# Patient Record
Sex: Male | Born: 1960 | Race: Asian | Hispanic: No | Marital: Married | Smoking: Never smoker
Health system: Western US, Academic
[De-identification: ages and names within clinical notes are randomized; demographics above are authoritative.]

## PROBLEM LIST (undated history)

## (undated) DIAGNOSIS — I209 Angina pectoris, unspecified: Secondary | ICD-10-CM

## (undated) DIAGNOSIS — H547 Unspecified visual loss: Secondary | ICD-10-CM

## (undated) DIAGNOSIS — R519 Headache, unspecified: Secondary | ICD-10-CM

## (undated) DIAGNOSIS — G4730 Sleep apnea, unspecified: Secondary | ICD-10-CM

## (undated) DIAGNOSIS — I639 Cerebral infarction, unspecified: Secondary | ICD-10-CM

## (undated) DIAGNOSIS — E785 Hyperlipidemia, unspecified: Secondary | ICD-10-CM

## (undated) DIAGNOSIS — R42 Dizziness and giddiness: Secondary | ICD-10-CM

## (undated) DIAGNOSIS — Z8673 Personal history of transient ischemic attack (TIA), and cerebral infarction without residual deficits: Secondary | ICD-10-CM

## (undated) DIAGNOSIS — E119 Type 2 diabetes mellitus without complications: Secondary | ICD-10-CM

## (undated) DIAGNOSIS — I10 Essential (primary) hypertension: Secondary | ICD-10-CM

## (undated) HISTORY — DX: Essential (primary) hypertension: I10

## (undated) HISTORY — DX: Sleep apnea, unspecified: G47.30

## (undated) HISTORY — PX: OTHER PROCEDURE: U1053

## (undated) HISTORY — DX: Dizziness and giddiness: R42

## (undated) HISTORY — DX: Personal history of transient ischemic attack (TIA), and cerebral infarction without residual deficits: Z86.73

## (undated) HISTORY — DX: Angina pectoris, unspecified (CMS-HCC): I20.9

## (undated) HISTORY — DX: Hyperlipidemia, unspecified: E78.5

## (undated) HISTORY — DX: Type 2 diabetes mellitus without complications (CMS-HCC): E11.9

## (undated) HISTORY — DX: Cerebral infarction, unspecified (CMS-HCC): I63.9

## (undated) HISTORY — DX: Unspecified visual loss: H54.7

## (undated) HISTORY — DX: Headache, unspecified: R51.9

## (undated) MED ORDER — PERFLUTREN LIPID MICROSPHERE 1.1 MG/ML IV SUSP
0.40 mL | Freq: Once | INTRAVENOUS | Status: AC
Start: 2021-03-13 — End: 2021-03-13

## (undated) MED ORDER — DEXTROSE-NACL 5-0.9 % IV SOLN (CUSTOM)
INTRAVENOUS | Status: AC
Start: 2021-03-27 — End: ?

## (undated) MED ORDER — HYDRALAZINE HCL 100 MG OR TABS
100.0000 mg | ORAL_TABLET | Freq: Three times a day (TID) | ORAL | 3 refills | Status: AC
Start: 2021-06-19 — End: 2022-06-14

## (undated) MED ORDER — METOPROLOL SUCCINATE 50 MG OR TB24
50.0000 mg | ORAL_TABLET | Freq: Every day | ORAL | 3 refills | Status: AC
Start: 2021-04-17 — End: ?

## (undated) MED ORDER — SODIUM CHLORIDE FLUSH 0.9 % IV SOLN
10.00 mL | Freq: Once | INTRAVENOUS | Status: AC
Start: 2021-03-13 — End: 2021-03-13

## (undated) MED ORDER — CLONIDINE HCL 0.1 MG OR TABS
0.1000 mg | ORAL_TABLET | Freq: Every day | ORAL | 3 refills | Status: AC
Start: 2021-06-19 — End: 2022-06-14

## (undated) MED ORDER — DEXTROSE-NACL 5-0.45 % IV SOLN (CUSTOM)
INTRAVENOUS | Status: AC
Start: 2021-03-27 — End: ?

---

## 2020-11-06 ENCOUNTER — Inpatient Hospital Stay: Admit: 2020-11-06 | Discharge: 2020-11-06 | Disposition: A | Discharge status: Home Health | Payer: Self-pay

## 2020-12-19 NOTE — Progress Notes (Signed)
Endocrine Surgery    New Patient Consultation      Patient Name: Keith Cross   MRN: [id number]  Date of Evaluation: 12/21/2020   Referring Provider: Dr. Glenford Cross (PCP)    [name] translation provided by Keith Cross, Keith Cross    Subjective    Reason For Visit: Left adrenal mass, poorly controlled hypertension    Chief Complaint: Left adrenal mass    History of Present Illness:  Keith Cross is a 60 year old male with PMH significant for hypertension, hyperlipidemia, Type 2 DM, hx of CVA in 2015.    Presents for evaluation of a left adrenal mass. Noted by his PCP to have very poorly controlled HTN. CTAP demonstrated a 0.9 cm left adrenal mass that appears c/w benign adenoma. Biochemical workup concerning for possible pheochromocytoma, with plasma normetanephrines 252 (normal < 148) and metanephrines 63 (< 57). Aldosterone level 8.     Current BP regimen per PCP office note includes:  Clonidine 0.1 mg TID  Coreg 25 mg BID  Hydralazine 50 mg TID  Isosorbide mononitrate 30 mg daily  Lasix 40 mg daily  Losartan 100 mg daily  Metoprolol ER 50 mg daily  Norvasc 5 mg daily    Patient appears to be a very poor historian and is unaccompanied for the visit. He states he is only on 5 medications total, despite a much more extensive medication list per his PCP note. He reports a history of intermittent chest pain, shortness of breath, palpitations, headaches, sweating. Denies abdominal pain.    He also reports a history of a thyroid nodule that was diagnosed when he lived in [place] He states he was given medication but since moving to the [place], his thyroid has not been checked and he is not on any medication.      Review of Systems:  A complete 14 point review of systems is negative other than as stated in the HPI    Past Medical History:   Past Medical History:   Diagnosis Date   . History of CVA (cerebrovascular accident)    . Hyperlipidemia    . Hypertension    . Type 2 diabetes mellitus (CMS-HCC)        Past Surgical  History: *  Past Surgical History:   Procedure Laterality Date   . Left knee surgery         Adverse Drugs Reactions: No Known Allergies    Home Medications:  Current Outpatient Medications   Medication Sig   . dexAMETHasone (DECADRON) 1 MG tablet Take 1 tablet (1 mg) by mouth once for 1 dose. Please take 1 tablet (1 mg) by mouth at 11 pm on the evening before checking cortisol level   . losartan (COZAAR) 25 MG tablet Take 25 mg by mouth daily.   . metoprolol succinate (TOPROL XL) 50 MG XL tablet Take 50 mg by mouth daily.     No current facility-administered medications for this visit.       Social History:  - ETOH: denies  - Tobacco use: denies  - Recreational drug use: denies     Family History:  - Non contributory      Objective   VITAL SIGNS:  BP (!) 181/122 (BP Location: Right arm, BP Patient Position: Sitting, BP cuff size: Regular)   Pulse 79   Temp 97.1 F (36.2 C) (Tympanic)   Resp 18   Ht 5' 6.93" (1.7 m)   Wt 81.2 kg (179 lb 0.2 oz)  BMI 28.10 kg/m   Body mass index is 28.1 kg/m.    Physical Exam   GEN: no apparent distress, responds appropriately  HEENT: mucous membranes moist, no scleral icterus  NECK: no JVD, trachea midline  RESPIRATORY: No increased work of breathing, regular rate  CV: Normal rate, regular rhythm  ABDOMEN: Soft, non-distended, nontender to palpation without rebound tenderness or guarding.  MSK: Warm, no lower extremity edema. UE/LE distal pulses intact bilaterally   SKIN: No rashes, ecchymosis, or skin lesions  NEURO: Alert and oriented to person, place, time, and events.   PSYCH: Normal affect and mood      RELEVANT DIAGNOSTICS  LAB RESULTS    Labs from 10/15/20 (results scanned in Media)    Plasma metanephrines 63 (< 57)  Plasma normetanephrines 252 (< 140)    Aldosterone 8  Potassium 4.1      IMAGING     CTAP Adrenal Protocol 11/06/20 at [facility] MRI (report scanned in Media)    Within the left adrenal gland at the junction of the medial and lateral limbs there is 1 cm oval  nodule with Hounsfield units of 28 on the precontrast sequence image 62/85. ON the portal venous phase this nodule has Hounsfield  Units of 66 on image 58/210. This has Hounsfield units of 38 on the 15 minute delayed sequence image 148/210. The absolute washout percentage within this nodule is 74% and the relative contrast washout percentage is 42%. These washout measurements are consistent with a benign left adrenal adenoma.    The remainder of the left adrenal gland is normal.    The right adrenal gland is normal.    There is mild diffuse fatty liver. There is no suspicious contrast enhancing liver lesion.    There is a 0.4 cm nonobstructing right upper pole renal calculus.     There are no enlarged abdominal or retroperitoneal lymph nodes.    Impression:  1. There is a 0.9 cm benign left adrenal adenoma  2. Fatty liver  3. There is a 0.4 cm non-obstructing right renal calculus      PATHOLOGY     N/A    Other labs and imaging reviewed. Pertinents mentioned in assessment/plan  Assessment/Plan:   In summary, Keith Cross is a 60 year old male with PMH significant for hypertension, hyperlipidemia, Type 2 DM, hx of CVA in 2015.    Presents for evaluation of a left adrenal mass concerning for pheochromocytoma in the context of poorly controlled hypertension. Unclear at this point which antihypertensives the patient is taking.      PLAN  - Order repeat plasma metanephrines/normetanephrines and catecholamines, along with 24 hour urine metanephrines and catecholamines  - Complete adrenal biochemical workup with plasma renin, aldosterone and 1 mg dexamethasone suppression test  - Will order thyroid US and thyroid function tests to assess for thyroid nodule per his reported history  - Check calcium/PTH  - Referral to endocrinology to assist with workup  - May require referral to cardiology due to poorly controlled hypertension  - Asked patient to please follow up with PCP to verify which blood pressure medications he should  be taking  - Follow up with me in 1 month after all tests have been completed    ____________________  Keith Fermo, MD, FACS  Assistant Clinical Professor of Surgery  [facility] [employer] [facility]

## 2020-12-21 ENCOUNTER — Ambulatory Visit: Payer: BC Managed Care – PPO | Attending: Surgery | Admitting: Surgery

## 2020-12-21 ENCOUNTER — Other Ambulatory Visit: Payer: Self-pay | Admitting: Surgery

## 2020-12-21 ENCOUNTER — Encounter: Payer: Self-pay | Admitting: Surgery

## 2020-12-21 VITALS — BP 181/122 | HR 79 | Temp 97.1°F | Resp 18 | Ht 66.93 in | Wt 179.0 lb

## 2020-12-21 DIAGNOSIS — Z6828 Body mass index (BMI) 28.0-28.9, adult: Secondary | ICD-10-CM

## 2020-12-21 DIAGNOSIS — E278 Other specified disorders of adrenal gland: Secondary | ICD-10-CM | POA: Insufficient documentation

## 2020-12-21 DIAGNOSIS — E041 Nontoxic single thyroid nodule: Secondary | ICD-10-CM | POA: Insufficient documentation

## 2020-12-21 MED ORDER — METOPROLOL SUCCINATE 50 MG OR TB24
50.00 mg | ORAL_TABLET | Freq: Every day | ORAL | Status: DC
Start: 2020-11-20 — End: 2021-03-13

## 2020-12-21 MED ORDER — DEXAMETHASONE 1 MG OR TABS
1.0000 mg | ORAL_TABLET | Freq: Once | ORAL | 0 refills | Status: AC
Start: 2020-12-21 — End: 2020-12-21

## 2020-12-21 MED ORDER — LOSARTAN POTASSIUM 25 MG OR TABS
25.00 mg | ORAL_TABLET | Freq: Every day | ORAL | Status: DC
Start: 2020-11-29 — End: 2021-01-23

## 2020-12-24 ENCOUNTER — Other Ambulatory Visit
Admission: RE | Admit: 2020-12-24 | Discharge: 2020-12-24 | Disposition: A | Discharge status: Home / Self Care | Payer: BC Managed Care – PPO | Attending: Surgery | Admitting: Surgery

## 2020-12-24 DIAGNOSIS — E278 Other specified disorders of adrenal gland: Secondary | ICD-10-CM | POA: Insufficient documentation

## 2020-12-24 LAB — METANEPHRINES FRACTIONATED, TIMED URINE
Metanephrine Urine Hours Collected (Hr): 24
Metanephrine Urine Total Volume: 1970

## 2020-12-24 LAB — CATECHOLAMINES PANEL, TIMED URINE FREE

## 2020-12-25 ENCOUNTER — Other Ambulatory Visit
Admission: RE | Admit: 2020-12-25 | Discharge: 2020-12-25 | Disposition: A | Discharge status: Home / Self Care | Payer: BC Managed Care – PPO | Attending: Surgery | Admitting: Surgery

## 2020-12-25 DIAGNOSIS — E278 Other specified disorders of adrenal gland: Secondary | ICD-10-CM | POA: Insufficient documentation

## 2020-12-25 DIAGNOSIS — E041 Nontoxic single thyroid nodule: Secondary | ICD-10-CM | POA: Insufficient documentation

## 2020-12-25 LAB — CBC WITH DIFF, BLOOD
ANC automated: 6.6 10*3/uL (ref 2.0–8.1)
Basophils %: 0.5 %
Basophils Absolute: 0 10*3/uL (ref 0.0–0.2)
Eosinophils %: 0.2 %
Eosinophils Absolute: 0 10*3/uL (ref 0.0–0.5)
Hematocrit: 48.4 % (ref 39.5–50.0)
Hgb: 16.2 G/DL (ref 13.5–16.9)
Lymphocytes %: 12.9 %
Lymphocytes Absolute: 1 10*3/uL (ref 0.9–3.3)
MCH: 28.8 PG (ref 27.0–33.5)
MCHC: 33.4 G/DL (ref 32.0–35.5)
MCV: 86 FL (ref 81.5–97.0)
MPV: 7.8 FL (ref 7.2–11.7)
Monocytes %: 2.4 %
Monocytes Absolute: 0.2 10*3/uL (ref 0.0–0.8)
Neutrophils % (A): 84 %
PLT Count: 247 10*3/uL (ref 150–400)
RBC: 5.63 10*6/uL — ABNORMAL HIGH (ref 4.38–5.62)
RDW-CV: 14.3 % (ref 11.6–14.4)
White Bld Cell Count: 7.8 10*3/uL (ref 4.0–10.5)

## 2020-12-25 LAB — BASIC METABOLIC PANEL, BLOOD
BUN: 18 mg/dL (ref 7–25)
CO2: 23 mmol/L (ref 21–31)
Calcium: 8.9 mg/dL (ref 8.6–10.3)
Chloride: 104 mmol/L (ref 98–107)
Creat: 1.1 mg/dL (ref 0.7–1.3)
Electrolyte Balance: 10 mmol/L (ref 2–12)
Glucose: 146 mg/dL — ABNORMAL HIGH (ref 85–125)
Potassium: 4.1 mmol/L (ref 3.5–5.1)
Sodium: 137 mmol/L (ref 136–145)
eGFR - high estimate: >60 (ref >59)
eGFR - low estimate: >60 (ref >59)

## 2020-12-25 LAB — PTH INTACT, BLOOD: PTH, Intact: 67 pg/mL (ref 10–68)

## 2020-12-25 LAB — CORTISOL, BLOOD: Cortisol: 1 ug/dL

## 2020-12-25 LAB — THYROID FUNCTION PANEL (ULTRASENSITIVE TSH + FREE T4)
Free T4: 0.95 ng/dL (ref 0.60–1.12)
TSH, Ultrasensitive: 0.493 u[IU]/mL (ref 0.450–4.120)

## 2020-12-26 LAB — CATECHOLAMINES PANEL, TIMED URINE FREE
Catechol Fract UR Creat mg/Day: 1911 mg/d (ref 800–2100)
Catechol Fract UR Dopamine Vol ug/L: 168 ug/L
Catechol Fract UR Dopamine ug/Day: 331 ug/d (ref 71–485)
Catechol Fract UR Dopamine ug/g Creat: 173 ug/g CRT (ref 0–250)
Catechol Fract UR Epineph Vol ug/L: 3 ug/L
Catechol Fract UR Epineph ug/Day: 6 ug/d (ref 1–14)
Catechol Fract UR Epineph ug/g Creat: 3 ug/g CRT (ref 0–20)
Catechol Fract UR Time, Hr: 24 hr
Catechol Fract Urine Total Volume, ml: 1970 mL
Norepinephrine Urine Volume (ug/L): 46 ug/L
Norepinephrine Urine ug/Day: 91 ug/d (ref 14–120)

## 2020-12-27 ENCOUNTER — Telehealth: Payer: Self-pay | Admitting: Surgery

## 2020-12-27 LAB — METANEPHRINES FRACTIONATED, TIMED URINE
Metanephrine Urine per 24 hr: 173 ug/d (ref 55–320)
Metanephrine Urine per Volume: 88 ug/L
Metanephrine/Creatinine Ratio: 91 ug/g CRT (ref 0–300)
Normetanephrine Urine per Vol: 294 ug/L
Normetanephrine/Creatinine Ratio: 303 ug/g CRT (ref 0–400)

## 2020-12-27 NOTE — Telephone Encounter (Signed)
Pt's daughter states she would like a call regarding pt's consultation, states pt didn't completely understand everything so she would like to speak with nurse, please call to further assist, thank you.

## 2020-12-28 ENCOUNTER — Telehealth: Payer: Self-pay | Admitting: Surgery

## 2020-12-28 LAB — METANEPHRINES, PLASMA
Metanephrine,Free: 0.36 nmol/L (ref <0.50)
Normetanephrine,Free: 0.98 nmol/L — ABNORMAL HIGH (ref <0.90)

## 2020-12-28 LAB — ALDOSTERONE, BLOOD: Aldosterone: 5 ng/dL

## 2020-12-28 LAB — RENIN, BLOOD: Renin: 0.8 ng/mL/hr

## 2020-12-28 NOTE — Telephone Encounter (Signed)
Called back and talked to pt's daughter, Sonia Baller.  Let her know that pt will need thyroid u/s and an appt with Endocrinologist.  She said already has an appt with Endo.  Gave phone number to call for u/s appt.  She wants to sign up to My Chart.  Resend the activation code.  Answered all questions and concerned.  vp

## 2020-12-28 NOTE — Telephone Encounter (Signed)
Caller states they are returning Van's call. Please call back to assist. Thanks

## 2020-12-28 NOTE — Telephone Encounter (Signed)
Left message on pt's daughter voice mail to call office back again.  vp

## 2020-12-30 LAB — MISCELLANEOUS TEST (ARUP): ARUP Test Code: 2003248

## 2020-12-30 LAB — CATECHOLAMINES, BLOOD
Catechol Dopamine: <20 pg/mL (ref 0–20)
Catechol Epinephrine: 50 pg/mL (ref 10–200)
Catechol Norepinephrine: 748 pg/mL — ABNORMAL HIGH (ref 80–520)

## 2021-01-15 ENCOUNTER — Telehealth: Payer: Self-pay | Admitting: Surgery

## 2021-01-15 NOTE — Telephone Encounter (Signed)
Called and left message on voice mail for pt. Will need to r/s this Friday appt with Dr Ronny Bacon to next week.  Dr Ronny Bacon needs to see the thyroid u/s result prior to see pt.  vp

## 2021-01-18 ENCOUNTER — Ambulatory Visit: Payer: BC Managed Care – PPO | Admitting: Surgery

## 2021-01-20 ENCOUNTER — Ambulatory Visit
Admission: RE | Admit: 2021-01-20 | Discharge: 2021-01-20 | Disposition: A | Discharge status: Home / Self Care | Payer: BC Managed Care – PPO | Attending: Surgery | Admitting: Surgery

## 2021-01-20 DIAGNOSIS — E041 Nontoxic single thyroid nodule: Secondary | ICD-10-CM

## 2021-01-22 NOTE — Progress Notes (Signed)
Endocrine Surgery Follow Up Visit    Visit Date:  01/23/2021      Keith Cross, LVN is present to assist with [name] interpretation      HPI:  Keith Cross is a 60 year old year old male with PMH significant for hypertension, hyperlipidemia, Type 2 DM, hx of CVA in 2015.    Presents for follow up of a left adrenal mass in the context of poorly controlled hypertension. CTAP demonstrated a 0.9 cm left adrenal mass that appears c/w benign adenoma. Biochemical workup concerning for possible pheochromocytoma, with plasma normetanephrines 252 (normal < 148) and metanephrines 63 (< 57). Aldosterone level 8.     Current BP regimen per PCP office note includes:  Clonidine 0.1 mg TID  Coreg 25 mg BID  Hydralazine 50 mg TID  Isosorbide mononitrate 30 mg daily  Lasix 40 mg daily  Losartan 100 mg daily  Metoprolol ER 50 mg daily  Norvasc 5 mg daily    Patient is a very poor historian with poor recollection of the medications he is taking.    Complete biochemical workup ordered by me as follows:    Aldosterone  5  Renin   0.8  Aldo:renin ratio 6.25    '1mg'$  dexamethasone suppression  8am cortisol  1  Dexamethasone level 457 (140-295)    Plasma metenephrines 0.36 (< 0.5)  Plasma normetanephrines 0.98 (< 0.9)  Plasma norepinephrine 748 (80-520)    24 hour urine metanephrines 173 (55-320)  24 hour urine normetanephrines 579 (341-937)      Physical Exam:  Vital Signs:  BP (!) 188/113 (BP Location: Left arm, BP Patient Position: Sitting, BP cuff size: Regular)   Pulse 70   Temp 95.7 F (35.4 C) (Tympanic)   Resp 17   Ht 5' 6.93" (1.7 m)   Wt 81.4 kg (179 lb 7.3 oz)   BMI 28.17 kg/m     General: no acute distress  CV: RRR  Pulm: non-labored breathing  Abd: soft, NT/ND  Neuro: no focal deficits      Labs:       Ref. Range 12/25/2020 07:32   Free T4 Latest Ref Range: 0.60 - 1.12 ng/dL 0.95   TSH Latest Ref Range: 0.450 - 4.120 uIU/mL 0.493   Aldosterone, Blood Latest Units: ng/dL 5.0   Cortisol Latest Units: MCG/DL 1   Epinephrine  Latest Ref Range: 10 - 200 pg/mL 50   Metanephrine,Free Latest Ref Range: <0.50 nmol/L 0.36   Norepinephrine Latest Ref Range: 80 - 520 pg/mL 748 (H)   Normetanephrine,Free Latest Ref Range: <0.90 nmol/L 0.98 (H)   PTH Intact Latest Ref Range: 10 - 68 pg/mL 67   Renin Latest Units: ng/mL/hr 0.8     Comment: (NOTE)   Test name          Result Flag Units RefIntvl   ------------------------------------------------------------   Dexamethasone, Serum or Plasma, LC-MS/MS                 457.5    ng/dL        INTERPRETIVE INFORMATION: Dexamethasone, Serum or Plasma by                 LC-MS/MS   Adults baseline: Less than 50 ng/dL   8:00 AM draw following 1 mg dexamethasone between 11:00 pm   and 12:00 am the previous evening: 140 - 295 ng/dL   8:00 AM draw following 8 mg dexamethasone (4 x 2 mg doses)   between 11:00 pm and  12:00 am the previous evening: 1600 -   2850 ng/dL         Ref. Range 12/24/2020 09:30   Metanephrine Per Volume Latest Units: ug/L 88   Metanephrine Per 24H Latest Ref Range: 55 - 320 ug/d 173   Normetanephrine Per Volume Latest Units: ug/L 294   Normetanephrine Per 24H Latest Ref Range: 114 - 865 ug/d 579   Creatinine, Urine - per 24hr Latest Ref Range: 800 - 2,100 mg/d 1,911          Ref. Range 12/25/2020 07:32   Sodium Latest Ref Range: 136 - 145 mmol/L 137   Potassium Latest Ref Range: 3.5 - 5.1 mmol/L 4.1   Chloride Latest Ref Range: 98 - 107 mmol/L 104   CO2 Latest Ref Range: 21 - 31 mmol/L 23   Anion Gap Latest Ref Range: 2 - 12 mmol/L 10   BUN Latest Ref Range: 7 - 25 mg/dL 18   Creatinine Latest Ref Range: 0.7 - 1.3 mg/dL 1.1   GFR Latest Ref Range: >59  >60   eGFR - high estimate Latest Ref Range: >59  >60   Glucose Latest Ref Range: 85 - 125 mg/dL 146 (H)   Calcium Latest Ref Range: 8.6 - 10.3 mg/dL 8.9       Imaging:      CTAP Adrenal Protocol 11/06/20 at [facility] MRI (report scanned in Media)    Within the left adrenal gland at the junction of the  medial and lateral limbs there is 1 cm oval nodule with Hounsfield units of 28 on the precontrast sequence image 62/85. ON the portal venous phase this nodule has Hounsfield  Units of 66 on image 58/210. This has Hounsfield units of 38 on the 15 minute delayed sequence image 148/210. The absolute washout percentage within this nodule is 74% and the relative contrast washout percentage is 42%. These washout measurements are consistent with a benign left adrenal adenoma.    The remainder of the left adrenal gland is normal.    The right adrenal gland is normal.    There is mild diffuse fatty liver. There is no suspicious contrast enhancing liver lesion.    There is a 0.4 cm nonobstructing right upper pole renal calculus.     There are no enlarged abdominal or retroperitoneal lymph nodes.    Impression:    1. There is a 0.9 cm benign left adrenal adenoma  2. Fatty liver  3. There is a 0.4 cm non-obstructing right renal calculus      Assessment:  Keith Cross is a 60 year old year old male PMH significant for hypertension, hyperlipidemia, Type 2 DM, hx of CVA in 2015.    Presents for follow up of a left adrenal mass in the context of poorly controlled hypertension. CTAP with a 0.9 cm benign left adrenal adenoma.    Initial concern for pheochromocytoma given mild elevation of plasma metanephrines and normetanephrines. Repeat biochemical workup does not demonstrate any evidence of adrenal hypersecretion at this time.    Aldo:renin ratio 6.25 (< 30)  $Re'1mg'CXj$  dexamethasone suppression 8 am cortisol: 1  Plasma normetaneprhines very mildly elevated at 0.98 (normal < 0.9)  Plasma metaneprhines 0.36 (< 0.5)    24 hour urine metanephrines and normetaneprhines WNL      Plan:    1. Labs reviewed with patient  2. At this time, there does not appear to be evidence of adrenal hypersecretion   3. Given sub-centimeter size of adrenal nodule and lack  of excess hormone secretion, currently no surgical indication for  adrenalectomy  4. Follow up scheduled with Keith Cross of endocrinology tomorrow, would likely require yearly biochemical workup/imaging for continued surveillance of adrenal nodule  5. Refer to cardiology to resistant hypertension  6. Follow up with me in endocrine surgery PRN      Gavriel Holzhauer L. Ronny Bacon, MD  Assistant Clinical Professor of [employer] [facility]

## 2021-01-23 ENCOUNTER — Encounter: Payer: Self-pay | Admitting: Surgery

## 2021-01-23 ENCOUNTER — Ambulatory Visit: Payer: BC Managed Care – PPO | Attending: Surgery | Admitting: Surgery

## 2021-01-23 VITALS — BP 188/113 | HR 70 | Temp 95.7°F | Resp 17 | Ht 66.93 in | Wt 179.5 lb

## 2021-01-23 DIAGNOSIS — I10 Essential (primary) hypertension: Secondary | ICD-10-CM | POA: Insufficient documentation

## 2021-01-23 DIAGNOSIS — E278 Other specified disorders of adrenal gland: Secondary | ICD-10-CM | POA: Insufficient documentation

## 2021-01-23 DIAGNOSIS — Z6828 Body mass index (BMI) 28.0-28.9, adult: Secondary | ICD-10-CM

## 2021-01-23 MED ORDER — AMLODIPINE 5 MG OR TABS
5.00 mg | ORAL_TABLET | Freq: Every day | ORAL | Status: DC
Start: ? — End: 2021-03-13

## 2021-01-23 MED ORDER — PIOGLITAZONE HCL 30 MG OR TABS
30.00 mg | ORAL_TABLET | Freq: Every day | ORAL | Status: DC
Start: ? — End: 2021-04-11

## 2021-01-23 MED ORDER — ASPIRIN 81 MG OR TBEC: 81.00 mg | DELAYED_RELEASE_TABLET | Freq: Every day | ORAL | Status: AC

## 2021-01-23 MED ORDER — LOSARTAN POTASSIUM 100 MG OR TABS
100.00 mg | ORAL_TABLET | Freq: Every day | ORAL | Status: DC
Start: ? — End: 2021-06-04

## 2021-01-23 MED ORDER — FUROSEMIDE 40 MG OR TABS: 40.00 mg | ORAL_TABLET | Freq: Every day | ORAL | Status: AC

## 2021-01-23 MED ORDER — POTASSIUM CHLORIDE CR 10 MEQ OR TBCR
20.00 meq | EXTENDED_RELEASE_TABLET | Freq: Every day | ORAL | Status: DC
Start: ? — End: 2021-03-28

## 2021-01-23 MED ORDER — ATORVASTATIN CALCIUM 40 MG OR TABS
40.00 mg | ORAL_TABLET | Freq: Every day | ORAL | Status: DC
Start: ? — End: 2021-03-28

## 2021-01-23 MED ORDER — ISOSORBIDE MONONITRATE CR 30 MG OR TB24
30.00 mg | ORAL_TABLET | Freq: Every morning | ORAL | Status: DC
Start: ? — End: 2021-04-11

## 2021-01-23 MED ORDER — COLCHICINE 0.6 MG OR TABS
0.60 mg | ORAL_TABLET | Freq: Two times a day (BID) | ORAL | Status: DC
Start: ? — End: 2022-01-13

## 2021-01-23 NOTE — Addendum Note (Signed)
Addended by: Kemper Durie L on: 01/23/2021 10:58 AM     Modules accepted: Orders

## 2021-01-24 ENCOUNTER — Ambulatory Visit
Payer: BC Managed Care – PPO | Attending: Surgery | Admitting: Student in an Organized Health Care Education/Training Program

## 2021-01-24 ENCOUNTER — Other Ambulatory Visit
Admission: RE | Admit: 2021-01-24 | Discharge: 2021-01-24 | Disposition: A | Discharge status: Home Health | Payer: BC Managed Care – PPO | Source: Ambulatory Visit

## 2021-01-24 ENCOUNTER — Encounter: Payer: Self-pay | Admitting: Student in an Organized Health Care Education/Training Program

## 2021-01-24 VITALS — BP 181/108 | HR 83 | Resp 16 | Ht 67.0 in | Wt 177.5 lb

## 2021-01-24 DIAGNOSIS — R7989 Other specified abnormal findings of blood chemistry: Secondary | ICD-10-CM

## 2021-01-24 DIAGNOSIS — Z6827 Body mass index (BMI) 27.0-27.9, adult: Secondary | ICD-10-CM | POA: Insufficient documentation

## 2021-01-24 DIAGNOSIS — E119 Type 2 diabetes mellitus without complications: Secondary | ICD-10-CM

## 2021-01-24 DIAGNOSIS — R519 Headache, unspecified: Secondary | ICD-10-CM

## 2021-01-24 DIAGNOSIS — G8929 Other chronic pain: Secondary | ICD-10-CM

## 2021-01-24 DIAGNOSIS — D3500 Benign neoplasm of unspecified adrenal gland: Secondary | ICD-10-CM | POA: Insufficient documentation

## 2021-01-24 NOTE — Consults (Signed)
ENDOCRINOLOGY OUTPATIENT VISIT - [facility]    Date and Time of Evaluation:  Thursday January 24, 2021    Primary Care Physician:  Keith Cross [facility]     Chief Complaint:   Chief Complaint   Patient presents with   . New Patient       History of Present Illness:    Keith Cross is a 60 year old male with CVA 2015, HTN, HLD, and T2DM, here for evaluation and management of adrenal nodule.    1. Adrenal nodule.    Subcentimeter adrenal nodule (0.9 cm) on 10/2020 CTAP with 28 HU and absolute/relative contrast washout of 74%/42%, c/w benign adrenal adenoma.     Was seen by Dr. Inetta Cross of Endocrine Surgery given history of uncontrolled hypertension and previous hormonal w/up with elevated plasma metanephrines 62 (< 57) and normetanephrines 252 (< 148), though less than 2x ULN. Aldosterone level was normal at 8. Repeat labs with Dr. Ronny Cross with unremarkable plasma metanephrines (normetanephrines very minimally elevated at 0.98, normal < 0.9). 24-hour urine metanephrines were wnl. Aldosterone/renin wnl with normal dexamethasone suppression test.      Currently on multiple hypertensive agents, including clonidine, carvedilol, hydralazine, isosorbide mononitrate, amlodipine, losartan, metoprolol, and furosemide. BP 181/108 in clinic today.     Patient does endorse headaches in the morning and late evening. He does not have a Neurologist. Also endorses episodes of palpitations, diaphoresis, SOB, and chest pain. Advised patient he needs to present to ER if he has these symptoms. Has Cardiology referral. Reports diarrhea with mucus in stool once every few weeks.     Also had thyroid US, read pending.      Past Medical/Surgical History:  Past Medical History:   Diagnosis Date   . History of CVA (cerebrovascular accident)    . Hyperlipidemia    . Hypertension    . Type 2 diabetes mellitus (CMS-HCC)        Family History:  No family history on file.    Social History:  Social History     Tobacco Use   . Smoking status: Never  Smoker   . Smokeless tobacco: Never Used   Substance Use Topics   . Alcohol use: Not on file   . Drug use: Not on file       Allergies:  No Known Allergies    Medications:  Current Outpatient Medications on File Prior to Visit   Medication Sig Dispense Refill   . amLODIPINE (NORVASC) 5 MG tablet Take 5 mg by mouth daily.     Marland Kitchen aspirin 81 MG EC tablet Take 81 mg by mouth daily.     Marland Kitchen atorvastatin (LIPITOR) 40 MG tablet Take 40 mg by mouth daily.     . colchicine 0.6 MG tablet Take 0.6 mg by mouth 2 times daily.     . furosemide (LASIX) 40 MG tablet Take 40 mg by mouth daily.     . isosorbide mononitrate (IMDUR) 30 MG Controlled-Release tablet Take 30 mg by mouth every morning.     Marland Kitchen losartan (COZAAR) 100 MG tablet Take 100 mg by mouth daily.     . [DISCONTINUED] losartan (COZAAR) 25 MG tablet Take 25 mg by mouth daily.     . metoprolol succinate (TOPROL XL) 50 MG XL tablet Take 50 mg by mouth daily.     . pioglitazone (ACTOS) 30 mg tablet Take 30 mg by mouth daily.     . potassium chloride (K-DUR) 10 MEQ Sustained-Release tablet Take  20 mEq by mouth daily.       No current facility-administered medications on file prior to visit.       Physical Exam:  Vital Signs: BP (!) 181/108   Pulse 83   Resp 16   Ht 5\' 7"  (1.702 m)   Wt 80.5 kg (177 lb 7.5 oz)   BMI 27.80 kg/m     General:  No acute distress  HEENT: No proptosis, lid lag, or periorbital edema. No scleral icterus   Lungs: Symmetric chest rise, normal work of breathing  Heart: Regular rate  Extremities: No clubbing, cyanosis, or edema  Neurological: Alerted and oriented x 4  Skin:  No visible lesions  Psych: Appropriate mood and affect    Diagnostic Data:    Laboratory Data:    Lab Results   Component Value Date    HGB 16.2 12/25/2020    HCT 48.4 12/25/2020    PLT 247 12/25/2020    RBC 5.63 (H) 12/25/2020    MCV 86.0 12/25/2020    RDW 14.3 12/25/2020    NEUTP 84.0 12/25/2020    NEUTAB 6.6 12/25/2020    EOSAB 0.0 12/25/2020       Lab Results   Component Value  Date/Time    SODIUM 137 12/25/2020 07:32 AM    K 4.1 12/25/2020 07:32 AM    CL 104 12/25/2020 07:32 AM    CO2 23 12/25/2020 07:32 AM    BUN 18 12/25/2020 07:32 AM    CREAT 1.1 12/25/2020 07:32 AM    GLU 146 (H) 12/25/2020 07:32 AM    Keith Cross 8.9 12/25/2020 07:32 AM       No results found for: A1C    Lab Results   Component Value Date/Time    TSHHS 0.493 12/25/2020 07:32 AM    FREET4 0.95 12/25/2020 07:32 AM       Impression:  Keith Cross is a 60 year old male with CVA 2015, HTN, HLD, and T2DM, here for evaluation and management of adrenal nodule.    #Adrenal nodule. 0.9 cm with negative hormonal workup (normal aldosterone/renin, DST, and minimally elevated plasma normetanephrines <2x ULN) in 12/2020 with Dr. Inetta Cross. Subcentimeter adrenal nodules do not typically require surveillance, however given nearly 1 cm size will plan to have patient RTC next year for repeat CT scan +/- hormonal testing depending on whether there is a change in size. If stable does not require regular monitoring.     #HTN. Refractory despite multiple antihypertensives with BP 181/108 in clinic today. Negative workup for hyperaldosteronism and pheochromocytoma. Has Cardiology referral    #Headaches. Possibly in setting of uncontrolled HTN, however will place referral to Neurology as below    #Episodic chest pain, diaphoresis, palpitations. Discussed that he needs to present to ER if he experiences these symptoms. Has Cardiology referral.     Plan:  Patient Instructions   1. Labs today  2. Make appointment with Neurology (referral placed)  3. Make appointment with Cardiology (referral placed yesterday by Dr. Ronny Cross)  4. Return to clinic in 1 year    Vernell Leep, MD

## 2021-01-24 NOTE — Patient Instructions (Addendum)
1. Labs today  2. Make appointment with Neurology (referral placed)  3. Make appointment with Cardiology (referral placed yesterday by Dr. Ronny Bacon)  4. Return to clinic in 1 year

## 2021-01-25 LAB — GLYCOSYLATED HGB(A1C), BLOOD: Glycated Hgb, A1C: 7.4 % — ABNORMAL HIGH (ref 4.6–5.6)

## 2021-01-30 LAB — METANEPHRINES, PLASMA
Metanephrine,Free: 0.28 nmol/L (ref <0.50)
Normetanephrine,Free: 0.45 nmol/L (ref <0.90)

## 2021-02-01 ENCOUNTER — Encounter: Payer: Self-pay | Admitting: Student in an Organized Health Care Education/Training Program

## 2021-03-12 NOTE — Progress Notes (Signed)
[facility] Note    Patient: Keith Cross  Patient DOB: 11-21-1960  Patient ID: [id number]  Patient PCP: Emmaline Kluver  Date of Service: 03/13/2021    Dr. Emmaline Kluver, as you know,    Reason for Visit  New Patient       HPI  Keith Cross is a 59 year old male    Interval History  Patient presents for initial cardiology clinic visit.  He is here for evaluation of hypertension.  He reports that he has a longstanding history of hypertension.  He currently is on at least for medications and his blood pressures are still uncontrolled in the [id number] to [id number] systolic at home.  Denies any specific cardiac history but was told in [place] where he lived before, that the blood was not flowing well through 1 of his heart valves.  He denies ever being told that he needed surgery or valve replacement.  He also underwent a stress test before perhaps 10 years ago which he reports were normal.  He has never undergone angiogram.  He has a history of sleep apnea but his CPAP machine is from [place] so is he seeking a new sleep medicine doctor for help with titration.  He currently reports intermittent chest discomfort described as left-sided, squeezing, lasting a few minutes, nonradiating, not associated with significant exertion.  He gets this either while at rest or while walking.  He reports that his mother died of a heart attack at the age of 45. He denies tobacco use, drinks alcohol occasionally, denies illicit drug use.  He reports that he walks every day with his small dog 30 minutes at a time without any chest pain or shortness of breath.  He otherwise denies significant palpitations, orthopnea, PND, lower extremity edema, syncope.  He has history of strokes in the past.      Problem List  There are no problems to display for this patient.       PMH  Past Medical History:   Diagnosis Date   . History of CVA (cerebrovascular accident)    . Hyperlipidemia    . Hypertension    . Type 2 diabetes mellitus (CMS-HCC)         Meds  Current Outpatient Medications   Medication Sig   . amLODIPINE (NORVASC) 10 MG tablet Take 1 tablet (10 mg) by mouth daily.   Marland Kitchen aspirin 81 MG EC tablet Take 81 mg by mouth daily.   Marland Kitchen atorvastatin (LIPITOR) 40 MG tablet Take 40 mg by mouth daily.   . colchicine 0.6 MG tablet Take 0.6 mg by mouth 2 times daily.   . hydrochlorothiazide (HYDRODIURIL) 25 MG tablet Take 1 tablet (25 mg) by mouth daily.   . isosorbide mononitrate (IMDUR) 30 MG Controlled-Release tablet Take 30 mg by mouth every morning.   Marland Kitchen losartan (COZAAR) 100 MG tablet Take 100 mg by mouth daily.   . metoprolol succinate (TOPROL XL) 50 MG XL tablet Take 1 tablet (50 mg) by mouth daily.   . pioglitazone (ACTOS) 30 mg tablet Take 30 mg by mouth daily.   . potassium chloride (K-DUR) 10 MEQ Sustained-Release tablet Take 20 mEq by mouth daily.     No current facility-administered medications for this visit.       All  No Known Allergies    ROS  A comprehensive review of 12 organ systems was performed and reviewed with the patient. Review of systems with the patient was negative except as  indicated in the HPI above.    SH  Social History     Socioeconomic History   . Marital status: Married     Spouse name: Not on file   . Number of children: Not on file   . Years of education: Not on file   . Highest education level: Not on file   Occupational History   . Not on file   Tobacco Use   . Smoking status: Never Smoker   . Smokeless tobacco: Never Used   Substance and Sexual Activity   . Alcohol use: Not on file   . Drug use: Not on file   . Sexual activity: Not on file   Other Topics Concern   . Not on file   Social History Narrative   . Not on file     Social Determinants of Health     Financial Resource Strain: Not on file   Food Insecurity: Not on file   Transportation Needs: Not on file   Physical Activity: Not on file   Stress: Not on file   Social Connections: Not on file   Intimate Partner Violence: Not on file   Housing Stability: Not on file      FH  No family history on file.    Past Surgical History:  No date: Left knee surgery     Physical Exam  BP (!) 148/98 (BP Location: Right arm, BP Patient Position: Sitting, BP cuff size: Regular)   Pulse 98   Resp 16   Wt 79.2 kg (174 lb 11.4 oz)   BMI 27.36 kg/m   Gen: pleasant, in NAD  Neuro: A&Ox3, no focal deficit  HEENT:  NC/AT. MMM  Neck: no bruit. JVD   CV: regular rate, normal rhythm, S1 S2   Resp: clear, no rales  Abd:  NABS soft ntnd  Ext: no c/c/e  Vasc: 2+ radial, DP pulses. Cap refill < 3 sec. Periphery warm    Labs  Lab Results   Component Value Date    RBC 5.63 (H) 12/25/2020    HGB 16.2 12/25/2020    HCT 48.4 12/25/2020    MCV 86.0 12/25/2020    MCHC 33.4 12/25/2020    RDW 14.3 12/25/2020    PLT 247 12/25/2020     Lab Results   Component Value Date    BUN 18 12/25/2020    CREAT 1.1 12/25/2020    CL 104 12/25/2020    K 4.1 12/25/2020    [place] 8.9 12/25/2020    GLU 146 (H) 12/25/2020     No results found for: INR, PTT    No results found for: BNP, BNPP  Lab Results   Component Value Date    A1C 7.4 (H) 01/24/2021     No results found for: CHOL, HDL, LDLCALC, TRIG, LDLDIRECT    Other Diagnostics      Assessment and plan:   Keith Cross is a 60 year old male    #Hypertension  -still above goal on home values  -Losartan 100 mg daily, Metoprolol 50 mg XL, amlodipine 5 mg daily, furosemide 40 mg daily  -Patient reports on imdur and hydralazine as well, but unknown doses.  -will switch furosemide to hydrochlorothiazide 25 mg daily.  Will increase amlodipine to 10 mg daily.  -Resistant hypertension  -Prior secondary hypertension work-up negative for hyperaldosteronism and pheochromocytoma.  Normal creatinine.  His left arm blood pressure was actually higher than his right arm blood pressure which does not suggest  coarctation of the aorta.  Nonetheless, I will order transthoracic echocardiogram as below.  -Bilateral renal artery stenosis unlikely with normal creatinine on ARB but will check renal  dopplers  -he has a history of sleep apnea and needs referral for sleep medicine doctor.  I will place this.    #Chest pain/routine health maintenance  -atypical chest pain but with multiple risk factors including advanced age, family history, hypertension, diabetes  -check fasting lipid panel for risk stratification  -check exercise stress echocardiogram for ischemic evaluation    #Valve issue?  -Unclear issue with the valve.  Order transthoracic echocardiogram for further evaluation.  He has TTE in our system from 2016 showing aortic sclerosis with no other signficant valve disease    Return to clinic after above studies completed          ICD-10-CM ICD-9-CM    1. Essential hypertension, malignant  I10 401.0 US Duplex Kidneys Arterial + Venous      ECG, In Clinic      Consult/Referral to Sleep Medicine      Basic Metabolic Panel, Blood Green Plasma Separator Tube   2. Routine health maintenance  Z00.00 V70.0 Lipid Panel Green Plasma Separator Tube      Lipid Panel Green Plasma Separator Tube   3. Chest pain, unspecified type  R07.9 786.50 Exercise ECHO w/US Enhancement Agent if needed      perflutren lipid microspheres (DEFINITY) injection 0.44 mg      sodium chloride 0.9 % flush 10 mL   4. Aortic valve disorder  I35.9 424.1 Complete 2D ECHO with 3D w [place] Image Enhancement Agent if Needed      perflutren lipid microspheres (DEFINITY) injection 0.44 mg      sodium chloride 0.9 % flush 10 mL        There are no Patient Instructions on file for this visit.    Paul Half, MD  Health Sciences Assistant Clinical Professor  Division of Cardiology  Department of [place]

## 2021-03-13 ENCOUNTER — Ambulatory Visit: Payer: BC Managed Care – PPO | Attending: Cardiovascular Disease | Admitting: Cardiovascular Disease

## 2021-03-13 ENCOUNTER — Encounter: Payer: Self-pay | Admitting: Cardiovascular Disease

## 2021-03-13 VITALS — BP 148/98 | HR 98 | Resp 16 | Wt 174.7 lb

## 2021-03-13 DIAGNOSIS — Z0000 Encounter for general adult medical examination without abnormal findings: Secondary | ICD-10-CM

## 2021-03-13 DIAGNOSIS — I10 Essential (primary) hypertension: Secondary | ICD-10-CM | POA: Insufficient documentation

## 2021-03-13 DIAGNOSIS — I359 Nonrheumatic aortic valve disorder, unspecified: Secondary | ICD-10-CM

## 2021-03-13 DIAGNOSIS — Z6827 Body mass index (BMI) 27.0-27.9, adult: Secondary | ICD-10-CM

## 2021-03-13 DIAGNOSIS — R079 Chest pain, unspecified: Secondary | ICD-10-CM | POA: Insufficient documentation

## 2021-03-13 LAB — ECG IN CLINIC
P AXIS: 65 Deg
PR INTERVAL: 141 ms
QRS INTERVAL/DURATION: 107 ms
QT: 343 ms
QTC INTERVAL: 394 ms
R AXIS: -41 Deg
R-R INTERVAL AVERAGE: 643 ms
T AXIS: 26 Deg
VENTRICULAR RATE: 93 {beats}/min

## 2021-03-13 MED ORDER — AMLODIPINE 10 MG OR TABS
10.0000 mg | ORAL_TABLET | Freq: Every day | ORAL | 3 refills | Status: DC
Start: 2021-03-13 — End: 2021-04-17

## 2021-03-13 MED ORDER — HYDROCHLOROTHIAZIDE 25 MG OR TABS
25.0000 mg | ORAL_TABLET | Freq: Every day | ORAL | 3 refills | Status: DC
Start: 2021-03-13 — End: 2021-04-17

## 2021-03-13 MED ORDER — METOPROLOL SUCCINATE 50 MG OR TB24
50.0000 mg | ORAL_TABLET | Freq: Every day | ORAL | 3 refills | Status: DC
Start: 2021-03-13 — End: 2021-04-17

## 2021-03-13 NOTE — Patient Instructions (Addendum)
Losartan 100 mg daily  Metoprolol 50 mg XL daily  Increase amlodipine to 10 mg daily  Stop furosemide 40 mg daily for now.  Start hydrochlorothiazide 25 mg daily  Please complete fasting labs in 1 week

## 2021-03-18 LAB — ECG IN CLINIC
QTC INTERVAL: 394 ms
VENTRICULAR RATE: 93 {beats}/min

## 2021-03-26 ENCOUNTER — Telehealth: Payer: Self-pay

## 2021-03-26 NOTE — Telephone Encounter (Signed)
Spoke with patient and confirmed cardiac stress test appointment. Test instructions provided: NPO for 2 hours prior to test, wear comfortable clothing and walking shoes, take regularly scheduled meds with sips of water (if taking beta blocker-hold 2 doses) and test location is in [facility] .  If patient feels ill, have any symptoms of Covid, or would like to reschedule, the patient may call [phone].  Patient verbalized understanding of stress test instructions.

## 2021-03-27 ENCOUNTER — Inpatient Hospital Stay: Admit: 2021-03-27 | Payer: Self-pay | Admitting: Cardiology

## 2021-03-27 ENCOUNTER — Ambulatory Visit (HOSPITAL_BASED_OUTPATIENT_CLINIC_OR_DEPARTMENT_OTHER)
Admit: 2021-03-27 | Discharge: 2021-03-27 | Disposition: A | Discharge status: Home Health | Payer: BLUE CROSS/BLUE SHIELD

## 2021-03-27 ENCOUNTER — Emergency Department: Payer: BLUE CROSS/BLUE SHIELD

## 2021-03-27 ENCOUNTER — Ambulatory Visit
Admission: RE | Admit: 2021-03-27 | Discharge: 2021-03-27 | Disposition: A | Discharge status: Home / Self Care | Payer: BLUE CROSS/BLUE SHIELD | Attending: Cardiovascular Disease | Admitting: Cardiovascular Disease

## 2021-03-27 ENCOUNTER — Inpatient Hospital Stay
Admission: AD | Admit: 2021-03-27 | Discharge: 2021-03-28 | DRG: 287 | Disposition: A | Discharge status: Home / Self Care | Payer: BLUE CROSS/BLUE SHIELD | Source: Ambulatory Visit | Attending: Internal Medicine | Admitting: Internal Medicine

## 2021-03-27 ENCOUNTER — Telehealth: Payer: Self-pay | Admitting: Obstetrics & Gynecology

## 2021-03-27 ENCOUNTER — Encounter
Admission: AD | Disposition: A | Discharge status: Home Health | Payer: Self-pay | Source: Ambulatory Visit | Attending: Internal Medicine

## 2021-03-27 DIAGNOSIS — I358 Other nonrheumatic aortic valve disorders: Secondary | ICD-10-CM

## 2021-03-27 DIAGNOSIS — Z8673 Personal history of transient ischemic attack (TIA), and cerebral infarction without residual deficits: Secondary | ICD-10-CM

## 2021-03-27 DIAGNOSIS — R079 Chest pain, unspecified: Secondary | ICD-10-CM

## 2021-03-27 DIAGNOSIS — R9439 Abnormal result of other cardiovascular function study: Secondary | ICD-10-CM

## 2021-03-27 DIAGNOSIS — I10 Essential (primary) hypertension: Secondary | ICD-10-CM | POA: Diagnosis present

## 2021-03-27 DIAGNOSIS — E119 Type 2 diabetes mellitus without complications: Secondary | ICD-10-CM | POA: Diagnosis present

## 2021-03-27 DIAGNOSIS — I359 Nonrheumatic aortic valve disorder, unspecified: Secondary | ICD-10-CM

## 2021-03-27 DIAGNOSIS — Z7409 Other reduced mobility: Secondary | ICD-10-CM

## 2021-03-27 DIAGNOSIS — Z20822 Contact with and (suspected) exposure to covid-19: Secondary | ICD-10-CM | POA: Diagnosis present

## 2021-03-27 DIAGNOSIS — Z8249 Family history of ischemic heart disease and other diseases of the circulatory system: Secondary | ICD-10-CM

## 2021-03-27 DIAGNOSIS — R0602 Shortness of breath: Secondary | ICD-10-CM | POA: Diagnosis present

## 2021-03-27 DIAGNOSIS — R0789 Other chest pain: Secondary | ICD-10-CM

## 2021-03-27 DIAGNOSIS — Z7984 Long term (current) use of oral hypoglycemic drugs: Secondary | ICD-10-CM

## 2021-03-27 DIAGNOSIS — I2510 Atherosclerotic heart disease of native coronary artery without angina pectoris: Secondary | ICD-10-CM

## 2021-03-27 DIAGNOSIS — Z79899 Other long term (current) drug therapy: Secondary | ICD-10-CM

## 2021-03-27 DIAGNOSIS — I200 Unstable angina: Secondary | ICD-10-CM | POA: Insufficient documentation

## 2021-03-27 DIAGNOSIS — R000 Tachycardia, unspecified: Secondary | ICD-10-CM

## 2021-03-27 DIAGNOSIS — R9431 Abnormal electrocardiogram [ECG] [EKG]: Secondary | ICD-10-CM

## 2021-03-27 DIAGNOSIS — G4733 Obstructive sleep apnea (adult) (pediatric): Secondary | ICD-10-CM | POA: Diagnosis present

## 2021-03-27 DIAGNOSIS — I700 Atherosclerosis of aorta: Secondary | ICD-10-CM

## 2021-03-27 DIAGNOSIS — E785 Hyperlipidemia, unspecified: Secondary | ICD-10-CM | POA: Diagnosis present

## 2021-03-27 LAB — LIPOPROTEIN(A), BLOOD: Lipoprotein (A): <10 nmol/L (ref <75)

## 2021-03-27 LAB — COMPREHENSIVE METABOLIC PANEL, BLOOD
ALT: 33 U/L (ref 7–52)
AST: 21 U/L (ref 13–39)
Albumin: 4.3 G/DL (ref 4.2–5.5)
Alk Phos: 47 U/L (ref 34–104)
BUN: 16 mg/dL (ref 7–25)
Bilirubin, Total: 0.9 mg/dL (ref 0.0–1.4)
CO2: 23 mmol/L (ref 21–31)
Calcium: 9 mg/dL (ref 8.6–10.3)
Chloride: 104 mmol/L (ref 98–107)
Creat: 1.1 mg/dL (ref 0.7–1.3)
Electrolyte Balance: 11 mmol/L (ref 2–12)
Glucose: 139 mg/dL — ABNORMAL HIGH (ref 85–125)
Potassium: 3.7 mmol/L (ref 3.5–5.1)
Protein, Total: 7.2 G/DL (ref 6.0–8.3)
Sodium: 138 mmol/L (ref 136–145)
eGFR - high estimate: >60 (ref >59)
eGFR - low estimate: >60 (ref >59)

## 2021-03-27 LAB — CBC WITH DIFF, BLOOD
ANC automated: 4.4 10*3/uL (ref 2.0–8.1)
Basophils %: 0.7 %
Basophils Absolute: 0.1 10*3/uL (ref 0.0–0.2)
Eosinophils %: 4.1 %
Eosinophils Absolute: 0.3 10*3/uL (ref 0.0–0.5)
Hematocrit: 47.7 % (ref 39.5–50.0)
Hgb: 15.8 G/DL (ref 13.5–16.9)
Lymphocytes %: 22.4 %
Lymphocytes Absolute: 1.6 10*3/uL (ref 0.9–3.3)
MCH: 28.4 PG (ref 27.0–33.5)
MCHC: 33.2 G/DL (ref 32.0–35.5)
MCV: 85.7 FL (ref 81.5–97.0)
MPV: 8.8 FL (ref 7.2–11.7)
Monocytes %: 11.1 %
Monocytes Absolute: 0.8 10*3/uL (ref 0.0–0.8)
Neutrophils % (A): 61.7 %
PLT Count: 213 10*3/uL (ref 150–400)
RBC: 5.57 10*6/uL (ref 4.38–5.62)
RDW-CV: 13.9 % (ref 11.6–14.4)
White Bld Cell Count: 7.1 10*3/uL (ref 4.0–10.5)

## 2021-03-27 LAB — COVID-19, FLU A/B PANEL (POC)
COVID-19 Result: NOT DETECTED
Influenza A, PCR: NOT DETECTED
Influenza B, PCR: NOT DETECTED
Respiratory Virus Comment: NOT DETECTED

## 2021-03-27 LAB — COMPLETE 2D ECHO WITH 3D
AO Mean Gradient: 2.8 mmHg
AO Peak Velocity: 1.04 m/s
Aortic Valve Area: 2.79 cm2
LA Volume Index: 22.2 ml/m2
LV Diastolic Diameter: 4.61 cm
LV Ejection Fraction: 51.2 %
LV Stroke Volume Index: 37.6 ml/m2
Mitral Valve Area: 5.14 cm2

## 2021-03-27 LAB — THYROID FUNCTION PANEL (ULTRASENSITIVE TSH + FREE T4)
Free T4: 0.92 ng/dL (ref 0.60–1.12)
TSH, Ultrasensitive: 1.303 u[IU]/mL (ref 0.450–4.120)

## 2021-03-27 LAB — BNP, BLOOD: BNP: 21 pg/mL (ref 0–100)

## 2021-03-27 LAB — LIPID(CHOL FRACT) PANEL, BLOOD
Cholesterol: 164 MG/DL (ref <200)
HDL Cholesterol: 47 MG/DL (ref >40)
LDL Cholesterol (calc): 85 MG/DL (ref <160)
Non HDL Cholesterol (calculated): 117 MG/DL (ref <130)
Triglycerides: 158 MG/DL — ABNORMAL HIGH (ref <150)
VLDL Cholesterol (calculated): 32 MG/DL

## 2021-03-27 LAB — GLYCOSYLATED HGB(A1C), BLOOD: Glycated Hgb, A1C: 7.2 % — ABNORMAL HIGH (ref 4.6–5.6)

## 2021-03-27 LAB — PT/INR/PTT
INR: 0.95 (ref 0.90–1.10)
PTT: 28.5 s (ref 24.3–34.9)
Prothrombin Time: 12.3 s (ref 11.8–13.8)

## 2021-03-27 LAB — GLUCOSE, POINT OF CARE
Glucose, Point of Care: 121 MG/DL (ref 70–125)
Glucose, Point of Care: 104 MG/DL (ref 70–125)

## 2021-03-27 LAB — TROPONIN I, HIGH SENSITIVITY: Troponin I, High Sensitivity: 11 ng/L (ref 0–20)

## 2021-03-27 SURGERY — CATHETERIZATION, HEART, LEFT, WITH INTERVENTION IF INDICATED
Anesthesia: Moderate Sedation - by non-anesthesia staff only

## 2021-03-27 SURGERY — CATHETERIZATION, HEART, LEFT
Anesthesia: Moderate Sedation - by non-anesthesia staff only

## 2021-03-27 MED ORDER — LIDOCAINE HCL 1 % IJ SOLN
INTRAMUSCULAR | Status: AC
Start: 2021-03-27 — End: 2021-03-27
  Filled 2021-03-27: qty 50

## 2021-03-27 MED ORDER — EPINEPHRINE 1 MG/10ML IJ SOSY
PREFILLED_SYRINGE | INTRAMUSCULAR | Status: AC
Start: 2021-03-27 — End: 2021-03-27
  Filled 2021-03-27: qty 10

## 2021-03-27 MED ORDER — PERFLUTREN LIPID MICROSPHERE 1.1 MG/ML IV SUSP
0.4000 mL | Freq: Once | INTRAVENOUS | Status: DC
Start: 2021-03-27 — End: 2021-03-28

## 2021-03-27 MED ORDER — LOSARTAN POTASSIUM 50 MG OR TABS
100.0000 mg | ORAL_TABLET | Freq: Every evening | ORAL | Status: DC
Start: 2021-03-27 — End: 2021-03-28
  Administered 2021-03-27 (×2): 100 mg via ORAL
  Filled 2021-03-27: qty 2

## 2021-03-27 MED ORDER — FENTANYL CITRATE (PF) 100 MCG/2ML IJ SOLN
INTRAMUSCULAR | Status: AC
Start: 2021-03-27 — End: 2021-03-27
  Filled 2021-03-27: qty 2

## 2021-03-27 MED ORDER — METOPROLOL SUCCINATE 25 MG OR TB24
50.0000 mg | ORAL_TABLET | Freq: Every day | ORAL | Status: DC
Start: 2021-03-27 — End: 2021-03-28
  Administered 2021-03-27 (×2): 50 mg via ORAL
  Filled 2021-03-27: qty 2

## 2021-03-27 MED ORDER — DIPHENHYDRAMINE HCL 50 MG/ML IJ SOLN
INTRAMUSCULAR | Status: AC
Start: 2021-03-27 — End: 2021-03-27
  Filled 2021-03-27: qty 1

## 2021-03-27 MED ORDER — MIDAZOLAM HCL 2 MG/2ML IJ SOLN
INTRAMUSCULAR | Status: AC
Start: 2021-03-27 — End: 2021-03-27
  Filled 2021-03-27: qty 2

## 2021-03-27 MED ORDER — NITROGLYCERIN 0.4 MG/SPRAY TL SOLN
Status: AC
Start: 2021-03-27 — End: 2021-03-27
  Filled 2021-03-27: qty 4.9

## 2021-03-27 MED ORDER — PERFLUTREN LIPID MICROSPHERE 1.1 MG/ML IV SUSP
0.4000 mL | Freq: Once | INTRAVENOUS | Status: DC
Start: 2021-03-27 — End: 2021-03-27
  Administered 2021-03-27: 0.4 mL via INTRAVENOUS

## 2021-03-27 MED ORDER — ATORVASTATIN CALCIUM 40 MG OR TABS
80.0000 mg | ORAL_TABLET | Freq: Every day | ORAL | Status: DC
Start: 2021-03-27 — End: 2021-03-28
  Administered 2021-03-27 – 2021-03-28 (×3): 80 mg via ORAL
  Filled 2021-03-27 (×2): qty 2

## 2021-03-27 MED ORDER — POLYETHYLENE GLYCOL 3350 OR PACK
17.0000 g | PACK | Freq: Every day | ORAL | Status: DC
Start: 2021-03-27 — End: 2021-03-28
  Filled 2021-03-27: qty 1

## 2021-03-27 MED ORDER — GLUCAGON HCL (DIAGNOSTIC) 1 MG IJ SOLR
1.0000 mg | INTRAMUSCULAR | Status: DC | PRN
Start: 2021-03-27 — End: 2021-03-28
  Filled 2021-03-27 (×2): qty 1

## 2021-03-27 MED ORDER — ATORVASTATIN CALCIUM 40 MG OR TABS
40.0000 mg | ORAL_TABLET | Freq: Every day | ORAL | Status: DC
Start: 2021-03-27 — End: 2021-03-27

## 2021-03-27 MED ORDER — ATROPINE SULFATE 1 MG/10ML IJ SOSY
PREFILLED_SYRINGE | INTRAMUSCULAR | Status: AC
Start: 2021-03-27 — End: 2021-03-27
  Filled 2021-03-27: qty 10

## 2021-03-27 MED ORDER — HEPARIN (PORCINE) IN NACL 1000-0.9 UT/500ML-% IV SOLN
INTRAVENOUS | Status: AC
Start: 2021-03-27 — End: 2021-03-27
  Filled 2021-03-27: qty 1000

## 2021-03-27 MED ORDER — HYDROCHLOROTHIAZIDE 12.5 MG OR TABS
25.0000 mg | ORAL_TABLET | Freq: Every day | ORAL | Status: DC
Start: 2021-03-27 — End: 2021-03-28
  Administered 2021-03-27 – 2021-03-28 (×3): 25 mg via ORAL
  Filled 2021-03-27 (×2): qty 2

## 2021-03-27 MED ORDER — DEXTROSE 10 % IV SOLN
50.0000 mL/h | INTRAVENOUS | Status: DC | PRN
Start: 2021-03-27 — End: 2021-03-28

## 2021-03-27 MED ORDER — LOSARTAN POTASSIUM 50 MG OR TABS
100.0000 mg | ORAL_TABLET | Freq: Every day | ORAL | Status: DC
Start: 2021-03-28 — End: 2021-03-27

## 2021-03-27 MED ORDER — INSULIN LISPRO (HUMAN) 100 UNIT/ML SC SOLN (~~LOC~~)
2.0000 [IU] | Freq: Three times a day (TID) | SUBCUTANEOUS | Status: DC
Start: 2021-03-27 — End: 2021-03-28

## 2021-03-27 MED ORDER — SODIUM CHLORIDE FLUSH 0.9 % IV SOLN
10.0000 mL | Freq: Once | INTRAVENOUS | Status: DC
Start: 2021-03-27 — End: 2021-03-27
  Administered 2021-03-27: 10 mL via INTRAVENOUS

## 2021-03-27 MED ORDER — INSULIN LISPRO (HUMAN) 100 UNIT/ML SC SOLN (~~LOC~~)
1.0000 [IU] | Freq: Every evening | SUBCUTANEOUS | Status: DC
Start: 2021-03-27 — End: 2021-03-28

## 2021-03-27 MED ORDER — ENOXAPARIN SODIUM 40 MG/0.4ML IJ SOSY
40.0000 mg | PREFILLED_SYRINGE | Freq: Every day | INTRAMUSCULAR | Status: DC
Start: 2021-03-28 — End: 2021-03-28
  Administered 2021-03-28: 40 mg via SUBCUTANEOUS
  Filled 2021-03-27 (×2): qty 1

## 2021-03-27 MED ORDER — ACETAMINOPHEN 325 MG PO TABS
650.0000 mg | ORAL_TABLET | ORAL | Status: DC | PRN
Start: 2021-03-27 — End: 2021-03-28
  Administered 2021-03-27 – 2021-03-28 (×2): 650 mg via ORAL
  Filled 2021-03-27 (×2): qty 2

## 2021-03-27 MED ORDER — ASPIRIN EC 81 MG OR TBEC (CUSTOM)
81.0000 mg | DELAYED_RELEASE_TABLET | Freq: Every day | ORAL | Status: DC
Start: 2021-03-28 — End: 2021-03-28
  Administered 2021-03-28: 81 mg via ORAL
  Filled 2021-03-27: qty 1

## 2021-03-27 MED ORDER — DEXTROSE 50 % IV SOLN
6.2500 g | INTRAVENOUS | Status: DC | PRN
Start: 2021-03-27 — End: 2021-03-28

## 2021-03-27 MED ORDER — NITROGLYCERIN 0.4 MG/SPRAY TL SOLN
1.0000 | Status: AC | PRN
Start: 2021-03-27 — End: ?
  Administered 2021-03-27 (×2): 3 via SUBLINGUAL

## 2021-03-27 MED ORDER — HEPARIN SODIUM (PORCINE) 1000 UNIT/ML IJ SOLN (CUSTOM)
INTRAMUSCULAR | Status: AC
Start: 2021-03-27 — End: 2021-03-27
  Filled 2021-03-27: qty 30

## 2021-03-27 MED ORDER — HYDROCODONE-ACETAMINOPHEN 5-325 MG OR TABS
1.0000 | ORAL_TABLET | ORAL | Status: DC | PRN
Start: 2021-03-27 — End: 2021-03-28
  Administered 2021-03-27 (×2): 1 via ORAL
  Filled 2021-03-27: qty 1

## 2021-03-27 MED ORDER — SODIUM CHLORIDE FLUSH 0.9 % IV SOLN
10.0000 mL | Freq: Once | INTRAVENOUS | Status: DC
Start: 2021-03-27 — End: 2021-03-28

## 2021-03-27 MED ORDER — ACETAMINOPHEN 650 MG RE SUPP
650.0000 mg | RECTAL | Status: DC | PRN
Start: 2021-03-27 — End: 2021-03-28

## 2021-03-27 MED ORDER — SENNA 8.6 MG OR TABS
8.6000 mg | ORAL_TABLET | Freq: Every evening | ORAL | Status: DC
Start: 2021-03-27 — End: 2021-03-28
  Administered 2021-03-27 (×2): 8.6 mg via ORAL
  Filled 2021-03-27: qty 1

## 2021-03-27 MED ORDER — IOHEXOL 350 MG/ML IV SOLN
INTRAVENOUS | Status: AC
Start: 2021-03-27 — End: 2021-03-27
  Filled 2021-03-27: qty 150

## 2021-03-27 MED ORDER — ASPIRIN 325 MG OR TABS
325.0000 mg | ORAL_TABLET | Freq: Once | ORAL | Status: AC
Start: 2021-03-27 — End: 2021-03-27
  Administered 2021-03-27: 325 mg via ORAL
  Filled 2021-03-27: qty 1

## 2021-03-27 MED ORDER — ASPIRIN EC 81 MG OR TBEC (CUSTOM)
81.0000 mg | DELAYED_RELEASE_TABLET | Freq: Every day | ORAL | Status: DC
Start: 2021-03-27 — End: 2021-03-27

## 2021-03-27 SURGICAL SUPPLY — 8 items
CATHETER ANGIO .038IN 21ML/SEC RADL TIGER CRV STRG 5FR 4MM 1.2MM 100CM 2 BRD SH LG LUM GW SS NYL PU (Procedural wires/sheaths/catheters/balloons/dilators) ×2 IMPLANT
CATHETER GD BLU YLW 100CM 6FR .072IN ADROIT TRUELUMEN XB3.5 CRV SS PTFE HBRD BRD KINK RST (Procedural wires/sheaths/catheters/balloons/dilators) ×2 IMPLANT
CATHETER GD BLU YLW 110CM 5FR .056IN VSBR TRUELUMEN LNG JR 4 CRV PERI SS NYL PTFE HBRD BRD RADOPQ (Procedural wires/sheaths/catheters/balloons/dilators) ×2 IMPLANT
DEVICE CMPR 24CM TR BAND REG RADL ART VLCR 2 BLN STRAP TRNS (Disp Instruments) ×2 IMPLANT
GLIDESHEATH 6FR RADIAL (Procedural wires/sheaths/catheters/balloons/dilators) ×2 IMPLANT
GUIDEWIRE VASC 175CM .014IN RADOPQ SNSR ELMT FLXB LGTH CNCT 3CM PRSWR PTFE HDRPH FFR MSR SYS (Procedural wires/sheaths/catheters/balloons/dilators) ×2 IMPLANT
VALVE HMSTS .096IN COPLT 1 UN BLDBCK CNTRL STRL DISP (Misc Surgical Supply) ×2 IMPLANT
WIRE - 035 X 260CM WORKHORSE VERSACORE MODIFIED J (Procedural wires/sheaths/catheters/balloons/dilators) ×2 IMPLANT

## 2021-03-27 NOTE — ED Notes (Signed)
Report given to RN [facility] Cath Lab

## 2021-03-27 NOTE — Telephone Encounter (Signed)
Patients daughter is calling in to find out who and why was her father sent to ER. She said who did this decision come from. Called clinic spoke with jesus. Transferred pt's daughter. Thank you.

## 2021-03-27 NOTE — Interdisciplinary (Unsigned)
Patient complained of CP 10/10 at the peak of treadmill stress test. BP 195/111 HR 150 bpm. Stress test stopped and Nitro spray x 3 given sublingual. Chest pain improved to 2/10. Cardiology Fellow Dr. Cherlyn Cushing and Cardiology attending Dr. Lonzo Cloud made aware and recommended patient transfer to ED and for possible Angiogram today.

## 2021-03-27 NOTE — Interdisciplinary (Unsigned)
DATE OF SERVICE: 03/27/2021    CARDIAC STRESS TEST NURSING ASSESSMENT    PATIENT NAME:  Keith Cross    REFERRING PHYSICIAN:  Dr. Paul Half    The patient is here today for a cardiac stress test.     Indication(s) for Procedure:   Ischemia or arrhythmias   Functional capacity     S-Subjective:       The patient currently denies chest pain, shortness of breath, dizziness, and palpitations. The patient verbalizes understanding of cardiac stress test instructions.    O-Objective  The patient has been pre-evaluated and is appropriate for cardiac stress test today. Previous medical history and surgical history; prior cardiac stress test, echocardiogram, ECG, invasive cardiac diagnostic test, radiology test, and labs per protocol are reviewed.     Past Medical History:  Past Medical History:   Diagnosis Date   . History of CVA (cerebrovascular accident)    . Hyperlipidemia    . Hypertension    . Type 2 diabetes mellitus (CMS-HCC)        Social History:  Social History     Socioeconomic History   . Marital status: Married   Tobacco Use   . Smoking status: Never Smoker   . Smokeless tobacco: Never Used                  Family History:              No family history on file.                          Allergies:               Patient has no known allergies.               Medications:                 Current Outpatient Medications:   .  amLODIPINE (NORVASC) 10 MG tablet, Take 1 tablet (10 mg) by mouth daily., Disp: 90 tablet, Rfl: 3  .  aspirin 81 MG EC tablet, Take 81 mg by mouth daily., Disp: , Rfl:   .  atorvastatin (LIPITOR) 40 MG tablet, Take 40 mg by mouth daily., Disp: , Rfl:   .  colchicine 0.6 MG tablet, Take 0.6 mg by mouth 2 times daily., Disp: , Rfl:   .  hydrochlorothiazide (HYDRODIURIL) 25 MG tablet, Take 1 tablet (25 mg) by mouth daily., Disp: 90 tablet, Rfl: 3  .  isosorbide mononitrate (IMDUR) 30 MG Controlled-Release tablet, Take 30 mg by mouth every morning., Disp: , Rfl:   .  losartan (COZAAR) 100 MG tablet,  Take 100 mg by mouth daily., Disp: , Rfl:   .  metoprolol succinate (TOPROL XL) 50 MG XL tablet, Take 1 tablet (50 mg) by mouth daily., Disp: 90 tablet, Rfl: 3  .  pioglitazone (ACTOS) 30 mg tablet, Take 30 mg by mouth daily., Disp: , Rfl:   .  potassium chloride (K-DUR) 10 MEQ Sustained-Release tablet, Take 20 mEq by mouth daily., Disp: , Rfl:              See Media tab for medication list.     A-Assessment:        See report for all vital signs pre, during and post stress test.         GEN: No apparent distress       LUNGS: Clear to auscultation bilaterally  CVS: *** Regular rhythm and rate, normal S1S2, no murmur       EXT: No pitting edema. No cyanosis. Warm to touch       NEURO: Alert and oriented x 4       PSYCH: Appropriate affect and interaction    P-Plan:      Proceed with stress testing per Standardized Nursing Policy for Non-Invasive Cardiac Stress Test.    The stress test was completed. The patient tolerated the test well, vital signs stable, asymptomatic, and discharged home per protocol. See Cardiology or Imaging report for final results of the stress test.       CONSENT OBTAINED/SIGNED:  YES  COVID-19 HEALTH SCREENING COMPLETED

## 2021-03-27 NOTE — Procedures (Incomplete)
Keith Cross is a 60 year old male patient.    ICD-10-CM ICD-9-CM   1. Chest pain, unspecified type  R07.9 786.50   2. Abnormal stress echo  R94.39 794.39     Past Medical History:   Diagnosis Date   . History of CVA (cerebrovascular accident)    . Hyperlipidemia    . Hypertension    . Type 2 diabetes mellitus (CMS-HCC)      Blood pressure (!) 141/94, pulse 102, temperature 98 F (36.7 C), resp. rate 18, height $RemoveBe'5\' 7"'RTdSuFRjm$  (1.702 m), weight 79 kg (174 lb 2.6 oz), SpO2 95 %.    Sedation    Date/Time: 03/27/2021 4:35 PM  Performed by: Caryl Asp, RN  Authorized by: Lavina Hamman, MD     General Sedation Information:     Sedation Supervised by:  Lavina Hamman, MD    Sedation Administered by (RN):  Caryl Asp, RN    Procedure Performed by:  Lavina Hamman, MD  Consent:     Consent obtained:  Verbal    Consent given by:  Patient  Indications:     Sedation purpose:  Cardiac procedure  Pre-sedation assessment:     NPO status:  8 hours or more    Pre-Sedation Pain Score:  0    Pain scale:  Numeric 0-10    Sedation Level Intended:  Moderate  Immediate pre-procedure details:     Reviewed: vital signs, relevant labs/tests and NPO status      Verified: bag valve mask available, crash cart available, intubation equipment available, IV patency confirmed, oxygen available, reversal medications available, suction available and select all    Post-procedure details:     Sedation Level Achieved:  Moderate    Post-Sedation Pain Score:  0    Intraoperative Events:  Please see Cardiology fellow's Pre-procedure Record for Sedation in Notes.    Please see full Cath Lab XPER report in the Epic chart for a complete record of the patient's vital signs, sedation and pain scores, drug administration times and other sedation details.    Total sedation given:    Fentanyl *** mcg IVP  Midazolam *** mg IVP    Patient tolerance:  Tolerated well, no immediate complications    Discontinue sedation monitoring or discharge if criteria met:  Yes     Attending Physician:  Was present for critical portions of the sedation procedure        Caryl Asp, RN  03/27/2021

## 2021-03-27 NOTE — ED Notes (Signed)
Bed: 13 CD  Expected date:   Expected time:   Means of arrival:   Comments:  Triage when ready

## 2021-03-27 NOTE — H&P (Signed)
CCU ADMISSION HISTORY & PHYSICAL NOTE - [facility]     Date of Evaluation: 03/27/2021   Attending Physician: Gordy Levan, MD  Service: Medicine Cardiology     History of Presenting Illness:     Keith Cross is a 60 year old male with PMH significant for hypertension, hyperlipidemia, NIDDM, prior CVA, OSA presenting to ED with chest pain after getting a outpatient stress test today. Patient is endorsing chest pain that started today at 2pm, substernal, 7/10 in severity, radiates to the left shoulder. Associated with shortness of breath. Nitroglycerin spray helped improve the pain. He is not on any blood thinners. He did not take his beta blocker or other hypertensive medications this morning. Denies any fever, chills, lightheadedness, palpitations, n/v/d/c, abdominal pain, dysuria or leg swelling. Walks every day with his small dog for 30 minutes at a time without chest pain or SOB.    ED Course: BP 148/101, HR 114, RR 20. Received aspirin $RemoveBeforeDE'325mg'xVKhEELLKNkrWCQ$  x 1.       Past History:      Past Medical/Surgical History:  Past Medical History:   Diagnosis Date   . History of CVA (cerebrovascular accident)    . Hyperlipidemia    . Hypertension    . Type 2 diabetes mellitus (CMS-HCC)        Home Medications:  Current Facility-Administered Medications on File Prior to Encounter   Medication Dose Route Frequency Provider Last Rate Last Admin   . nitroGLYcerin (NITROLINGUAL) spray 1 spray  1 spray Sublingual Q5 Min PRN Paul Half, MD   3 spray at 03/27/21 1439   . [COMPLETED] perflutren lipid microspheres (DEFINITY) injection 0.44 mg  0.4 mL IntraVENOUS Once Paul Half, MD   0.4 mL at 03/27/21 1424   . perflutren lipid microspheres (DEFINITY) injection 0.44 mg  0.4 mL IntraVENOUS Once Paul Half, MD       . Margrett Rud sodium chloride 0.9 % flush 10 mL  10 mL IntraVENOUS Once Paul Half, MD   10 mL at 03/27/21 1425   . sodium chloride 0.9 % flush 10 mL  10 mL IntraVENOUS Once Paul Half, MD         Current Outpatient  Medications on File Prior to Encounter   Medication Sig Dispense Refill   . amLODIPINE (NORVASC) 10 MG tablet Take 1 tablet (10 mg) by mouth daily. 90 tablet 3   . aspirin 81 MG EC tablet Take 81 mg by mouth daily.     Marland Kitchen atorvastatin (LIPITOR) 40 MG tablet Take 40 mg by mouth daily.     . colchicine 0.6 MG tablet Take 0.6 mg by mouth 2 times daily.     . hydrochlorothiazide (HYDRODIURIL) 25 MG tablet Take 1 tablet (25 mg) by mouth daily. 90 tablet 3   . isosorbide mononitrate (IMDUR) 30 MG Controlled-Release tablet Take 30 mg by mouth every morning.     Marland Kitchen losartan (COZAAR) 100 MG tablet Take 100 mg by mouth daily.     . metoprolol succinate (TOPROL XL) 50 MG XL tablet Take 1 tablet (50 mg) by mouth daily. 90 tablet 3   . pioglitazone (ACTOS) 30 mg tablet Take 30 mg by mouth daily.     . potassium chloride (K-DUR) 10 MEQ Sustained-Release tablet Take 20 mEq by mouth daily.         Family History:  He reports that his mother died of a heart attack at the age of 35.     Social History:  Social  History     Socioeconomic History   . Marital status: Married   Tobacco Use   . Smoking status: Never Smoker   . Smokeless tobacco: Never Used       Allergies:  No Known Allergies     Objective:     Review of Systems:   A 12 point review of systems was performed and the pertinent positives and negatives were mentioned in the HPI, all other review of systems were negative.   Nutrition:  No diet orders on file  Gastrointestinal:  negative  Pain:  Pain Score: 7    More than 2 other Review of Systems is negative.    Selected Medications:  SCHEDULED MEDS:  . aspirin  325 mg Once   . perflutren lipid microspheres  0.4 mL Once   . sodium chloride  10 mL Once       PRN MEDS:  . nitroGLYcerin  1 spray Q5 Min PRN       Physical Exam:  Vital Signs:  Ht.: Height: $RemoveBefo'5\' 7"'VAcyIDWkyzT$  (170.2 cm),     BMI: Body mass index is 27.28 kg/m.  Temperature:  [98 F (36.7 C)] 98 F (36.7 C) (05/18 1539)  Blood pressure (BP): (148)/(101) 148/101 (05/18  1539)  Heart Rate:  [114] 114 (05/18 1539)  Respirations:  [20] 20 (05/18 1539)  Pain Score: 7 (05/18 1539)  O2 Device: None (Room air) (05/18 1539)  SpO2:  [96 %] 96 % (05/18 1539)    Nursing Notes Reviewed:   No intake or output data in the 24 hours ending 03/27/21 1554     Weights (last 14 days)     Date/Time Weight Weight Source Percentage Weight Change (%) Who    03/27/21 1539 79 kg (174 lb 2.6 oz) -- 0 % [facility]                 Gen: NAD. A&Ox4.  HEENT: NC/AT, MMM  Neck: Neck is supple without carotid bruits, no JVD  Lungs:  CTA bilaterally, unlabored breathing on RA   Heart:  tachycardic, S1 S2, no murmurs  Abdomen:  soft, non-tender, normo-active bowel sounds  Extremities:  2+ pulses, no edema    Patient Lines/Drains/Airways Status     Active PICC Line / CVC Line / PIV Line / Drain / Airway / Intraosseous Line / Epidural Line / ART Line / Line Type / Wound / Pressure Ulcer Injury     Name Placement date Placement time Site Days    Peripheral IV - 24 G Left Hand 03/27/21  1407  Hand  less than 1                 Diagnostic Data:     Cardiac Enzymes:  No results for input(s): TROPI, TCPK, CPK, CKMB, CKIND, BNP, DIGOX in the last 72 hours.    BNP:  No results found for: BNP, BNPP    Electrolytes:  No results for input(s): SODIUM, NA, K, CL, CO2, BICARB, BUN, CREAT, GLU, [place], IONCA, MG, PHOS, TPROT, TP, ALB, ALK, TBILI, AST, ALT, LACT in the last 72 hours.    Coags:  No results for input(s): PROTIME, INR, PTT in the last 72 hours.    Thyroid Function:  TSH   FT4        A1c:   Lab Results   Component Value Date/Time    A1C 7.4 (H) 01/24/2021 12:30 PM       Lipid Panel:   No results found for:  CHOL, TRIG, HDLCH2, LDLCALC, VLDLC2, NHDL2    ASCVD Risk Score:  The ASCVD Risk score Mikey Bussing DC Jr., et al., 2013) failed to calculate for the following reasons:    Cannot find a previous HDL lab    Cannot find a previous total cholesterol lab    Imaging:  No results found.    Cardiac Studies:    EKG:   Sinus tachycardia, HR 113, new  T wave inversion on II, III, AvF    ECHOCARDIOGRAM:  Pending     STRESS TEST:  Done on 5/18, results not yet available     CARDIAC CATHETERIZATION:  Pending     Assessment & Plan:     Assessment:  Keith Cross is a 59 year old male with PMH significant for hypertension, hyperlipidemia, NIDDM, prior CVA, OSA presenting to ED with chest pain after getting a outpatient stress test today. Admitted for cardiac catheterization.     # Chest pain - substernal, radiating, relieved with nitroglycerin spray. EKG with new T wave inversion on lead II, III. AvF.   # Resistant Hypertension - on losartan 100mg , metoprolol XL 50mg , amlodipine 5mg  daily, and hydrochlorothiazide 25mg  daily   # Hyperlipidemia - on atorvastatin 40mg  daily at home   # NIDDM - Last A1c 7.4% on 01/24/2021. Previously on metformin but currently not on any diabetic meds   # OSA - CPAP machine from [place] seeking a new sleep medicine doctor   # Prior CVA - on atorvastatin, not on aspirin     Plan:  - Admit to CCU  - Patient consented for cardiac catheterization  - Patient loaded on aspirin 325mg    - Trend troponin  - Lipid panel, A1c, TSH, lipoprotein A   - F/u echocardiogram   - Increase Lipitor to 80mg  daily   - Start aspirin 81mg  daily   - Continue home hypertensive medications     Admission Checklist:  #Feeding: No diet orders on file  #DVT PPx:  Enoxaparin   #IVFs/Volume status: None / Euvolemic  #Antibiotic de-escalation:  n/a  #Bowel Regimen: Senna and Miralax  #Analgesia: .  aspirin, 325 mg, Oral  #GI Ulcer PPx: None Indicated  #Glycemic Control: Goal 140-180  #Therapy: PT/OT  #Sleep: None needed  #Daily Labs: CBC, CMP, Phos, Mag  #Disposition: Pending improvement in medical condition      Code Status: Orders Placed This Encounter      Full Code / Full resuscitative therapy / full diagnostic & therapeutic care      To be staffed with attending, Dr. Gordy Levan, MD, in the morning.    Genice Rouge, MD, PGY1  03/27/21 3:54 PM  Internal  Medicine/Cardiology Service

## 2021-03-27 NOTE — ED EKG Interpretation (Signed)
ED EKG Interpretation    EKG: Sinus Tachycardia with Normal Axis and no ST elevations or depressions.    Russella Dar, MD  03/27/21  7:47 PM

## 2021-03-27 NOTE — ED Provider Notes (Signed)
CHIEF COMPLAINT  Chest Pain - Adult (Sent from cardiology after having an abnormal stress test.  +CP)      HISTORY OF PRESENT ILLNESS:   Elster Corbello is a 60 year old male with past medical history of hypertension hyperlipidemia diabetes presenting with chest pain after getting history outpatient stress test today.  Also endorsing shortness of breath.  He denies being on any blood thinners.  He denies history of aortic aneurysm.  No syncopal episodes.  No fevers or cough.  No vomiting.  It cardiology sent patient to the ER for admission.    Location: chest pain  Radiation: none  Quality: none   Severity: moderate  Duration: an hour  Timing: constant  Context: none    REVIEW OF SYSTEMS:  Constitutional: no fever  Head: no headache  CV: + chest pain  Resp: + shortness of breath  GI: no vomiting  Back: no back pain  Skin: no rash  Neuro: no focal weakness    All other systems reviewed and negative except as noted above    PAST MEDICAL HISTORY:  Past Medical History:   Diagnosis Date    History of CVA (cerebrovascular accident)     Hyperlipidemia     Hypertension     Type 2 diabetes mellitus (CMS-HCC)         SURGICAL HISTORY:  Past Surgical History:   Procedure Laterality Date    Left knee surgery          ALLERGIES:  No Known Allergies      CURRENT MEDICATIONS:   Please see nursing notes    FAMILY HISTORY:  Reviewed and noncontributory      SOCIAL HISTORY:  no Tobacco  no Alcohol  no Drug use    VITAL SIGNS:  BP (!) 171/110 (BP Location: Left arm, BP Patient Position: Semi-Fowlers)   Pulse 84   Temp 97.7 F (36.5 C)   Resp 17   Ht 5\' 7"  (1.702 m)   Wt 79 kg (174 lb 2.6 oz)   SpO2 97%   BMI 27.28 kg/m     PHYSICAL EXAM:  General: Awake, alert, appears to be in no distress   Head: Normocephalic, atraumatic   Eyes: No scleral icterus, no conjunctival injection, extraocular motion intact   ENT: Normal appearing ears externally, normal appearing nose externally   Neck: Supple, full range of motion of neck    Respiratory: Normal effort, no audible stridor   Cardiovascular: Regular rate, no murmurs, well perfused   Abdomen: Soft, non-distended, non-tender.   Skin: No jaundice, no rash   Extremities: No upper extremity asymmetry, no lower extremity asymmetry   Neuro: Awake, alert, moving all extremities   Psych: Conversing appropriately, appropriate mood and affect, memory intact    MEDICAL DECISION MAKING:  Panfilo Ketchum is a 60 year old male who presents with chest pain. Differential diagnosis includes acs, chf, pneumonia, cxr, doubt aortic dissection. Will obtain cardiac workup and admit to cards.     Workup Summary         Value Comment By Time      Patient sent from cardiology for postivie stress test, they want him admitted for cath. Patient notes mild chest pressure, otherwise feels well  Russella Dar, MD 05/18 1535      Admitted: 60 year old male admitted to cardiology service for chest pain.-level of care: tele Wynell Balloon, MD 05/18 1538     ECG 12 Lead (Reviewed) Wynell Balloon, MD 05/18 1539  DIAGNOSIS:    ICD-10-CM ICD-9-CM   1. Chest pain, unspecified type  R07.9 786.50   2. Abnormal stress echo  R94.39 794.39               Wynell Balloon, MD  Resident  03/27/21 2020      ATTENDING ATTESTATION:  I evaluated the patient concurrently with the Resident/Fellow.  I discussed the case with the Resident/Fellow and agree with the findings and plan as documented by the Resident/Fellow.  Any additions or revisions are included in the record as necessary.    Russella Dar, MD       Russella Dar, MD  03/27/21 2112

## 2021-03-27 NOTE — Procedures (Signed)
Keith Cross is a 60 year old male patient.    ICD-10-CM ICD-9-CM   1. Chest pain, unspecified type  R07.9 786.50   2. Abnormal stress echo  R94.39 794.39     Past Medical History:   Diagnosis Date   . History of CVA (cerebrovascular accident)    . Hyperlipidemia    . Hypertension    . Type 2 diabetes mellitus (CMS-HCC)      Blood pressure (!) 148/101, pulse 114, temperature 98 F (36.7 C), resp. rate 20, height 5\' 7"  (1.702 m), weight 79 kg (174 lb 2.6 oz), SpO2 96 %.    Sedation    Date/Time: 03/27/2021 4:02 PM  Performed by: Molli Barrows, MD  Authorized by: Gordy Levan, MD     General Sedation Information:     Sedation Supervised by:  Dr. Posey Pronto    Sedation Start Time (First Medication Administered):  03/27/2021 4:03 PM  Consent:     Consent obtained:  Verbal and written    Consent given by:  Patient    Patient/Representative Counseled Regarding::  Risks, relative benefits, prolonged sedation, alternative anesthesia techniques, nausea/vomiting, intubation, all questions answered, patient / representative understands and accepts recommended plan, inadequate sedation, allergic reaction, medications, special techniques and select all  Indications:     Sedation purpose:  Cardiac procedure  Pre-sedation assessment:     NPO status:  Less than 8 hours    ASA classification: class 2 - patient with mild systemic disease      Pre-Sedation Pain Score:  1    Pain scale:  Numeric 0-10    Mouth opening:  3 cm or greater    Mallampati score:  I - soft palate, uvula, fauces, pillars visible    Dentition:  Normal    Jaw protrusion:  Normal    Thyromental distance:  6 cm    Neck anatomy:  Normal    C-Spine mobility:  Full    History of difficult intubation:  Unknown    Sedation Level Intended:  Moderate    Pre-sedation assessment completed:  03/27/2021 4:03 PM      Point of Care Frailty:  3 - Managing Well - Medical problems are well controlled, not regularly active      CHF:  1. History of CHF?  No CHF    Katalea Ucci Scherry Ran, MD  03/27/2021

## 2021-03-28 LAB — ECG 12-LEAD
P AXIS: 97 Deg
PR INTERVAL: 132 ms
QRS INTERVAL/DURATION: 97 ms
QT: 331 ms
QTc (Bazett): 398 ms
R AXIS: -7 Deg
R-R INTERVAL AVERAGE: 529 ms
T AXIS: -6 Deg
VENTRICULAR RATE: 113 {beats}/min

## 2021-03-28 LAB — MRSA CULTURE
Culture Result: NEGATIVE
Culture Result: NEGATIVE

## 2021-03-28 LAB — CBC WITH DIFF, BLOOD
ANC automated: 3.1 10*3/uL (ref 2.0–8.1)
Basophils %: 1 %
Basophils Absolute: 0.1 10*3/uL (ref 0.0–0.2)
Eosinophils %: 5 %
Eosinophils Absolute: 0.3 10*3/uL (ref 0.0–0.5)
Hematocrit: 46 % (ref 39.5–50.0)
Hgb: 15 G/DL (ref 13.5–16.9)
Lymphocytes %: 30.1 %
Lymphocytes Absolute: 1.7 10*3/uL (ref 0.9–3.3)
MCH: 28.2 PG (ref 27.0–33.5)
MCHC: 32.7 G/DL (ref 32.0–35.5)
MCV: 86.2 FL (ref 81.5–97.0)
MPV: 8.2 FL (ref 7.2–11.7)
Monocytes %: 8.9 %
Monocytes Absolute: 0.5 10*3/uL (ref 0.0–0.8)
Neutrophils % (A): 55 %
PLT Count: 195 10*3/uL (ref 150–400)
RBC: 5.34 10*6/uL (ref 4.38–5.62)
RDW-CV: 14.5 % — ABNORMAL HIGH (ref 11.6–14.4)
White Bld Cell Count: 5.6 10*3/uL (ref 4.0–10.5)

## 2021-03-28 LAB — COMPREHENSIVE METABOLIC PANEL, BLOOD
ALT: 39 U/L (ref 7–52)
AST: 23 U/L (ref 13–39)
Albumin: 3.9 G/DL — ABNORMAL LOW (ref 4.2–5.5)
Alk Phos: 46 U/L (ref 34–104)
BUN: 16 mg/dL (ref 7–25)
Bilirubin, Total: 0.9 mg/dL (ref 0.0–1.4)
CO2: 25 mmol/L (ref 21–31)
Calcium: 9 mg/dL (ref 8.6–10.3)
Chloride: 104 mmol/L (ref 98–107)
Creat: 1.1 mg/dL (ref 0.7–1.3)
Electrolyte Balance: 9 mmol/L (ref 2–12)
Glucose: 126 mg/dL — ABNORMAL HIGH (ref 85–125)
Potassium: 3.9 mmol/L (ref 3.5–5.1)
Protein, Total: 6.3 G/DL (ref 6.0–8.3)
Sodium: 138 mmol/L (ref 136–145)
eGFR - high estimate: >60 (ref >59)
eGFR - low estimate: >60 (ref >59)

## 2021-03-28 LAB — MAGNESIUM, BLOOD: Magnesium: 2 mg/dL (ref 1.9–2.7)

## 2021-03-28 LAB — GLUCOSE, POINT OF CARE
Glucose, Point of Care: 138 MG/DL — ABNORMAL HIGH (ref 70–125)
Glucose, Point of Care: 117 MG/DL (ref 70–125)

## 2021-03-28 LAB — PHOSPHORUS, BLOOD: Phosphorus: 3.9 MG/DL (ref 2.5–5.0)

## 2021-03-28 LAB — BILIRUBIN, DIR BLOOD: Bilirubin, Direct: 0.2 mg/dL (ref 0.0–0.2)

## 2021-03-28 MED ORDER — AMLODIPINE 5 MG OR TABS
10.0000 mg | ORAL_TABLET | Freq: Every day | ORAL | Status: DC
Start: 2021-03-28 — End: 2021-03-28
  Administered 2021-03-28 (×2): 10 mg via ORAL
  Filled 2021-03-28: qty 2

## 2021-03-28 MED ORDER — ATORVASTATIN CALCIUM 80 MG OR TABS
80.0000 mg | ORAL_TABLET | Freq: Every day | ORAL | 3 refills | Status: DC
Start: 2021-03-29 — End: 2021-06-19

## 2021-03-28 MED ORDER — ISOSORBIDE MONONITRATE CR 60 MG OR TB24
30.0000 mg | ORAL_TABLET | Freq: Every day | ORAL | Status: DC
Start: 2021-03-28 — End: 2021-03-28
  Administered 2021-03-28: 30 mg via ORAL
  Filled 2021-03-28: qty 1

## 2021-03-28 MED ORDER — METFORMIN HCL 500 MG OR TB24
500.0000 mg | ORAL_TABLET | Freq: Two times a day (BID) | ORAL | 0 refills | Status: DC
Start: 2021-03-28 — End: 2022-05-02

## 2021-03-28 MED ORDER — NITROGLYCERIN 0.4 MG SL SUBL
0.4000 mg | SUBLINGUAL_TABLET | SUBLINGUAL | 0 refills | Status: DC | PRN
Start: 2021-03-28 — End: 2021-06-19

## 2021-03-28 MED ORDER — HYDROCODONE-ACETAMINOPHEN 5-325 MG OR TABS
1.0000 | ORAL_TABLET | ORAL | Status: DC | PRN
Start: 2021-03-28 — End: 2021-03-28

## 2021-03-28 MED ORDER — AMLODIPINE 5 MG OR TABS
5.0000 mg | ORAL_TABLET | Freq: Every day | ORAL | Status: DC
Start: 2021-03-28 — End: 2021-03-28

## 2021-03-28 NOTE — Interdisciplinary (Signed)
03/28/21 1115   Discharge Plan   Living Arrangements * Family Member   Family/Caregiver's Assessed for * Readiness, willingness, and ability to provide or support self-management activities   Respite Care * Not Applicable   Patient/Family/Other Are In Agreement With Discharge Plan * Yes   Patient Has Decision Making Capacity * Yes   Verified phone number for DC location * Yes   Verified address for DC location * Yes   Public Health Clearance Needed * Not Applicable   If Medicare or Medicare Managed Care: Important Message from Medicare Given   If Medicare or Medicare Managed Care: Important Message from Medicare Given Not Applicable   MOON   MOON Provided to Patient Not Applicable   Discharge Planning Needs   Does this patient have CM discharge planning needs? No   Does this patient have SW discharge planning needs? No   Discharge Transportation   Transportation *  Family/Friend   Final Discharge Destination/Services   Final Discharge Destination/Services * Home     Per CCU team, patient is s/p heart cath yesterday. Pt is medically cleared for discharge home today. No needs identified at this time.

## 2021-03-28 NOTE — Plan of Care (Signed)
Problem: Promotion of health and safety  Goal: Promotion of Health and Safety  Description: The patient remains safe, receives appropriate treatment and achieves optimal outcomes (physically, psychosocially, and spiritually) within the limitations of the disease process by discharge.  Outcome: Resolved  Flowsheets  Taken 03/28/2021 1406 by Maryclare Labrador, RN  Outcome Evaluation (rationale for progressing/not progressing) every shift: VSS, he is a/o x4. worked with PT and was able to walk in the hallway. He has been tolerating his diet well.  Individualized Interventions/Recommendations (Discharge Readiness): pt is ready for discharge  Individualized Interventions/Recommendations (Skin/Comfort/Safety/Mobility): encourage pt to ambulate as tolerated  Individualized Interventions/Recommendations (Knowledge): educate pt on maintaining a healthy lifestyle to promote heart health. Educate pt on medication regimen and follow-up appointments.  Taken 03/28/2021 0308 by Ileana Ladd, RN  Standard of Care/Policy:   Telemetry   Pain   Skin   Falls Reduction

## 2021-03-28 NOTE — Plan of Care (Signed)
Problem: Promotion of health and safety  Goal: Promotion of Health and Safety  Description: The patient remains safe, receives appropriate treatment and achieves optimal outcomes (physically, psychosocially, and spiritually) within the limitations of the disease process by discharge.  Outcome: Progressing  Flowsheets (Taken 03/28/2021 0308)  Standard of Care/Policy:  . Telemetry  . Pain  . Skin  . Falls Reduction  Outcome Evaluation (rationale for progressing/not progressing) every shift: Patient is AxOx4. NSR and on RA. BP elevated throughout start of shift. BP being monitored along with headache pain. PRN BP medications provided. BP managed and pain stabilized. Right radial site is clean, dry, and intact after T-band removal. No signs of bleeding/hematoma. Skin is warm and dry, capillary refill less than 3 seconds, and full sensation. Patient's pain being managed PRN. Cardiac diet now effective, patient encouraged to drink fluids. Call light has been placed at the patients bedside for safety and assistance.  Patient /Family stated Goal: Manage blood pressure and pain.  Individualized Interventions/Recommendations (Discharge Readiness): Continue to assess right radial site for any signs of bleeding/hematoma. Continue to monitor for pain.  Individualized Interventions/Recommendations (Skin/Comfort/Safety/Mobility): Patient has been encouraged to reposition every 2 hours. Bed has been placed at the lowest position, and call light placed at patients bedside for safety and assistance.  Individualized Interventions/Recommendations (Knowledge): Educate about looking for signs/symptoms of bleeding.

## 2021-03-28 NOTE — Interdisciplinary (Signed)
Physical Therapy Evaluation    Admitting Physician:  Gordy Levan, MD  Admission Date 03/27/2021    Inpatient Diagnosis:   Problem List       Codes    Impaired functional mobility, balance, gait, and endurance     ICD-10-CM: Z74.09  ICD-9-CM: V49.89          IP Start of Service   Start of Care: 03/28/21  Reason for referral: Activity tolerance limitation;Decline in functional ability/mobility    Preferred Language:Vietnamese    Interpreter Services  Interpreter Services-Rehab: Telephone  Interpreter Name or ID telephone: [place]; [phone]    Past Medical History:   Diagnosis Date   . History of CVA (cerebrovascular accident)    . Hyperlipidemia    . Hypertension    . Type 2 diabetes mellitus (CMS-HCC)       Past Surgical History:   Procedure Laterality Date   . Left knee surgery          PT Acute     Row Name 03/28/21 1300          Type of Visit    Type of Physical Therapy note Physical Therapy Evaluation     Row Name 03/28/21 1300          Treatment Precautions/Restrictions    Precautions/Restrictions Fall     Fall Socks/charm     Other Precautions/Restrictions Information IV, tele     Row Name 03/28/21 1300          Medical History    History of presenting condition Per chart: "Keith Cross is a 60 year old male with PMH significant for hypertension, hyperlipidemia, NIDDM, prior CVA, OSA presenting to ED with chest pain after getting a outpatient stress test today. Patient is endorsing chest pain that started today at 2pm, substernal, 7/10 in severity, radiates to the left shoulder. Associated with shortness of breath. Nitroglycerin spray helped improve the pain. He is not on any blood thinners. He did not take his beta blocker or other hypertensive medications this morning. Denies any fever, chills, lightheadedness, palpitations, n/v/d/c, abdominal pain, dysuria or leg swelling. Walks every day with his small dog for 30 minutes at a time without chest pain or SOB."     Fall history No falls reported in the last 6  months     Row Name 03/28/21 1300          Functional History    Prior Level of Function No deficits     Equipment required for mobility in the home None     Other Functional History Information Pt reports IND PLOF without AD at baseline     [place] Name 03/28/21 1300          Social History    Living Situation Lives with spouse/partner;Lives with parent/family     [place] accessibility  Accessible with wheelchair or walker;Performs activities of daily living (ADL's) on one level     Other Social History Information Pt's wife works and child works, limited support during day.     [place] Name 03/28/21 1300          Subjective    Subjective Information Pt received supine in bed, agreeable to PT session. RN Lovey Newcomer cleared pt for session.     Patient status Patient agreeable to treatment;Nursing in agreement for treatment;Patient pain control adequate to participate in therapy  RN Irven Shelling Name 03/28/21 1300  Pain Assessment    Pain Asssessment Tool Numeric Pain Rating Scale     Row Name 03/28/21 1300          Numeric Pain Rating Scale    Pain Intensity - rating at present 5     Pain Intensity- rating after treatment 5     Location chest pain and HA that is unchanged with positions and activity     Row Name 03/28/21 1300          Objective    Overall Cognitive Status Intact - no cognitive limitations or impairments noted     Communication No communication limitations or impairments noted. Current status of hearing, speech and vision allow functional communication.     Coordination/Motor control No limitations or impairments noted. Movement patterns are fluid and coordinated throughout     Balance Balance limitations present     Static Sitting Balance Normal - able to maintain steady balance without handhold support     Dynamic Sitting Balance Normal - accepts maximal challenge and can shift weight easily within full range in all directions     Static Standing Balance Good - able to maintain  balance without handhold support, limited postural sway     Dynamic Standing Balance Good - accepts moderate challenge, able to maintain balance while picking object off floor     Extremity Assessment Flexibility, strength, muscle tone and sensation grossly within functional limits throughout     Functional Mobility Functional mobility deficits present     Bed Mobility Independent     Transfers to/from Stand Supervised     Transfer Comments without AD, supv for safety given dizziness     Gait Supervised     Gait Comments supv for safety given dizziness and fatigue, shortened step length, slow pace     Device used for ambulation/mobility None     Ambulation Distance 21ft     Other Objective Findings Pt left seated on bedside chair, RN [facility] aware, call light in reach. Vitals stable.                PT Acute Tool Box     Row Name 03/28/21 1300          Cardiopulmonary    Cardiopulmonary Cardiopulmonary: Physiologic response to position change     Row Name 03/28/21 1300          Cardiopulmonary: Physiologic Response to Position Change    Blood Pressure After Lying Supine for 5 Minutes 140/87     Heart Rate After Lying Supine for 5 Minutes 70 beats/minute     Blood Pressure After Sitting for 1 Minute 135/85     Heart Rate After Sitting for 1 Minute 67 beats/minute     Blood Pressure After Standing for 1 Minute 123/88     Heart Rate After Standing for 1 Minute 75 beats/minute     Blood Pressure After Standing for 3 Minutes 144/81  after standing marches     Heart Rate After Standing for 3 Minutes 80 beats/minute     Assessment: Physiologic Response to Position Change Dizziness present with position change  Seated at end of session: 137/97 mmHg                    Eval cont.     [place] Name 03/28/21 1300          [place] AM-PAC: Basic Mobility    Assistance Needed to Turn from Back to Side While in a  Flat Bed Without Using Bedrails 4 - None (independent)     Difficulty with Supine to Sit Transfer 4 - None (independent)     How  Much Help Needed to Move to/from Bed to Chair 3 - A little (supervised/min assist)     Difficulty with Sit to Stand Transfer from Chair with Arms 3 - A little (supervised/min assist)     How Much Help Needed to Walk in Room 3 - A little (supervised/min assist)     How Much Help Needed to Climb 3-5 Steps with a Rail 3 - A little (supervised/min assist)     AMPAC Total Score 20     Assessment: AM-PAC Basic Mobility Impairment Rating Score 19-22 - 20-39% impaired     Row Name 03/28/21 1300          Patient/Family Education    Learner(s) Patient     Learner response to rehab patient education interventions Verbalizes understanding     Patient/family training comments Progressive mobility, PT POC     Row Name 03/28/21 1300          Assessment    Assessment Pt is a 60 year old male presenting with chest pain, heart cath negative and no intervention indicated. Pt reports IND PLOF without AD at baseline. Pt currently is IND for bed mobility, supv for transfers, and supv for gait x 23ft without AD. Pt reports fatigue limits further mobility. Pt is also dizzy with position change and improved with mobility and attempts in venous return activities. Pt to benefit from skilled inpatient PT at low frequency to improve endurance and functional independence. Pt to continue to benefit from OOB mobility at least TID with nursing assist during remainder of acute stay to prevent deconditioning while maximizing safety.     Rehab Potential Good     Row Name 03/28/21 1300          Patient stated Goal    Patient stated goal None stated     [place] Name 03/28/21 1300          Goal 1 (Short Term)    Impairment Functional mobility limitation     Custom goal Pt will complete all functional transfers IND.     Number of visits 3     Goal Status New     Row Name 03/28/21 1300          Goal 2 (Short Term)    Impairment Gait impairment     Custom goal Pt will ambulate at least 115ft, IND.     Number of visits 3     Goal Status New     Row Name 03/28/21  1300          GOAL (Long Term)    Long Term Goal Impairment Other impairment     Long Term Custom Goal Pt will return to IND PLOF within 1 month.     Long Term Goal Status New     Row Name 03/28/21 1300          Planned Therapy Interventions and Rationale    Gait Training to normalize gait pattern and improve safety while ambulating;to normalize gait pattern and improve safety while ambulating with assistive device;to improve safety with stair navigation     Neuromuscular Re-Education to improve safety during dynamic activities;to improve kinesthetic awareness and postural control;to normalize muscle tone and coordination     Therapeutic Activities to improve functional mobility and ability to navigate in the home and/or community;to  improve transfers between surfaces;to restore functional performace using graded activities     Theraputic Exercise to increase range of motion to allow greater independence with functional mobility skills;to increase strength to allow greater independence with functional mobility skills;to improve activity tolerance to allow greater independence with functional mobility skills     Row Name 03/28/21 1300          Treatment Plan Disussion    Treatment Plan Discussion and Agreement Patient/family/caregiver stated understanding and agreement with the therapy plan;No family/caregiver available     Row Name 03/28/21 1300          Treatment Plan    Continue therapy to address Activity tolerance limitation;Decline in functional ability/mobility     Frequency of treatment 3 times per week     Duration of treatment (number of visits) While patient is hospitalized and in need of skilled therapy services     Plan of Care Review Date  04/04/21     Status of treatment Patient evaluated and will benefit from ongoing skilled therapy     [place] Name 03/28/21 1300          Patient Safety Considerations    Patient safety considerations Patient left sitting at end of treatment;Call light left in reach and  fall precautions in place;Patient left  in appropriate pressure relieving position;Patient may be at risk for falls;Nursing notified of safety considerations at end of treatment     Patient assistive device requirements for safe ambulation No device required     Other Patient Safety Considerations Information RN Lovey Newcomer aware pt left sitting     Row Name 03/28/21 1300          Therapy Plan Communication    Therapy Plan Communication Discussed therapy plan with Nursing and/or Physician;Encouraged out of bed with assistance by     Encouraged out of bed with assistance by Staff     Row Name 03/28/21 1300          Physical Therapy Patient Discharge Instructions    Your Physical Therapist suggests the following Continue to use correct body mechanics when moving in and out of bed as instructed;Supervision with walking is suggested for increased safety;Continue to follow your prescribed mobility precautions when moving in and out of bed and walking  as instructed     Row Name 03/28/21 1300          Type of Eval    Low Complexity [phone]) Completed     Row Name 03/28/21 1300          Therapeutic Procedures    Gait Training [phone]) Dynamic activities while walking;Gait pattern analysis and treatment of deviations;Patient education;Postural alighnment/biomechanic training during gait;Weight shift and postural control activities during gait        Total TIMED Treatment (min)  15       Gait Training  Facilitation of safety with gait without AD     Therapeutic Activities [phone])  Assistance/facilitation of bed mobility;Dynamic activities to improve performance of  functional tasks/activities;Facillitation of safety awareness/responses during functional tasks;Transfer training with weight shift and direction change;Weight shift activities to improve safety in unsupported sitting or standing;Patient education;Progressive mobilization to improve functional independence;Functional activities;Graded physical assist for functional  activities;Gross motor control activities        Total TIMED Treatment (min)  15       Therapeutic Activities  Vitals monitoring, facilitation of safety with bed mobility, and transfers     [place] Name 03/28/21 1300  Treatment Time     Total TIMED Treatment  (min) 45     Total Treatment Time (min) 75     Treatment start time 0900               Post Acute Discharge Recommendations  Discharge Rehabilitation Reccomendations [facility] ONLY): Would benefit from intermittent Physical therapy post acute discharge to maximize functional independence;1-3 times per week;Home safety  Equipment recommendations: No equipment needed - patient has own equipment    The physical therapist of record is endorsed by evaluating physical therapist.

## 2021-03-28 NOTE — Discharge Summary (Signed)
DISCHARGE SUMMARY     Patient Name:  Keith Cross    Date of Admission:  03/27/2021  Date of Discharge:  03/28/2021    Follow Up Recommendations:  - Please start taking metformin 500mg  twice a day  - Increase atorvastatin (Lipitor) to 80mg  daily  - Start taking aspirin 81mg  daily   - Please follow your medication regimen as detailed below  - Please follow-up with your cardiologist   - Please follow-up with your PCP in 1-2 weeks  - Should you experience any new or worsening symptoms, please report to the nearest emergency department or seek medical attention immedately.  - You will be called in regards to future appointments  - If you do not receive a call for an appointment within 10 days, please call [phone] to schedule an appointment  - Please see your upcoming future appointments below    Follow Up Appointments:  Scheduled appointments:  Future Appointments   Date Time Provider [place]   04/16/2021  1:00 PM Keith Cross [facility] [place] ROOM 1 [place] PLZ [place]   04/17/2021 11:00 AM Keith Cross [facility] [place]   04/17/2021  1:30 PM Keith Half, Cross  PLZCRD2 [place]-[facility]   01/29/2022  1:20 PM Keith Cross  PLZEND2 [place]-[facility]             Principal Diagnosis:  Chest pain     Hospital Problem List:  Active Hospital Problems    Diagnosis   . *Chest pain [R07.9]   . Abnormal stress echo [R94.39]      Resolved Hospital Problems   No resolved problems to display.       Procedures During This Hospitalization:  LHC on 03/27/2021:  CONCLUSIONS:   1. Mid LAD with 50% stenosis. RFR of mid LAD was 0.95. This was physiologically non-significant.   2. LVEDP 20-25 mm Hg.    RECOMMENDATIONS:   1. Maximize medical therapy.   2. Aggressive risk factor modification efforts.   3. Monitor vitals and arterial access site/pulses.    Consultations Obtained During This Hospitalization:  None    History of Present Illness:  Keith Cross is a 59 year old male with PMH significant for hypertension, hyperlipidemia, NIDDM,  prior CVA, OSA presenting to ED with chest pain after getting a outpatient stress test today. Patient is endorsing chest pain that started today at 2pm, substernal, 7/10 in severity, radiates to the left shoulder. Associated with shortness of breath. Nitroglycerin spray helped improve the pain. He is not on any blood thinners. He did not take his beta blocker or other hypertensive medications this morning. Denies any fever, chills, lightheadedness, palpitations, n/v/d/c, abdominal pain, dysuria or leg swelling. Walks every day with his small dog for 30 minutes at a time without chest pain or SOB.    ED Course: BP 148/101, HR 114, RR 20. Received aspirin 325mg  x 1.     Hospital Course by Problem:  EKG shows sinus tachycardia with new T wave inversions on lead II, III, aVF. Trop HS was negative. Patient was loaded on aspirin 325mg . Patient consented and taken to cath lab. LHC showed mid LAD with 50% stenosis. RFR of mid LAD was 0.95. This was physiologically non-significant. Chest pain with abnormal stress echo would be due to microvascular disease. Patient also endorses increased stress from his work so can also be an anxiety component. Patient tolerated procedure without bleeding. Patient is chest pain free at the time of discharge.   New Discharge medications changes:  -  Start aspirin 81mg  daily   - Increase Lipitor from 40mg  to 80mg  daily  - Start metformin 500mg  BID for diabetes     # Chest pain - substernal, radiating, relieved with nitroglycerin spray. EKG with new T wave inversion on lead II, III. AvF. LHC with non-obstructive CAD. Likely microvascular disease     # Resistant Hypertension - Continue on losartan 100mg , metoprolol XL 50mg , amlodipine 10mg  daily, and hydrochlorothiazide 25mg  daily     # Hyperlipidemia - on atorvastatin 40mg  daily at home, increased to 80mg  daily    # NIDDM - Last A1c 7.4% on 01/24/2021. Previously on metformin but currently not on any diabetic meds. A1c is 7.2. Discharge with  metformin 500mg  BID     # OSA - CPAP machine from [place] seeking a new sleep medicine doctor      # Prior CVA - on atorvastatin, not on aspirin at home. Start aspirin 81mg  daily       Hospital Events:  As above     Follow Up Recommendations  Please follow up outpatient with your PCP and cardiologist.     Tests Outstanding at Discharge Requiring Follow Up:  None    Discharge Condition:  Good.    Key Physical Exam Findings at Discharge:  Temperature:  [97.5 F (36.4 C)-98 F (36.7 C)] 97.52 F (36.4 C) (05/19 0358)  Blood pressure (BP): (117-171)/(84-119) 117/84 (05/19 0358)  Heart Rate:  [58-114] 66 (05/19 0358)  Respirations:  [16-20] 16 (05/19 0358)  Pain Score: Patient Sleeping, Respiratory Assessment Done (05/19 0358)  O2 Device: None (Room air) (05/19 0358)  SpO2:  [95 %-99 %] 97 % (05/19 0358)    Gen: In no acute distress. A&Ox4  Lungs:  CTA (B)     Heart:  regular, S1 S2, no murmurs  Abdomen:  soft, non-tender, normo-active bowel sounds  Extremities:  2+ pulses, no edema    Discharge Diet:  Diet Order Therapeutic; Cardiac, Consistent Carb (Diabetic); Mod 180-225gm (1600-1900 cal)    Discharge Medications:     What To Do With Your Medications      START taking these medications      Add'l Info   metFORMIN 500 mg XR tablet  Commonly known as: GLUCOPHAGE XR  Take 1 tablet (500 mg) by mouth 2 times daily.   Quantity: 60 tablet  Refills: 0        CHANGE how you take these medications      Add'l Info   atorvastatin 80 MG tablet  Commonly known as: LIPITOR  Take 1 tablet (80 mg) by mouth daily.  Start taking on: Mar 29, 2021   Quantity: 90 tablet  Refills: 3  What changed:    medication strength   how much to take        CONTINUE taking these medications      Add'l Info   amLODIPINE 10 MG tablet  Commonly known as: NORVASC  Take 1 tablet (10 mg) by mouth daily.   Quantity: 90 tablet  Refills: 3     aspirin 81 MG EC tablet  Take 81 mg by mouth daily.   Refills: 0     colchicine 0.6 MG tablet  Take 0.6 mg by  mouth 2 times daily.   Refills: 0     hydrochlorothiazide 25 MG tablet  Commonly known as: HYDRODIURIL  Take 1 tablet (25 mg) by mouth daily.   Quantity: 90 tablet  Refills: 3     losartan 100 MG tablet  Commonly known as: COZAAR  Take 100 mg by mouth daily.   Refills: 0     metoprolol succinate 50 MG XL tablet  Commonly known as: TOPROL XL  Take 1 tablet (50 mg) by mouth daily.   Quantity: 90 tablet  Refills: 3        STOP taking these medications    isosorbide mononitrate 30 MG Controlled-Release tablet  Commonly known as: IMDUR     pioglitazone 30 mg tablet  Commonly known as: ACTOS     potassium chloride 10 MEQ Sustained-Release tablet  Commonly known as: KLOR-CON           Where to Get Your Medications      These medications were sent to [facility] [address]  [place], [place] [address]    Phone: [phone]    atorvastatin 80 MG tablet   metFORMIN 500 mg XR tablet           Allergies:  No Known Allergies    MDRO Status: Negative    Discharge Disposition:  Home    Discharge Code Status:  Full code / full care  This code status is not changed from the time of admission.    Advance Directive on File?  No    Follow Up Appointments:  Scheduled appointments:  Future Appointments   Date Time Provider [place]   04/16/2021  1:00 PM [place] [facility] [place] RO[place] 1 [place] PLZ [place]   04/17/2021 11:00 AM Keith Cross [facility] [place]   04/17/2021  1:30 PM Keith Half, Cross [place] PLZCRD2 [place]-[facility]   01/29/2022  1:20 PM Keith Cross [place] PLZEND2 [place]-GOT[facility]       For appointments requested for after discharge that have not yet been scheduled, refer to the [place] Discharge Referrals section of the After Visit Summary.    Discharging 49 Contact Information:   Discharging Physician's Contact Information: [facility] at [phone]    Genice Rouge, Cross  03/28/21 7:23 AM

## 2021-03-28 NOTE — Interdisciplinary (Signed)
[facility] Nurse's  After Visit Summary (AVS) and Discharge Instructions completed in Hospitality Lounge by RN. Patient opted to complete instructions in the lounge and deferred use of consultation room. Patient escorted to car via wheelchair by [employer].

## 2021-04-01 LAB — ACT K (ISTAT) POINT OF CARE TESTING: ACT K (iSTAT) POC: 160 SEC. — ABNORMAL HIGH (ref 74–137)

## 2021-04-01 NOTE — Telephone Encounter (Signed)
Sandie Ano, RN    Registered Nurse      Interdisciplinary     Sign when Signing Visit   Date of Service:  03/27/2021 2:00 PM            Patient complained of CP 10/10 at the peak of treadmill stress test. BP 195/111 HR 150 bpm. Stress test stopped and Nitro spray x 3 given sublingual. Chest pain improved to 2/10. Cardiology Fellow Dr. Cherlyn Cushing and Cardiology attending Dr. Lonzo Cloud made aware and recommended patient transfer to ED and for possible Angiogram today    04/01/2021 11:01 AM Phone (Outgoing) [place], [place] [facility]) [phone] (M) Remove   Left Message - LEFT VOICEMAIL to call us back so we can answer her question of who sent pt to ER.      By Gerri Lins, RN        04/01/2021 11:03 AM Phone (Outgoing) [place], [place]) [phone] (H) Remove   Left Message - LEFT VOICEMAIL to call us back so we can answer her question of who sent pt to ER.      By Gerri Lins, RN

## 2021-04-10 NOTE — Progress Notes (Signed)
Keith Cross, Nurse Practitioner  [facility]   [place], [place] [address]  P: [phone] F: [phone]    Date of Evaluation: 04/11/2021  Referring Physician: Vernell Leep  Primary Care Physician: Glenford Peers Huu    Chief Complaint: headache    History of Present Illness:  Keith Cross is a 60 year old, 61 male with OSA, HTN (uncontrolled despite medication) seen for evaluation of headaches. Visit was conducted using [employer] [name] interpreter [place] [phone].      Headaches present x10 years, current average frequency=15-20 days/month.  Present upon waking and frequently wakes him from sleep during the night. Would last all day if left untreated but is relieved with ASA-takes 2 $RemoveBef'81mg'VoLzjRWeDG$  tablets, which relieves pain for 2-3 hours, then headache returns. Has not tried anything to treat. Describes pain in middle of forehead and back of head/neck as sharp, makes him feel dizzy.   Associated photophobia, phonophobia, blurred vision, fatigue, nausea. Denies autonomic features.   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  He is being followed by Cardiology for management of blood pressure, which has remained elevated despite multiple anti-hypertensives.   Doesn't drink caffeine.  Mother passed from a stroke, his father passed from cancer. He has an older and younger brother, both of whom get headaches.     Referred to sleep medicine by cardiologist. Previously, while living in [place] underwent sleep study, was advised he stopped breathing for up to a minute, sometimes multiple times/hour. Was using CPAP until he moved to the [place] years ago. Does not have one now, but wakes up in the middle of the night out of breath.    Failed meds: metoprolol    Past Medical History:  Patient Active Problem List   Diagnosis   . Chest pain   . Abnormal stress echo       Past Surgical  History:   Past Surgical History:   Procedure Laterality Date   . Left knee surgery         Social History  Social History     Socioeconomic History   . Marital status: Married     Spouse name: Not on file   . Number of children: Not on file   . Years of education: Not on file   . Highest education level: Not on file   Occupational History   . Not on file   Tobacco Use   . Smoking status: Never Smoker   . Smokeless tobacco: Never Used   Substance and Sexual Activity   . Alcohol use: Not on file   . Drug use: Not on file   . Sexual activity: Not on file   Other Topics Concern   . Not on file   Social History Narrative   . Not on file     Social Determinants of Health     Financial Resource Strain: Not on file   Food Insecurity: Not on file   Transportation Needs: Not on file   Physical Activity: Not on file   Stress: Not on file   Social Connections: Not on file   Intimate Partner Violence: Not on file   Housing Stability: Not on file       Family History:  Mother-stroke  Father-cancer    Home Medications:  Outpatient Medications Marked as Taking for the 04/11/21 encounter (Office  Visit) with Elesa Massed, NP   Medication Sig Dispense Refill   . amLODIPINE (NORVASC) 10 MG tablet Take 1 tablet (10 mg) by mouth daily. 90 tablet 3   . aspirin 81 MG EC tablet Take 81 mg by mouth daily.     Marland Kitchen atorvastatin (LIPITOR) 80 MG tablet Take 1 tablet (80 mg) by mouth daily. 90 tablet 3   . cloNIDine (CATAPRES) 0.1 MG tablet      . colchicine 0.6 MG tablet Take 0.6 mg by mouth 2 times daily.     . famotidine (PEPCID) 40 MG tablet Take 40 mg by mouth daily.     . furosemide (LASIX) 20 MG tablet      . hydrALAZINE (APRESOLINE) 100 MG tablet Take 100 mg by mouth 3 times daily.     . hydrochlorothiazide (HYDRODIURIL) 25 MG tablet Take 1 tablet (25 mg) by mouth daily. 90 tablet 3   . HYDROcodone-acetaminophen (NORCO) 10-325 MG tablet Take 1 tablet by mouth every 6 hours as needed.     Marland Kitchen losartan (COZAAR) 100 MG tablet Take 100 mg by mouth  daily.     Marland Kitchen losartan (COZAAR) 25 MG tablet Take 25 mg by mouth daily.     . metFORMIN (GLUCOPHAGE XR) 500 mg XR tablet Take 1 tablet (500 mg) by mouth 2 times daily. 60 tablet 0   . metFORMIN (GLUCOPHAGE) 850 MG tablet      . metoprolol succinate (TOPROL XL) 50 MG XL tablet Take 1 tablet (50 mg) by mouth daily. 90 tablet 3   . naloxone (NARCAN) 4 mg/0.1 mL nasal spray      . nitroGLYcerin (NITROSTAT) 0.4 MG SL tablet 1 tablet (0.4 mg) by Sublingual route every 5 minutes as needed for Chest Pain. up to 3 tabs per episode. 1 each 0   . ondansetron (ZOFRAN) 8 MG tablet TAKE 1 TABLET BY MOUTH THREE TIMES DAILY AS NEEDED     . spironolactone (ALDACTONE) 50 MG tablet Take 50 mg by mouth daily.     . TRUEplus Lancets 30G MISC 2 times daily.     . VELPHORO 500 MG CHEW CHEW AND SWALLOW 2 TABLETS THREE TIMES DAILY WITH MEALS       Current Facility-Administered Medications for the 04/11/21 encounter (Office Visit) with Elesa Massed, NP   Medication Dose Route Frequency Provider Last Rate Last Admin   . nitroGLYcerin (NITROLINGUAL) spray 1 spray  1 spray Sublingual Q5 Min PRN Paul Half, MD   3 spray at 03/27/21 1439       Allergies:   No Known Allergies     Review of Systems: 12 point Review of Systems is negative other than what is noted above.      Physical Exam:  BP (!) 159/94 (BP Location: Left arm, BP Patient Position: Sitting, BP cuff size: Large)   Pulse 79   Temp 98.4 F (36.9 C) (Temporal)   Resp 16   Ht $R'5\' 7"'al$  (1.702 m)   Wt 79.3 kg (174 lb 13.2 oz)   BMI 27.38 kg/m   General Exam:   Head, Eyes, Ears, Nose, Throat: Normocephalic, atraumatic.   Neck: Supple   Extremities: Warm and well-perfused, no clubbing, cyanosis, or edema.   Neurologic Exam:   Mental Status: Awake and alert, oriented x 3. Speech is fluent and prosodic.   Cranial Nerves:   Cranial Nerve 2: Pupils equal, round, reactive to light. Visual Fields full to confrontation bilaterally.   Cranial Nerves 3, 4,  6: Extraocular movements are intact with no  nystagmus.   Cranial Nerve 5: Facial sensation intact to light touch in V1, V2, V3 bilaterally.   Cranial Nerve 7: Facial power intact bilaterally to tight eye closure, eye brow lift, cheek puff. Asymmetry noted with smile.  Cranial Nerve 8: Hearing is intact to finger rub bilaterally.   Cranial Nerves 9, 10: Palate elevates, uvula midline.   Cranial Nerve 11: Shoulder shrug and head turn are 5/5 on right, 4/5 on left  Cranial Nerve 12: Tongue is midline.   Extra pyramidal: No rigidity, bradykinesia, or resting tremor  Motor: Normal bulk and tone. Power is 5/5 right deltoids, biceps, triceps, wrist extension, wrist flexion, interosseous, grip, iliopsoas, hamstring, quadriceps, dorsiflexion, plantarflexion. 4/5 on left side  Coordination: Finger-nose-finger, fine finger movements, hand opening-and-closing, rapid alternative movements, heel tap, and heel-knee-shin intact on right, decreased on left.  Sensory: Intact to light touch in all four limbs.   Reflexes: 2+ symmetric bilateral biceps, brachioradialis, triceps, patellae, and ankle jerks.   Gait: Negative Romberg. Negative pull test.     Labs:   Albumin   Date Value Ref Range Status   03/28/2021 3.9 (L) 4.2 - 5.5 G/DL Final     ALT   Date Value Ref Range Status   03/28/2021 39 7 - 52 U/L Final     AST   Date Value Ref Range Status   03/28/2021 23 13 - 39 U/L Final     BUN   Date Value Ref Range Status   03/28/2021 16 7 - 25 mg/dL Final     Calcium   Date Value Ref Range Status   03/28/2021 9.0 8.6 - 10.3 mg/dL Final     Chloride   Date Value Ref Range Status   03/28/2021 104 98 - 107 mmol/L Final     Cholesterol   Date Value Ref Range Status   03/27/2021 164 <200 MG/DL Final     Comment:     Desirable: <200 mg/dL  Borderline High: 200-239 mg/dL  High: >240 mg/dL       CO2   Date Value Ref Range Status   03/28/2021 25 21 - 31 mmol/L Final     Creat   Date Value Ref Range Status   03/28/2021 1.1 0.7 - 1.3 mg/dL Final     eGFR - low estimate   Date Value Ref Range  Status   03/28/2021 >60 >59 Final     eGFR - high estimate   Date Value Ref Range Status   03/28/2021 >60 >59 Final     Comment:     (Unit: mL/min/1.73 sq mtr)  The calculated GFR is an estimate and is NOT an accurate reflection of GFR in   patients on dialysis. The accuracy of estimated GFR is also affected by muscle   mass, and medications that affect renal tubular secretion of creatinine.  If estimated GFR will directly affect clinical decision making, consider using cystatin C to estimate GFR, and/or consult with nephrology. eGFR was calculated using the MDRD equation (2006).       Glucose   Date Value Ref Range Status   03/28/2021 126 (H) 85 - 125 mg/dL Final     Comment:        Normal Fasting Glucose:  <100 mg/dL  Impaired Fasting Glucose: 100-125 mg/dL  Provisional DX of diabetes(must be confirmed) >125 mg/dL       Hgb   Date Value Ref Range Status   03/28/2021 15.0 13.5 -  16.9 G/DL Final     Glycated Hgb, A1C   Date Value Ref Range Status   03/27/2021 7.2 (H) 4.6 - 5.6 % Final     Comment:     (NOTE)  Reference values for HgA1c:        Non-Diabetic: 4.6 - 5.6%    HbA1c cutoffs for assessing diabetes:      Increased risk for diabetes: 5.7 - 6.4%      Diabetes:  >6.4%    ADA recommended HbA1c goals in treatment of diabetes:      Adults: <7.0%      Children and adolescents with type I Diabetes: <7.5%      Comment: A lower goal of <7.0% is reasonable if it      can be achieved without excessive hypoglycemia.      Goals should be individualized.    Reference: Diabetes Care January 2017.       Magnesium   Date Value Ref Range Status   03/28/2021 2.0 1.9 - 2.7 mg/dL Final     Phosphorus   Date Value Ref Range Status   03/28/2021 3.9 2.5 - 5.0 MG/DL Final     PLT Count   Date Value Ref Range Status   03/28/2021 195 150 - 400 THOUS/MCL Final     Potassium   Date Value Ref Range Status   03/28/2021 3.9 3.5 - 5.1 mmol/L Final     Sodium   Date Value Ref Range Status   03/28/2021 138 136 - 145 mmol/L Final      Triglycerides   Date Value Ref Range Status   03/27/2021 158 (H) <150 MG/DL Final     Comment:     Normal: <150 mg/dL  Borderline High: 150-199 mg/dL  High: 200-499 mg/dL  Very high: > or = 500 mg/dL       No results found for: COLORUA, APPEARUA, GLUCOSEUA, BILIUA, KETONEUA, SGUA, BLOODUA, PHUA, PROTEINUA, UROBILUA, NITRITEUA, LEUKESTUA, WBCUA, RBCUA, EPITHCELLSUA, HYALINEUA, CRYSTALSUA, COMMENTSUA    Assessment and plan:  Mr. Bonneau is a 60 year old, [name] speaking man with headaches upon waking for the past 10 years. The timing coincides with when patient stopped using CPAP. Cannot rule out migraine given associated symptoms.     1. Start nortriptyline 10mg  1 capsule at bedtime. Discussed potential side effects-notify office should they occur.  2. Continue aspirin as needed  3. MRI/MRA ordered  4. Follow up as scheduled with Sleep Specialist  5. Follow up as schedule with Cardiology  6. Follow up in this clinic in 3 months    Approximately 60 minutes was spent by me in the direct clinical care of this patient.  Over 50% of this time was spent in the process of counseling and coordination of care, to include face-to-face time with the patient and/or family members present.      Thank you again for allowing me to participate in the care of your patient.

## 2021-04-11 ENCOUNTER — Encounter: Payer: Self-pay | Admitting: Nurse Practitioner

## 2021-04-11 ENCOUNTER — Ambulatory Visit: Payer: BLUE CROSS/BLUE SHIELD | Attending: Nurse Practitioner | Admitting: Nurse Practitioner

## 2021-04-11 VITALS — BP 159/94 | HR 79 | Temp 98.4°F | Resp 16 | Ht 67.0 in | Wt 174.8 lb

## 2021-04-11 DIAGNOSIS — G43009 Migraine without aura, not intractable, without status migrainosus: Secondary | ICD-10-CM | POA: Insufficient documentation

## 2021-04-11 DIAGNOSIS — R519 Headache, unspecified: Secondary | ICD-10-CM

## 2021-04-11 DIAGNOSIS — G4733 Obstructive sleep apnea (adult) (pediatric): Secondary | ICD-10-CM | POA: Insufficient documentation

## 2021-04-11 DIAGNOSIS — Z8673 Personal history of transient ischemic attack (TIA), and cerebral infarction without residual deficits: Secondary | ICD-10-CM

## 2021-04-11 DIAGNOSIS — Z6827 Body mass index (BMI) 27.0-27.9, adult: Secondary | ICD-10-CM

## 2021-04-11 MED ORDER — NALOXONE HCL 4 MG/0.1ML NA LIQD
NASAL | Status: DC
Start: 2021-01-28 — End: 2022-01-13

## 2021-04-11 MED ORDER — CLONIDINE HCL 0.1 MG OR TABS
ORAL_TABLET | Freq: Every day | ORAL | Status: DC
Start: 2021-04-04 — End: 2021-08-28

## 2021-04-11 MED ORDER — FAMOTIDINE 40 MG OR TABS
40.0000 mg | ORAL_TABLET | Freq: Every day | ORAL | Status: DC
Start: 2021-02-13 — End: 2022-01-13

## 2021-04-11 MED ORDER — METFORMIN HCL 850 MG OR TABS
ORAL_TABLET | ORAL | Status: DC
Start: 2021-04-04 — End: 2022-01-13

## 2021-04-11 MED ORDER — FUROSEMIDE 20 MG OR TABS
ORAL_TABLET | ORAL | Status: AC
Start: 2021-01-31 — End: ?

## 2021-04-11 MED ORDER — TRUEPLUS LANCETS 30G MISC
Freq: Two times a day (BID) | Status: DC
Start: 2021-01-22 — End: 2022-01-13

## 2021-04-11 MED ORDER — HYDROCODONE-ACETAMINOPHEN 10-325 MG OR TABS
1.0000 | ORAL_TABLET | Freq: Four times a day (QID) | ORAL | Status: DC | PRN
Start: 2021-01-28 — End: 2022-01-13

## 2021-04-11 MED ORDER — HYDRALAZINE HCL 100 MG OR TABS
100.0000 mg | ORAL_TABLET | Freq: Three times a day (TID) | ORAL | Status: DC
Start: 2021-01-26 — End: 2021-08-28

## 2021-04-11 MED ORDER — VELPHORO 500 MG PO CHEW
CHEWABLE_TABLET | ORAL | Status: DC
Start: 2021-01-28 — End: 2022-01-13

## 2021-04-11 MED ORDER — ONDANSETRON HCL 8 MG OR TABS
ORAL_TABLET | ORAL | Status: DC
Start: 2021-02-13 — End: 2022-01-13

## 2021-04-11 MED ORDER — LOSARTAN POTASSIUM 25 MG OR TABS
25.0000 mg | ORAL_TABLET | Freq: Every day | ORAL | Status: DC
Start: 2021-01-30 — End: 2021-04-17

## 2021-04-11 MED ORDER — NORTRIPTYLINE HCL 10 MG OR CAPS
10.0000 mg | ORAL_CAPSULE | Freq: Every evening | ORAL | 6 refills | Status: DC
Start: 2021-04-11 — End: 2021-06-04

## 2021-04-11 MED ORDER — SPIRONOLACTONE 50 MG OR TABS
50.0000 mg | ORAL_TABLET | Freq: Every day | ORAL | Status: DC
Start: 2021-01-26 — End: 2021-04-17

## 2021-04-11 NOTE — Patient Instructions (Addendum)
Start nortriptyline 10mg  1 capsule at bedtime  Continue aspirin as needed  MRI/MRA ordered  Follow up as scheduled with Sleep Specialist  Follow up as schedule with Cardiology  Follow up in this clinic in 3 months

## 2021-04-15 NOTE — Progress Notes (Signed)
[facility] Note    Patient: Marke Goodwyn  Patient DOB: 1961/04/03  Patient ID: [id number]  Patient PCP: Emmaline Kluver  Date of Service: 04/17/2021    Dr. Emmaline Kluver, as you know,    Reason for Visit  Follow Up       HPI  Moses Odoherty is a 60 year old male    Interval History  Last seen 03/13/21.  In the interim, underwent exercise stress echocardiogram which was abnormal.  He subsequently underwent coronary angiogram which showed 50% mid LAD stenosis, negative by RFR    He reports that he he continued to have chest pain, takes nitroglycerin which has some relief but not complete.  Blood pressures at home measuring 196Q-229N systolic.       Problem List  Patient Active Problem List    Diagnosis Date Noted   . Chest pain 03/27/2021   . Abnormal stress echo 03/27/2021        PMH  Past Medical History:   Diagnosis Date   . History of CVA (cerebrovascular accident)    . Hyperlipidemia    . Hypertension    . Type 2 diabetes mellitus (CMS-HCC)        Meds  Current Outpatient Medications   Medication Sig   . amLODIPINE (NORVASC) 10 MG tablet Take 1 tablet (10 mg) by mouth daily.   Marland Kitchen aspirin 81 MG EC tablet Take 81 mg by mouth daily.   Marland Kitchen atorvastatin (LIPITOR) 80 MG tablet Take 1 tablet (80 mg) by mouth daily.   . cloNIDine (CATAPRES) 0.1 MG tablet    . colchicine 0.6 MG tablet Take 0.6 mg by mouth 2 times daily.   . famotidine (PEPCID) 40 MG tablet Take 40 mg by mouth daily.   . hydrALAZINE (APRESOLINE) 100 MG tablet Take 100 mg by mouth 3 times daily.   . hydrochlorothiazide (HYDRODIURIL) 25 MG tablet Take 1 tablet (25 mg) by mouth daily.   Marland Kitchen HYDROcodone-acetaminophen (NORCO) 10-325 MG tablet Take 1 tablet by mouth every 6 hours as needed.   Marland Kitchen losartan (COZAAR) 100 MG tablet Take 1 tablet (100 mg) by mouth daily.   Marland Kitchen losartan (COZAAR) 100 MG tablet Take 100 mg by mouth daily.   . metFORMIN (GLUCOPHAGE XR) 500 mg XR tablet Take 1 tablet (500 mg) by mouth 2 times daily.   . metFORMIN (GLUCOPHAGE) 850 MG  tablet    . metoprolol succinate (TOPROL XL) 50 MG XL tablet Take 1 tablet (50 mg) by mouth daily.   . naloxone (NARCAN) 4 mg/0.1 mL nasal spray    . nitroGLYcerin (NITROSTAT) 0.4 MG SL tablet 1 tablet (0.4 mg) by Sublingual route every 5 minutes as needed for Chest Pain. up to 3 tabs per episode.   . nortriptyline (PAMELOR) 10 MG capsule Take 1 capsule (10 mg) by mouth nightly.   . ondansetron (ZOFRAN) 8 MG tablet TAKE 1 TABLET BY MOUTH THREE TIMES DAILY AS NEEDED   . spironolactone (ALDACTONE) 25 MG tablet Take 1 tablet (25 mg) by mouth daily.   . TRUEplus Lancets 30G MISC 2 times daily.   . VELPHORO 500 MG CHEW CHEW AND SWALLOW 2 TABLETS THREE TIMES DAILY WITH MEALS     Current Facility-Administered Medications   Medication   . nitroGLYcerin (NITROLINGUAL) spray 1 spray       All  No Known Allergies    ROS  A comprehensive review of 12 organ systems was performed and reviewed with the patient.  Review of systems with the patient was negative except as indicated in the HPI above.    SH  Social History     Socioeconomic History   . Marital status: Married     Spouse name: Not on file   . Number of children: Not on file   . Years of education: Not on file   . Highest education level: Not on file   Occupational History   . Not on file   Tobacco Use   . Smoking status: Never Smoker   . Smokeless tobacco: Never Used   Substance and Sexual Activity   . Alcohol use: Not on file   . Drug use: Not on file   . Sexual activity: Not on file   Other Topics Concern   . Not on file   Social History Narrative   . Not on file     Social Determinants of Health     Financial Resource Strain: Not on file   Food Insecurity: Not on file   Transportation Needs: Not on file   Physical Activity: Not on file   Stress: Not on file   Social Connections: Not on file   Intimate Partner Violence: Not on file   Housing Stability: Not on file     FH  No family history on file.    Past Surgical History:  No date: Left knee surgery     Physical  Exam  BP (!) 171/106   Pulse 74   Resp 16   Ht $R'5\' 7"'yN$  (1.702 m)   Wt 78.8 kg (173 lb 13.3 oz)   BMI 27.23 kg/m   Gen: pleasant, in NAD  Neuro: A&Ox3, no focal deficit  HEENT:  NC/AT. MMM  Neck: no bruit. JVD   CV: regular rate, normal rhythm, S1 S2   Resp: clear, no rales  Abd:  NABS soft ntnd  Ext: no c/c/e  Vasc: 2+ radial, DP pulses. Cap refill < 3 sec. Periphery warm    Labs  Lab Results   Component Value Date    RBC 5.34 03/28/2021    HGB 15.0 03/28/2021    HCT 46.0 03/28/2021    MCV 86.2 03/28/2021    MCHC 32.7 03/28/2021    RDW 14.5 (H) 03/28/2021    PLT 195 03/28/2021     Lab Results   Component Value Date    BUN 16 03/28/2021    CREAT 1.1 03/28/2021    CL 104 03/28/2021    K 3.9 03/28/2021    [place] 9.0 03/28/2021    TBILI 0.9 03/28/2021    ALB 3.9 (L) 03/28/2021    AST 23 03/28/2021    ALK 46 03/28/2021    ALT 39 03/28/2021    GLU 126 (H) 03/28/2021     Lab Results   Component Value Date    INR 0.95 03/27/2021    PTT 28.5 03/27/2021       Lab Results   Component Value Date    BNP 21 03/27/2021     Lab Results   Component Value Date    A1C 7.2 (H) 03/27/2021     Lab Results   Component Value Date    CHOL 164 03/27/2021    LDLCALC 85 03/27/2021    TRIG 158 (H) 03/27/2021       Other Diagnostics  Coronary angiogram:  [facility] on 03/27/2021:  CONCLUSIONS:  1. Mid LAD with 50% stenosis. RFR of mid LAD was 0.95. This was physiologically non-significant.  2.  LVEDP 20-25 mm Hg.    RECOMMENDATIONS:  1. Maximize medical therapy.  2. Aggressive risk factor modification efforts.  3. Monitor vitals and arterial access site/pulses.    Echocardiogram 03/27/21:   1. Mildly decreased left ventricular systolic function. The left ventricular ejection fraction is 51% by Biplane.   2. Left ventricular ejection fraction is 53% by 3D.   3. Mild concentric left ventricular hypertrophy.   4. Normal LV filling pressures.   5. Normal right ventricular size and systolic function.    Exercise stress echocardiogram 03/27/21:   1.  Positive exercise stress test for ischemia.   2. Patient developed chest pain at peak excercise that improved with recovery and rest.   3. Stage 1: 1: Mid and apical inferior wall is abnormal.      Stage 2: 2: Apical inferior segment is abnormal.   4. The peak estimated pulmonary artery systolic pressure during exercise was indeterminate.   5. Average functional capacity.    Assessment and plan:   Gabriela Giannelli is a 60 year old male    #Hypertension  -still above goal on home values  -Losartan 100 mg daily, Metoprolol 50 mg XL, amlodipine 10 mg daily, HCTZ 25 mg daily clonidine 0.1 mg daily, hydralazine 100 mg TID  -I spoke with patient and daughter who is a Software engineer.  Given complex medical regimen, I want to make sure he is adherent to this. Instructed patient to bring in his medications to next visit   start spironolactone 25 mg daily.  BMP in one week  -Resistant hypertension  -Prior secondary hypertension work-up negative for hyperaldosteronism and pheochromocytoma.  Normal creatinine.  His left arm blood pressure was actually higher than his right arm blood pressure which does not suggest coarctation of the aorta.  Nonetheless, I will order transthoracic echocardiogram as below.  -Bilateral renal artery stenosis unlikely with normal creatinine on ARB, checked renal dopplers, report pending  -Referred to sleep medicine for assessment of OSA, appreciate recs    #Coronary artery disease  -Moderate non obstructive disease found on coronary angiogram  -Aspirin 81 mg daily, metoprolol 50 mg XL, atorvastatin 80 mg daily  -He had abnormal exercise stress echocardiogram and was referred for coronary angiogram  -Consider microvascular disease?  Control blood pressure as above.  Add imdur 30 mg daily for antianginal effect    #Hyperlipidemia  -Goal LDL<70 mg/dL given CAD  -FLP 03/27/21 - TC 164, HDL 47, LDL 85, TG 158  -Repeat FLP prior to next appt    #Valve issue?  -Unclear issue with the valve.  TTE shows no significant  valvular heart disease.  He has TTE in our system from 2016 showing aortic sclerosis with no other signficant valve disease    RTC next available to review blood pressure    I spent greater than 40 minutes on the day of the encounter reviewing the patient's diagnostic tests, performing a history and physical exam, writing orders, educating the patient, coordinating patient care with the staff, and documenting in the electronic medical records system.             ICD-10-CM ICD-9-CM    1. Essential hypertension, malignant  I10 401.0    2. Routine health maintenance  Z00.00 V70.0    3. Chest pain, unspecified type  R07.9 786.50    4. Aortic valve disorder  I35.9 424.1    5. Coronary artery disease involving native coronary artery of native heart without angina pectoris  I25.10 414.01 Lipid Panel  Green Plasma Separator Tube        There are no Patient Instructions on file for this visit.    Paul Half, MD  Health Sciences Assistant Clinical Professor  Division of Cardiology  Department of [place]

## 2021-04-16 ENCOUNTER — Ambulatory Visit
Admission: RE | Admit: 2021-04-16 | Discharge: 2021-04-16 | Disposition: A | Discharge status: Home / Self Care | Payer: BLUE CROSS/BLUE SHIELD | Attending: Cardiovascular Disease | Admitting: Cardiovascular Disease

## 2021-04-16 DIAGNOSIS — I10 Essential (primary) hypertension: Secondary | ICD-10-CM | POA: Insufficient documentation

## 2021-04-17 ENCOUNTER — Ambulatory Visit: Payer: BLUE CROSS/BLUE SHIELD | Attending: Cardiovascular Disease | Admitting: Cardiovascular Disease

## 2021-04-17 ENCOUNTER — Ambulatory Visit (INDEPENDENT_AMBULATORY_CARE_PROVIDER_SITE_OTHER): Payer: BLUE CROSS/BLUE SHIELD | Admitting: Psychiatry

## 2021-04-17 ENCOUNTER — Encounter: Payer: Self-pay | Admitting: Psychiatry

## 2021-04-17 ENCOUNTER — Encounter: Payer: Self-pay | Admitting: Cardiovascular Disease

## 2021-04-17 ENCOUNTER — Other Ambulatory Visit
Admission: RE | Admit: 2021-04-17 | Discharge: 2021-04-17 | Disposition: A | Discharge status: Home Health | Payer: BLUE CROSS/BLUE SHIELD

## 2021-04-17 VITALS — BP 171/106 | HR 74 | Resp 16 | Ht 67.0 in | Wt 173.8 lb

## 2021-04-17 VITALS — BP 188/110 | HR 73 | Ht 66.93 in | Wt 173.0 lb

## 2021-04-17 DIAGNOSIS — I10 Essential (primary) hypertension: Secondary | ICD-10-CM

## 2021-04-17 DIAGNOSIS — I2510 Atherosclerotic heart disease of native coronary artery without angina pectoris: Secondary | ICD-10-CM

## 2021-04-17 DIAGNOSIS — R0681 Apnea, not elsewhere classified: Secondary | ICD-10-CM

## 2021-04-17 DIAGNOSIS — G4733 Obstructive sleep apnea (adult) (pediatric): Secondary | ICD-10-CM

## 2021-04-17 DIAGNOSIS — G4759 Other parasomnia: Secondary | ICD-10-CM

## 2021-04-17 DIAGNOSIS — I359 Nonrheumatic aortic valve disorder, unspecified: Secondary | ICD-10-CM | POA: Insufficient documentation

## 2021-04-17 DIAGNOSIS — G4761 Periodic limb movement disorder: Secondary | ICD-10-CM

## 2021-04-17 DIAGNOSIS — Z6827 Body mass index (BMI) 27.0-27.9, adult: Secondary | ICD-10-CM | POA: Insufficient documentation

## 2021-04-17 DIAGNOSIS — Z0000 Encounter for general adult medical examination without abnormal findings: Secondary | ICD-10-CM | POA: Insufficient documentation

## 2021-04-17 DIAGNOSIS — R351 Nocturia: Secondary | ICD-10-CM

## 2021-04-17 DIAGNOSIS — G4719 Other hypersomnia: Secondary | ICD-10-CM

## 2021-04-17 DIAGNOSIS — G2581 Restless legs syndrome: Secondary | ICD-10-CM

## 2021-04-17 DIAGNOSIS — R0683 Snoring: Secondary | ICD-10-CM

## 2021-04-17 DIAGNOSIS — G478 Other sleep disorders: Secondary | ICD-10-CM

## 2021-04-17 DIAGNOSIS — R079 Chest pain, unspecified: Secondary | ICD-10-CM | POA: Insufficient documentation

## 2021-04-17 DIAGNOSIS — G4752 REM sleep behavior disorder: Secondary | ICD-10-CM

## 2021-04-17 DIAGNOSIS — F515 Nightmare disorder: Secondary | ICD-10-CM

## 2021-04-17 LAB — BASIC METABOLIC PANEL, BLOOD
BUN: 19 mg/dL (ref 7–25)
CO2: 25 mmol/L (ref 21–31)
Calcium: 8.8 mg/dL (ref 8.6–10.3)
Chloride: 104 mmol/L (ref 98–107)
Creat: 1.4 mg/dL — ABNORMAL HIGH (ref 0.7–1.3)
Electrolyte Balance: 8 mmol/L (ref 2–12)
Glucose: 130 mg/dL — ABNORMAL HIGH (ref 85–125)
Potassium: 3.4 mmol/L — ABNORMAL LOW (ref 3.5–5.1)
Sodium: 137 mmol/L (ref 136–145)
eGFR - high estimate: >60 (ref >59)
eGFR - low estimate: 52 — ABNORMAL LOW (ref >59)

## 2021-04-17 LAB — LIPID(CHOL FRACT) PANEL, BLOOD
Cholesterol: 167 MG/DL (ref <200)
HDL Cholesterol: 47 MG/DL (ref >40)
LDL Cholesterol (calc): 93 MG/DL (ref <160)
Non HDL Cholesterol (calculated): 120 MG/DL (ref <130)
Triglycerides: 137 MG/DL (ref <150)
VLDL Cholesterol (calculated): 27 MG/DL

## 2021-04-17 LAB — IRON/IBC PANEL
% Saturation: 29 % (ref 20–55)
Ferritin: 149 NG/ML (ref 23–233)
Iron: 80 ug/dL (ref 49–181)
TIBC: 272 ug/dL — ABNORMAL LOW (ref 284–507)
Transferrin: 194 MG/DL — ABNORMAL LOW (ref 203–362)

## 2021-04-17 MED ORDER — LOSARTAN POTASSIUM 100 MG OR TABS
100.0000 mg | ORAL_TABLET | Freq: Every day | ORAL | 3 refills | Status: DC
Start: 2021-04-17 — End: 2021-06-19

## 2021-04-17 MED ORDER — HYDROCHLOROTHIAZIDE 25 MG OR TABS
25.0000 mg | ORAL_TABLET | Freq: Every day | ORAL | 3 refills | Status: DC
Start: 2021-04-17 — End: 2021-06-19

## 2021-04-17 MED ORDER — ISOSORBIDE MONONITRATE CR 30 MG OR TB24
30.0000 mg | ORAL_TABLET | Freq: Every morning | ORAL | 3 refills | Status: DC
Start: 2021-04-17 — End: 2021-06-19

## 2021-04-17 MED ORDER — SPIRONOLACTONE 25 MG OR TABS
25.0000 mg | ORAL_TABLET | Freq: Every day | ORAL | 3 refills | Status: DC
Start: 2021-04-17 — End: 2021-08-28

## 2021-04-17 MED ORDER — METOPROLOL SUCCINATE 50 MG OR TB24
50.0000 mg | ORAL_TABLET | Freq: Every day | ORAL | 3 refills | Status: DC
Start: 2021-04-17 — End: 2021-06-19

## 2021-04-17 MED ORDER — AMLODIPINE 10 MG OR TABS
10.0000 mg | ORAL_TABLET | Freq: Every day | ORAL | 3 refills | Status: DC
Start: 2021-04-17 — End: 2021-06-19

## 2021-04-17 NOTE — Progress Notes (Signed)
Physician Requesting The Consultation:  Paul Half, MD  [address]  [place],  [place] [address]    Chief complaint (Questions to Be Answered): snoring, apneas, sleepiness    History of Presenting Illness:  Keith Cross is being seen in the sleep clinic requested by Dr. Ronalee Red for further evaluation of the above symptoms noted in the chief complaint section.      a. Nature of sleep symptoms: severe sleep disturbances in the setting of untreated obstructive sleep apnea     He was remotely diagnosed with OSA more than a decade ago. He used a PAP therapy in the past as well as an MAD. Currently not treated for many years.   Of note, he has history of stroke as well as malignant hypertension.       b. Sleep schedule:  Bed time: 2300  Sleep latency: 60 min  Number of awakening: 3  Final awakening time: 0700  Estimate sleep time: 5 h  Nap: 0 per week    c. Sleeping Environment  Medication/food at bed time: no  Dark, quiet, cool bed room? yes  Sleep partner issue: snoring  Sleeping position: varies      d. Sleep Quality  Urge to move your legs? Yes to all four URGE. But RLSRS today is 8 today.  Snoring? yes  Gasping/snorting? yes  Witnessed apneas? yes  Headache/heartburn/pain? yes  Sleep hallucination? yes  Sleep paralysis? yes  Dream quality: nightmares, vivid dreasm  Sleep behavior (sleepwalking, sleep eating, sleep terror, acting out dream): leg movements, arm swinging, acting out dream  Nocturia? yes    e. Daytime Sleepiness and Sleep Related Problems  Regular daytime activity? (work schedule) works from 3-11 pm.   Excessive daytime sleepiness? yes  Caffeine use?  2-3 (latest time of caffeine use:7 pm)  Stimulant med? (name, dosing time, for what?) no  Sleep attacks (ever fallen asleep in potentially dangerous situations?)     no    f. comorbid conditions  Depression? yes  Anxiety? yes  T&A? No  UPPP? no  Supplemental oxygen? no  COPD/asthma? no  Head injury? no  Seizure? no    g. family sleep issue: a father with OSA and  RLS  Insomnia:  OSA:  Narcolepsy:  RLS:  Sleepwalking:          Past Medical History:   Diagnosis Date   . History of CVA (cerebrovascular accident)    . Hyperlipidemia    . Hypertension    . Type 2 diabetes mellitus (CMS-HCC)         MEDS:  Current Outpatient Medications on File Prior to Visit   Medication Sig Dispense Refill   . amLODIPINE (NORVASC) 10 MG tablet Take 1 tablet (10 mg) by mouth daily. 90 tablet 3   . aspirin 81 MG EC tablet Take 81 mg by mouth daily.     Marland Kitchen atorvastatin (LIPITOR) 80 MG tablet Take 1 tablet (80 mg) by mouth daily. 90 tablet 3   . cloNIDine (CATAPRES) 0.1 MG tablet      . colchicine 0.6 MG tablet Take 0.6 mg by mouth 2 times daily.     . famotidine (PEPCID) 40 MG tablet Take 40 mg by mouth daily.     . furosemide (LASIX) 20 MG tablet      . hydrALAZINE (APRESOLINE) 100 MG tablet Take 100 mg by mouth 3 times daily.     . hydrochlorothiazide (HYDRODIURIL) 25 MG tablet Take 1 tablet (25 mg) by mouth daily.  90 tablet 3   . HYDROcodone-acetaminophen (NORCO) 10-325 MG tablet Take 1 tablet by mouth every 6 hours as needed.     . [DISCONTINUED] isosorbide mononitrate (IMDUR) 30 MG Controlled-Release tablet Take 30 mg by mouth every morning.     Marland Kitchen losartan (COZAAR) 100 MG tablet Take 100 mg by mouth daily.     Marland Kitchen losartan (COZAAR) 25 MG tablet Take 25 mg by mouth daily.     . metFORMIN (GLUCOPHAGE XR) 500 mg XR tablet Take 1 tablet (500 mg) by mouth 2 times daily. 60 tablet 0   . metFORMIN (GLUCOPHAGE) 850 MG tablet      . metoprolol succinate (TOPROL XL) 50 MG XL tablet Take 1 tablet (50 mg) by mouth daily. 90 tablet 3   . naloxone (NARCAN) 4 mg/0.1 mL nasal spray      . nitroGLYcerin (NITROSTAT) 0.4 MG SL tablet 1 tablet (0.4 mg) by Sublingual route every 5 minutes as needed for Chest Pain. up to 3 tabs per episode. 1 each 0   . nortriptyline (PAMELOR) 10 MG capsule Take 1 capsule (10 mg) by mouth nightly. 30 capsule 6   . ondansetron (ZOFRAN) 8 MG tablet TAKE 1 TABLET BY MOUTH THREE TIMES  DAILY AS NEEDED     . [DISCONTINUED] pioglitazone (ACTOS) 30 mg tablet Take 30 mg by mouth daily.     Marland Kitchen spironolactone (ALDACTONE) 50 MG tablet Take 50 mg by mouth daily.     . TRUEplus Lancets 30G MISC 2 times daily.     . VELPHORO 500 MG CHEW CHEW AND SWALLOW 2 TABLETS THREE TIMES DAILY WITH MEALS       Current Facility-Administered Medications on File Prior to Visit   Medication Dose Route Frequency Provider Last Rate Last Admin   . nitroGLYcerin (NITROLINGUAL) spray 1 spray  1 spray Sublingual Q5 Min PRN Paul Half, MD   3 spray at 03/27/21 1439     ALL:  Patient has no known allergies.    Family History:  No family history on file.    Social History     Socioeconomic History   . Marital status: Married   Tobacco Use   . Smoking status: Never Smoker   . Smokeless tobacco: Never Used         Review of Systems:  10/14 systems reviewed, all negative except where mentioned in HPI or below:  Review of Systems       Physical Examination  Vitals:    04/17/21 1041 04/17/21 1047 04/17/21 1049   BP: (!) 194/119 (!) 180/110 (!) 188/110   BP Location: Left arm Right arm Left arm   BP Patient Position: Sitting Standing Standing   BP cuff size: Regular Regular Regular   Pulse: 73 73    SpO2: 96%     Weight: 78.5 kg (173 lb)     Height: 5' 6.93" (1.7 m)     Estimated body mass index is 27.15 kg/m as calculated from the following:    Height as of this encounter: 5' 6.93" (1.7 m).    Weight as of this encounter: 78.5 kg (173 lb).    General:  Alert, cooperative, no distress, appears stated age.  Head:  Normocephalic, without obvious abnormality, atraumatic.  Eyes:  Conjunctivae/corneas clear.   Nose: Nares normal.    Throat:  Mallampati: 4.  Neck: Supple, symmetrical, trachea midline, neck circumference was 15.5 inches.  Lungs:  Clear to auscultation bilaterally.  Heart:  Regular rate and rhythm, S1, S2  normal, no murmur, click, rub or gallop.   Extremities:  Extremities normal, atraumatic, no cyanosis or edema.  Pulses:  2+  and symmetric all extremities.  Neuropsychiatric exams   Mental status:  Speech and language were normal.  Attention span and concentration were normal.   Cranial nerves:  CN II:   Visual fields were full to confrontation.  Pupils (R/L)  Equal and symmetrically reactive. CN III, IV, VI: Extraocular motility was intact. CN V: Facial sensation was intact. CN VII: Muscles of facial expression were strong and symmetric.  CN VIII:  Hearing was normal bilaterally.  CN IX, X: Palate elevates symmetrically.  CN XI: Sternocleidomastoid and trapezius normal. CN XII: The tongue protrudes midline without atrophy or fasciculations.      Results/Records Reviewed: PSG ordered.     Impression:  Milburn Freeney is a 60 year old male presenting with symptoms and signs of many sleep disorders as listed below.   We will need a PSG to assess for these many sleep disorders.     Problem List    ICD-10-CM ICD-9-CM    1. Snoring  R06.83 786.09    2. Essential hypertension, malignant  I10 401.0    3. OSA (obstructive sleep apnea)  G47.33 327.23 Overnight Polysomnography   4. Nightmares  F51.5 307.47    5. Nocturia  R35.1 788.43    6. Excessive daytime sleepiness  G47.19 780.54    7. Witnessed apneic spells  R06.81 786.03    8. Sleep-related hallucinations  G47.59 307.47    9. Sleep paralysis  G47.8 780.58    10. Dream enactment behavior  G47.52 327.42 Overnight Polysomnography   11. RLS (restless legs syndrome)  G25.81 333.94    12. Iron metabolism disease  E83.10 275.09 Iron/IBC Panel   13. Periodic limb movements of sleep  G47.61 327.51 Overnight Polysomnography       Plan:  - for RLS, we will check iron panel.  - given acting out dream, please ensure a safe sleep environment.   - sleep study to assess for obstructive sleep apnea and sleep related movements and behavior disorders.   - follow up with me after the sleep study.     Staff Attestation  I have personally provided 60 minutes of clinical care time. Time includes review of sleep  questionnaire, clarification of symptoms, discussion of differential diagnosis, necessary work-ups, contingency plans, communication to arrange next steps and documentation of the above.         Staff Involved: Attending Physician Only  Raeford Razor, MD, MS  Attending Physician  Sleep Medicine

## 2021-04-17 NOTE — Patient Instructions (Addendum)
Medications I believe you are taking  Losartan 100 mg daily  Metoprolol XL 50 mg daily  Amlodipine 10 mg daily  Hydrochlorothiazide 25 mg daily    Clonidine 0.1 mg daily  Hydralazine 100 mg three times daily    Medications I am starting  Spironolactone 25 mg daily  Isosorbide mononitrate 30 mg daily    Please let me know if this medication list is incorrect    Please bring in your medications next visit

## 2021-04-17 NOTE — Patient Instructions (Signed)
-   for RLS, we will check iron panel.  - given acting out dream, please ensure a safe sleep environment.   - sleep study to assess for obstructive sleep apnea and sleep related movements and behavior disorders.   - follow up with me after the sleep study.

## 2021-04-18 ENCOUNTER — Encounter: Payer: Self-pay | Admitting: Cardiovascular Disease

## 2021-04-18 DIAGNOSIS — I119 Hypertensive heart disease without heart failure: Secondary | ICD-10-CM

## 2021-05-14 LAB — ECG IN CLINIC
P AXIS: 65 Deg
PR INTERVAL: 141 ms
QRS INTERVAL/DURATION: 107 ms
QT: 343 ms
R AXIS: -41 Deg
R-R INTERVAL AVERAGE: 643 ms
T AXIS: 26 Deg

## 2021-05-30 ENCOUNTER — Ambulatory Visit
Admission: RE | Admit: 2021-05-30 | Discharge: 2021-05-30 | Disposition: A | Discharge status: Home / Self Care | Payer: BLUE CROSS/BLUE SHIELD | Attending: Diagnostic Radiology | Admitting: Diagnostic Radiology

## 2021-05-30 ENCOUNTER — Ambulatory Visit
Admit: 2021-05-30 | Discharge: 2021-05-30 | Disposition: A | Discharge status: Home Health | Payer: BLUE CROSS/BLUE SHIELD

## 2021-05-30 DIAGNOSIS — J328 Other chronic sinusitis: Secondary | ICD-10-CM | POA: Insufficient documentation

## 2021-05-30 DIAGNOSIS — R519 Headache, unspecified: Secondary | ICD-10-CM

## 2021-05-30 DIAGNOSIS — R930 Abnormal findings on diagnostic imaging of skull and head, not elsewhere classified: Secondary | ICD-10-CM

## 2021-05-31 NOTE — Progress Notes (Signed)
 Artis Flock, Nurse Practitioner  [facility]   [address] [place], [place] [address]  P: [phone] F: [phone]    Date of Evaluation: 06/04/2021  Referring Physician: Self, Referred  Primary Care Physician: Hoover Brunette Huu    Chief Complaint: heada

## 2021-06-04 ENCOUNTER — Ambulatory Visit: Payer: BLUE CROSS/BLUE SHIELD | Attending: Neurology | Admitting: Nurse Practitioner

## 2021-06-04 ENCOUNTER — Encounter: Payer: Self-pay | Admitting: Nurse Practitioner

## 2021-06-04 VITALS — BP 137/88 | HR 74 | Temp 98.5°F | Resp 20 | Ht 67.0 in | Wt 171.5 lb

## 2021-06-04 DIAGNOSIS — D1800 Hemangioma unspecified site: Secondary | ICD-10-CM | POA: Insufficient documentation

## 2021-06-04 DIAGNOSIS — G43009 Migraine without aura, not intractable, without status migrainosus: Secondary | ICD-10-CM

## 2021-06-04 DIAGNOSIS — Z8673 Personal history of transient ischemic attack (TIA), and cerebral infarction without residual deficits: Secondary | ICD-10-CM

## 2021-06-04 DIAGNOSIS — G4733 Obstructive sleep apnea (adult) (pediatric): Secondary | ICD-10-CM | POA: Insufficient documentation

## 2021-06-04 DIAGNOSIS — Z6826 Body mass index (BMI) 26.0-26.9, adult: Secondary | ICD-10-CM

## 2021-06-04 DIAGNOSIS — R519 Headache, unspecified: Secondary | ICD-10-CM | POA: Insufficient documentation

## 2021-06-04 MED ORDER — VENLAFAXINE HCL 37.5 MG OR TABS
37.5000 mg | ORAL_TABLET | Freq: Two times a day (BID) | ORAL | 4 refills | Status: DC
Start: 2021-06-04 — End: 2022-05-27

## 2021-06-04 NOTE — Patient Instructions (Addendum)
Follow up as scheduled with Sleep Medicine  Follow up as scheduled with Cardiology  Refer to Neurosurgery-request images MRI brain from 11/29/2015 from [facility]  Stop nortriptyline and start venlafaxine 37.'5mg'$  1 tablet twice a day  Follow up 3 months

## 2021-06-07 ENCOUNTER — Ambulatory Visit (INDEPENDENT_AMBULATORY_CARE_PROVIDER_SITE_OTHER): Payer: BLUE CROSS/BLUE SHIELD | Admitting: Psychiatry

## 2021-06-07 VITALS — BP 135/85 | HR 61 | Temp 97.6°F | Resp 10 | Ht 67.0 in | Wt 171.5 lb

## 2021-06-07 DIAGNOSIS — G4733 Obstructive sleep apnea (adult) (pediatric): Secondary | ICD-10-CM

## 2021-06-07 DIAGNOSIS — G4761 Periodic limb movement disorder: Secondary | ICD-10-CM

## 2021-06-07 DIAGNOSIS — G4752 REM sleep behavior disorder: Secondary | ICD-10-CM

## 2021-06-08 NOTE — Progress Notes (Signed)
 TECH NOTE    VITALS  Blood pressure (BP): 142/90  Temperature: 98.1 F (36.7 C)  Temperature source: Temporal  Heart Rate: 63  Respirations: 18  Height: 5\' 7"  (170.2 cm)  Weight: 77.8 kg (171 lb 8.3 oz)  BMI (Calculated): 26.9  SpO2: 98 %  Neck circumfere

## 2021-06-17 NOTE — Progress Notes (Signed)
 [facility] Note    Patient: Keith Cross  Patient DOB: 06-24-1961  Patient ID: [id number]  Patient PCP: Suzan Slick  Date of Service: 06/19/2021    Dr. Suzan Slick, as you know,    Reason for Visit  Follow Up       HPI  [name] is

## 2021-06-19 ENCOUNTER — Encounter: Payer: Self-pay | Admitting: Cardiovascular Disease

## 2021-06-19 ENCOUNTER — Other Ambulatory Visit
Admission: RE | Admit: 2021-06-19 | Discharge: 2021-06-19 | Disposition: A | Discharge status: Home Health | Payer: BLUE CROSS/BLUE SHIELD

## 2021-06-19 ENCOUNTER — Ambulatory Visit: Payer: BLUE CROSS/BLUE SHIELD | Attending: Cardiovascular Disease | Admitting: Cardiovascular Disease

## 2021-06-19 VITALS — BP 87/53 | HR 80 | Resp 18 | Wt 169.4 lb

## 2021-06-19 DIAGNOSIS — I119 Hypertensive heart disease without heart failure: Secondary | ICD-10-CM | POA: Insufficient documentation

## 2021-06-19 DIAGNOSIS — Z6826 Body mass index (BMI) 26.0-26.9, adult: Secondary | ICD-10-CM

## 2021-06-19 DIAGNOSIS — I2510 Atherosclerotic heart disease of native coronary artery without angina pectoris: Secondary | ICD-10-CM | POA: Insufficient documentation

## 2021-06-19 DIAGNOSIS — R079 Chest pain, unspecified: Secondary | ICD-10-CM

## 2021-06-19 DIAGNOSIS — I359 Nonrheumatic aortic valve disorder, unspecified: Secondary | ICD-10-CM | POA: Insufficient documentation

## 2021-06-19 LAB — BASIC METABOLIC PANEL, BLOOD
BUN: 20 mg/dL (ref 7–25)
CO2: 25 mmol/L (ref 21–31)
Calcium: 9.8 mg/dL (ref 8.6–10.3)
Chloride: 105 mmol/L (ref 98–107)
Creat: 1.4 mg/dL — ABNORMAL HIGH (ref 0.7–1.3)
Electrolyte Balance: 9 mmol/L (ref 2–12)
Glucose: 107 mg/dL (ref 85–125)
Potassium: 4.1 mmol/L (ref 3.5–5.1)
Sodium: 139 mmol/L (ref 136–145)
eGFR - high estimate: >60 (ref >59)
eGFR - low estimate: 52 — ABNORMAL LOW (ref >59)

## 2021-06-19 LAB — LIPID(CHOL FRACT) PANEL, BLOOD
Cholesterol: 97 MG/DL (ref <200)
HDL Cholesterol: 44 MG/DL (ref >40)
LDL Cholesterol (calc): 33 MG/DL (ref <160)
Non HDL Cholesterol (calculated): 53 MG/DL (ref <130)
Triglycerides: 98 MG/DL (ref <150)
VLDL Cholesterol (calculated): 20 MG/DL

## 2021-06-19 MED ORDER — ATORVASTATIN CALCIUM 80 MG OR TABS
80.0000 mg | ORAL_TABLET | Freq: Every day | ORAL | 3 refills | Status: DC
Start: 2021-06-19 — End: 2023-09-02

## 2021-06-19 MED ORDER — ALLOPURINOL 300 MG OR TABS
300.0000 mg | ORAL_TABLET | Freq: Every day | ORAL | Status: DC
Start: ? — End: 2022-01-13

## 2021-06-19 MED ORDER — METOPROLOL SUCCINATE 50 MG OR TB24
50.0000 mg | ORAL_TABLET | Freq: Every day | ORAL | 3 refills | Status: DC
Start: 2021-06-19 — End: 2021-08-28

## 2021-06-19 MED ORDER — AMLODIPINE 10 MG OR TABS
10.0000 mg | ORAL_TABLET | Freq: Every day | ORAL | 3 refills | Status: DC
Start: 2021-06-19 — End: 2022-07-22

## 2021-06-19 MED ORDER — NITROGLYCERIN 0.4 MG SL SUBL
0.4000 mg | SUBLINGUAL_TABLET | SUBLINGUAL | 0 refills | Status: DC | PRN
Start: 2021-06-19 — End: 2022-01-13

## 2021-06-19 MED ORDER — ISOSORBIDE MONONITRATE CR 30 MG OR TB24
30.0000 mg | ORAL_TABLET | Freq: Every morning | ORAL | 3 refills | Status: DC
Start: 2021-06-19 — End: 2022-05-02

## 2021-06-19 MED ORDER — ASPIRIN 81 MG OR TBEC
81.0000 mg | DELAYED_RELEASE_TABLET | Freq: Every day | ORAL | 3 refills | Status: AC
Start: 2021-06-19 — End: ?

## 2021-06-19 MED ORDER — HYDROCHLOROTHIAZIDE 25 MG OR TABS
25.0000 mg | ORAL_TABLET | Freq: Every day | ORAL | 3 refills | Status: DC
Start: 2021-06-19 — End: 2021-08-28

## 2021-06-19 MED ORDER — LOSARTAN POTASSIUM 100 MG OR TABS
100.0000 mg | ORAL_TABLET | Freq: Every day | ORAL | 3 refills | Status: DC
Start: 2021-06-19 — End: 2021-08-28

## 2021-06-19 NOTE — Patient Instructions (Addendum)
Please stop taking your spironolactone 25 mg daily

## 2021-06-21 ENCOUNTER — Encounter: Payer: Self-pay | Admitting: Cardiovascular Disease

## 2021-06-28 ENCOUNTER — Ambulatory Visit (INDEPENDENT_AMBULATORY_CARE_PROVIDER_SITE_OTHER): Payer: BLUE CROSS/BLUE SHIELD | Admitting: Psychiatry

## 2021-06-28 VITALS — BP 154/98 | HR 65 | Ht 67.0 in | Wt 169.5 lb

## 2021-06-28 DIAGNOSIS — G4733 Obstructive sleep apnea (adult) (pediatric): Secondary | ICD-10-CM

## 2021-06-28 DIAGNOSIS — Z7189 Other specified counseling: Secondary | ICD-10-CM

## 2021-06-28 NOTE — Progress Notes (Signed)
 Physician Requesting The Consultation:  [name], MD  [address]  [place],  [place] [address]    ID:  Keith Cross is a 60 year old male. [name] returns to clinic for follow up evaluation of  Obstructive sleep apnea    Subjective:   He had an overnigh

## 2021-06-28 NOTE — Patient Instructions (Signed)
[place].  Phone: [phone]  Fax: [phone]

## 2021-07-01 NOTE — Progress Notes (Signed)
 Staff involved in this encounter:   Resident physician: Nicholes Mango, MD and   Attending physician, Earma Reading, MD, MS       Attending attestation  I personally obtained the history and performed the exam along with the resident. I personally reviewed the

## 2021-07-04 ENCOUNTER — Ambulatory Visit: Payer: BLUE CROSS/BLUE SHIELD | Attending: Neurosurgery | Admitting: Neurosurgery

## 2021-07-04 ENCOUNTER — Encounter: Payer: Self-pay | Admitting: Neurosurgery

## 2021-07-04 VITALS — BP 169/109 | HR 72 | Temp 97.8°F | Resp 14 | Ht 67.0 in | Wt 169.7 lb

## 2021-07-04 DIAGNOSIS — Q283 Other malformations of cerebral vessels: Secondary | ICD-10-CM | POA: Insufficient documentation

## 2021-07-04 DIAGNOSIS — Z6826 Body mass index (BMI) 26.0-26.9, adult: Secondary | ICD-10-CM | POA: Insufficient documentation

## 2021-07-04 NOTE — Progress Notes (Signed)
 Vietnamese interpreter     Vara Guardian, MD  [facility]  Department of Neurological Surgery  [address] [place] [address]  [place], [place] [address]   [email or web address]  New Referrals: [phone]

## 2021-07-11 ENCOUNTER — Ambulatory Visit: Payer: BLUE CROSS/BLUE SHIELD | Admitting: Nurse Practitioner

## 2021-07-26 ENCOUNTER — Ambulatory Visit: Payer: BLUE CROSS/BLUE SHIELD | Admitting: Nurse Practitioner

## 2021-08-26 NOTE — Progress Notes (Signed)
 [facility] Cardiology Clinic Note    Patient: Keith Cross  Patient DOB: [birth year]  Patient ID: [id number]  Patient PCP: Suzan Slick  Date of Service: 08/28/2021    Reason for Visit  Follow Up       HPI  Keith Cross is a 60 year old male    Interval Histor

## 2021-08-28 ENCOUNTER — Encounter: Payer: Self-pay | Admitting: Cardiovascular Disease

## 2021-08-28 ENCOUNTER — Ambulatory Visit: Payer: BLUE CROSS/BLUE SHIELD | Attending: Cardiovascular Disease | Admitting: Cardiovascular Disease

## 2021-08-28 VITALS — BP 138/78 | HR 101 | Ht 67.0 in | Wt 170.7 lb

## 2021-08-28 DIAGNOSIS — I10 Essential (primary) hypertension: Secondary | ICD-10-CM

## 2021-08-28 DIAGNOSIS — I119 Hypertensive heart disease without heart failure: Secondary | ICD-10-CM | POA: Insufficient documentation

## 2021-08-28 DIAGNOSIS — Z6826 Body mass index (BMI) 26.0-26.9, adult: Secondary | ICD-10-CM | POA: Insufficient documentation

## 2021-08-28 DIAGNOSIS — I359 Nonrheumatic aortic valve disorder, unspecified: Secondary | ICD-10-CM | POA: Insufficient documentation

## 2021-08-28 DIAGNOSIS — I2510 Atherosclerotic heart disease of native coronary artery without angina pectoris: Secondary | ICD-10-CM | POA: Insufficient documentation

## 2021-08-28 DIAGNOSIS — R079 Chest pain, unspecified: Secondary | ICD-10-CM | POA: Insufficient documentation

## 2021-08-28 MED ORDER — FUROSEMIDE 40 MG OR TABS
40.0000 mg | ORAL_TABLET | Freq: Every day | ORAL | Status: DC
Start: ? — End: 2021-08-28

## 2021-08-28 MED ORDER — HYDROCHLOROTHIAZIDE 25 MG OR TABS
25.0000 mg | ORAL_TABLET | Freq: Every day | ORAL | 3 refills | Status: DC
Start: 2021-08-28 — End: 2022-07-22

## 2021-08-28 MED ORDER — METOPROLOL SUCCINATE 100 MG OR TB24
100.0000 mg | ORAL_TABLET | Freq: Every day | ORAL | 3 refills | Status: DC
Start: 2021-08-28 — End: 2022-03-19

## 2021-08-28 NOTE — Patient Instructions (Addendum)
Please discontinue your hydrochlorothiazide and lasix  I will increase your metoprolol from 50 to 100 mg daily

## 2021-09-04 ENCOUNTER — Telehealth: Payer: Self-pay | Admitting: Neurosurgery

## 2021-09-04 NOTE — Telephone Encounter (Signed)
S/w pts daughter & informed her that MD only has AM clinic. She verbalized understanding and will let her father know.

## 2021-09-04 NOTE — Telephone Encounter (Signed)
 Patient's daughter called requesting to reschedule appointment scheduled on 09/26/21 in am to an afternoon appointment due to work conflict.     Offered to schedule patient in [place], patient's daughter declined stating they want to go to the [place] office.    Please assist, thank you.

## 2021-09-09 ENCOUNTER — Ambulatory Visit: Payer: BLUE CROSS/BLUE SHIELD | Admitting: Nurse Practitioner

## 2021-09-11 ENCOUNTER — Telehealth: Payer: Self-pay | Admitting: Nurse Practitioner

## 2021-09-11 NOTE — Telephone Encounter (Signed)
Patient is requesting a call back concerning patient appointment for tomorrow what he needs to bring. Please call back thank you

## 2021-09-12 ENCOUNTER — Encounter: Payer: Self-pay | Admitting: Nurse Practitioner

## 2021-09-12 ENCOUNTER — Other Ambulatory Visit: Payer: Self-pay

## 2021-09-12 ENCOUNTER — Ambulatory Visit: Payer: BLUE CROSS/BLUE SHIELD | Attending: Nurse Practitioner | Admitting: Nurse Practitioner

## 2021-09-12 VITALS — BP 156/82 | HR 69 | Temp 98.6°F | Resp 16 | Ht 67.0 in | Wt 173.3 lb

## 2021-09-12 DIAGNOSIS — G43009 Migraine without aura, not intractable, without status migrainosus: Secondary | ICD-10-CM | POA: Insufficient documentation

## 2021-09-12 DIAGNOSIS — G4733 Obstructive sleep apnea (adult) (pediatric): Secondary | ICD-10-CM | POA: Insufficient documentation

## 2021-09-12 DIAGNOSIS — D1800 Hemangioma unspecified site: Secondary | ICD-10-CM | POA: Insufficient documentation

## 2021-09-12 DIAGNOSIS — Z6827 Body mass index (BMI) 27.0-27.9, adult: Secondary | ICD-10-CM | POA: Insufficient documentation

## 2021-09-12 MED ORDER — MECLIZINE HCL 25 MG OR TABS
ORAL_TABLET | ORAL | Status: AC
Start: 2021-09-07 — End: ?

## 2021-09-12 MED ORDER — RIMEGEPANT SULFATE 75 MG PO TBDP
75.0000 mg | ORAL_TABLET | Freq: Every day | ORAL | 11 refills | Status: AC | PRN
Start: 2021-09-12 — End: ?
  Filled 2021-09-12: qty 16, 32d supply, fill #0
  Filled 2021-11-07 (×2): qty 16, 32d supply, fill #1

## 2021-09-12 NOTE — Patient Instructions (Addendum)
Follow up as scheduled with Sleep Medicine-gradually increase use of CPAP machine  Follow up as scheduled with Cardiology  Follow up as scheduled with neurosurgery  Taper off venlafaxine. Start rimegepant ODT 75mg  once daily as needed. Do not exceed 1 tablet per day.  You can register for temporary disability at the following website: [email or web address]  Follow up 4 months

## 2021-09-13 ENCOUNTER — Other Ambulatory Visit: Payer: PRIVATE HEALTH INSURANCE

## 2021-09-13 ENCOUNTER — Other Ambulatory Visit: Payer: Self-pay

## 2021-09-13 NOTE — Progress Notes (Signed)
Specialty Pharmacy Initial Prior Auth Note    Medication: NURTEC ODT  Medication sig: Take 1 tablet (75 mg) on or under the tongue daily as needed (to treat migraines). Do not take more than one tablet per day.  Medication qty: 16                                  Prescription receipt date: 09/12/21  Prescription insurance company: [employer]  TC#:[id number]  Prior authorization required?  No, processed w/ paid claim      [place] - Ext: 2     September 13, 2021 9:25 AM

## 2021-09-17 ENCOUNTER — Other Ambulatory Visit: Payer: Self-pay

## 2021-09-23 ENCOUNTER — Ambulatory Visit: Payer: BLUE CROSS/BLUE SHIELD | Admitting: Nurse Practitioner

## 2021-09-23 ENCOUNTER — Encounter: Payer: Self-pay | Admitting: Nurse Practitioner

## 2021-09-23 VITALS — BP 159/92 | HR 83 | Ht 67.0 in | Wt 176.6 lb

## 2021-09-23 DIAGNOSIS — G4733 Obstructive sleep apnea (adult) (pediatric): Secondary | ICD-10-CM

## 2021-09-23 DIAGNOSIS — Z7189 Other specified counseling: Secondary | ICD-10-CM

## 2021-09-23 NOTE — Progress Notes (Signed)
I had the pleasure of seeing Keith Cross at the [facility] for follow up of OSA on PAP therapy. He is a 60 year old male with PMH of cerebral cavernoma and OSA who has been undergoing management with PAP treatment. He was diagnosed with OSA on PSG on 06/07/21 with AHI 57.8/hr and O2 nadir 53%. This visit was conducted with [employer] [name] interpreter [place] (ID# [phone])    Interim History  He reports that when he first started using the CPAP, he reports it felt good but starting about 2 weeks ago he felt like water was being pushed into his nose. He is using the Resmed P30i nasal pillows. He has been getting woken up by water coming down from the tube/mask. He also feels that the air is very cold from the machine sometimes. He was not given a heated tube. He does tend to sleep with his mouth open and sometimes his mouth feels dry.     Sleep/wake routine was reviewed:   Usual bedtime: 10pm  Wake up time: 6am  Reported sleep latency: <15 min  Reported hours of sleep/night: 5-6 hours (without CPAP), 2 hours (with PAP)   Number of awakenings during the night/Duration: 1x/night for hours   Daytime napping: yes - daily   Use medications/supplements to sleep?: no    Epworth Sleepiness Score (0-24): Epworth Sleepiness Scale  Sitting and Reading: moderate  Watching TV: moderate  Sitting inactive in a public place (mtg, church, theater,etc): slight  As a passenger in a car for an hour without a break: slight  Lying down to rest in the afternoon when the circumstances permit: slight  Sitting and talking to someone: slight  Sitting quietly after lunch without alcohol: moderate  In a car, while stopped for a few minutes in traffic: moderate  Epworth Total Score: 12    FOSQ (0-20): FOSQ Score: 7.5      PAP Download 07/31/21 to 09/22/21  Type of PAP  Type of PAP:: Auto PAP (AIRSENSE 11 AUTOSET)  Pressure  PAP pressure: 5-15  DATE  Start Date: 07/31/21  Download period  Number of days: 54  Percentage greater than 4  hours  % > 4 Hrs: 24  Average number of hrs per night when using  Average hours: 4H 32M  AHI  Residual: 1.7  CPAP Compliance  Days Used: 41 Days  Total Days in Possession: 54 Days  Percentage of Days Used: 75.93 %  Leaks: 19 liters/hour        Review of Systems  10/14 systems reviewed, all negative except where mentioned in HPI or below:      Physical Exam  BP (!) 159/92 (BP Location: Left arm, BP Patient Position: Sitting, BP cuff size: Regular)   Pulse 83   Ht _0  (1.702 m)   Wt 80.1 kg (176 lb 9.4 oz)   SpO2 99%   BMI 27.66 kg/m    General/Constitutional: Well developed, well nourished male, who looks at stated age of 60 year old. No acute distress.   Neurological: Alert and Oriented x 3. Cranial nerves II through XII are grossly intact. Full range of motion throughout. Normal base and station.    Psychiatric: Normal mental status, mood is euthymic    Other Data Reviewed  06/07/21 PSG Results  1. SLEEP ARCHITECTURE: The total recording time of the diagnostic portion was 92.7 minutes. The total sleep time was 81.0 minutes resulting in a sleep efficiency of 87.4% (normal ? 85%). The patient  spent 11.0 minutes of total time in stage 1 sleep, 45.0 minutes in stage 2 sleep, 11.5 minutes in stage 3 sleep and 13.5 minutes in stage REM sleep. Latency to sleep onset was normal at 1.6 minutes with a normal latency to REM onset of 68.5 minutes.   The total recording time of the treatment portion was 388.5 minutes. The total sleep time was 380.0 minutes resulting in a sleep efficiency of 97.8% (normal ? 85%). The patient spent 7.5 minutes of total time in stage 1 sleep, 103.5 minutes in stage 2 sleep, 141.0 minutes in stage 3 sleep and 128.0 minutes in stage REM sleep. Latency to sleep onset was at 3.5 minutes with a latency to REM onset of 44.5 minutes.   2. AROUSALS: Wake after sleep onset (WASO) was 15.9 minutes. During diagnostic there were 78 arousals for an arousal index of 57.8 per hour. During treatment  section there were 45 arousals for an arousal index of 7.1 per hour.   3. EEG OBSERVATIONS: EEG did notshow evidence of obvious epileptiform activity using a limited montage, and there were no clinical events concerning for seizure.   4. RESPIRATORY EVENTS: During the diagnostic portion of the study, the patient experienced a total Obstructive Apnea and Hypopnea Index (oAHI) of 43.7 and a Central Apnea and Hypopnea Index (cAHI) of 11.1 for an overall Apnea Hypopnea Index (AHI) of 57.8 per hour. There were a total of 0 RERA events for a Respiratory Disturbance Index (RDI) of 57.8 per hour. The longest event was 106.0 seconds. Moderate snoring was observed throughout the study.     During the treatment portion of the study, the patient experienced a total Obstructive Apnea and Hypopnea Index (oAHI) 1.7 and a Central Apnea and Hypopnea Index (cAHI) of 0.9 for an overall Apnea Hypopnea Index (AHI) of 2.7 per hour. There were a total of 0 RERA events for a total Respiratory Disturbance Index (RDI) of 2.7 per hour. The longest event was 46.4 seconds. Moderate snoring was observed throughout the study.   5. OXIMETRY: Baseline oxygen saturation during wake at the time of device set up was 98%. The lowest oxygen saturation was 53.0%. Total number of desaturations scored ?4%: 73.   6. CARDIAC: ECG, with a limited montage, showed normal sinus rhythm. The overall average pulse rate was 56.4 beats per minute with an average pulse rate of 55.7 in NREM and 56.8 in REM. The maximum pulse rate was recorded at 90.0.   7. MOVEMENT: During diagnostic portion of the study there was 4 periodic limb movements (PLM) for a PLM index of 3.0 per hour (normal <15 per hour). There were 14 isolated limb movements with an index of 10.4 per hour. The total periodic limb movement arousal index was 1.5 per hour Frequent bruxism was observed thought the study.   During treatment portion of the study there was 43 periodic limb movements (PLM) for a  PLM index of 6.8 per hour (normal <15 per hour). There were 36 isolated limb movements with an index of 5.7 per hour. The total periodic limb movement arousal index was 0.9 per hour. Frequent bruxism was observed throughout the study.   8. CARBON DIOXIDE: The transcutaneous carbon dioxide was within normal limits at 37-43 mm Hg throughout the study.   9. TREATMENT POSITIVE AIRWAY PRESSURE (PAP): After 81.0 minutes of sleep Afshin met criteria for initiation of positive airway pressure. Patient was fit and titrated with nasal pillow ResMed/P30i/Med. CPAP was titrated from 4* cmH2O to a  maximum pressure 9* cmH2O.         Assessment  Carol Loftin is a 60 year old male with PMH of cerebral cavernoma who presents today for follow up of OSA currently on device therapy. He is compliant with PAP therapy and experiencing benefit from ongoing treatment.     #. Obstructive Sleep Apnea: baseline severe with AHI 57.8/hr and O2 nadir 53%. Patient is adherent to and benefiting from therapy as evidenced by residual AHI 1.7/hr on PAP and use 76% of days.     Plan  #. The following setting changes were made:   -Turned humidity down to level 4 from level 7   -Changed mask type from pillows to full face    #. Try switching to full face mask Dreamwear Full Face size medium-wide   #. Contact your DME to get a heated tube  #. The risks of untreated sleep apnea were reviewed and the functional and cardiovascular benefits of therapy were reviewed.   #. Remember to wear CPAP device at all times when sleeping.  #. Time was given to ask questions, all questions answered.   #. We went over device maintenance and the optional use of the humidifier and the available settings. We reviewed his interface fitting.   #. Follow up with NP in 1 months or sooner as needed for significant weight gain or new/worsening sleep issues. Contact clinic if any questions arise prior to the next visit.     Sincerely,  Harold Barban, NP      I have personally provided  40  minutes of clinical care time. Time includes review of therapy data, clarification of symptoms, troubleshooting issues, communication to arrange next steps and documentation of the above.

## 2021-09-23 NOTE — Patient Instructions (Addendum)
#.   Remember you can change the Humidity level under Settings in your machine if it ever feels like there is too much moisture     #. Try switching to the full face mask. Remember to do the final adjustments preferably in front of a mirror once the pressure has started flowing to ensure a good seal     #. Please contact your medical supplier and request a heated tube that was originally ordered with your prescription. Please see below for your medical equipment company contact information:     [employer].  Phone: [phone]

## 2021-09-25 ENCOUNTER — Telehealth: Payer: Self-pay | Admitting: Nurse Practitioner

## 2021-09-25 ENCOUNTER — Telehealth: Payer: Self-pay

## 2021-09-25 DIAGNOSIS — G4733 Obstructive sleep apnea (adult) (pediatric): Secondary | ICD-10-CM

## 2021-09-25 NOTE — Telephone Encounter (Signed)
 S/w pts daughter who stated that she did receive letter to schedule MRI but has not. She is aware to give them a call to schedule. I offered her an appt for 12/1 which was MD next available she declined. She stated that she will schedule MRI first then giv

## 2021-09-25 NOTE — Telephone Encounter (Signed)
Patients daughter called states she called DME and was told they haven't received RX for cpap and would like to know status on when it will be sent and why they haven't received at DME.

## 2021-09-26 ENCOUNTER — Ambulatory Visit: Payer: BLUE CROSS/BLUE SHIELD | Admitting: Neurosurgery

## 2021-09-26 NOTE — Telephone Encounter (Signed)
Called daughter back requesting clarification.   PT is already on pap , new pap is not  being prescribed at the moment.     If she was referring to a mask, Luanna Cole provided the patient with a full face mask the time of visit.     Thank you

## 2021-09-26 NOTE — Addendum Note (Signed)
Addended by: Serafina Mitchell on: 09/26/2021 02:23 PM     Modules accepted: Orders

## 2021-09-30 ENCOUNTER — Other Ambulatory Visit: Payer: Self-pay

## 2021-09-30 NOTE — Progress Notes (Signed)
First Fill Note    New  Medication: NURTEC ODT  Medication sig: Take 1 tablet (75 mg) on or under the tongue daily as needed (to treat migraines). Do not take more than one tablet per day.  Medication qty: 16        Scheduled pickup: 12/3  Spoke with: Patient  Copay: $0  Cost savings: $0                                 ---------------------------------------------------------------------------------  11/21: pt requested call back

## 2021-10-01 IMAGING — US US SOFT TISSUE PERINEUM
1 series · 14 of 22 positions shown · non-contrast
Comparison: none

[Series 1: us soft tissue perineum · 14 of 22 slices shown]
[im 1/22]
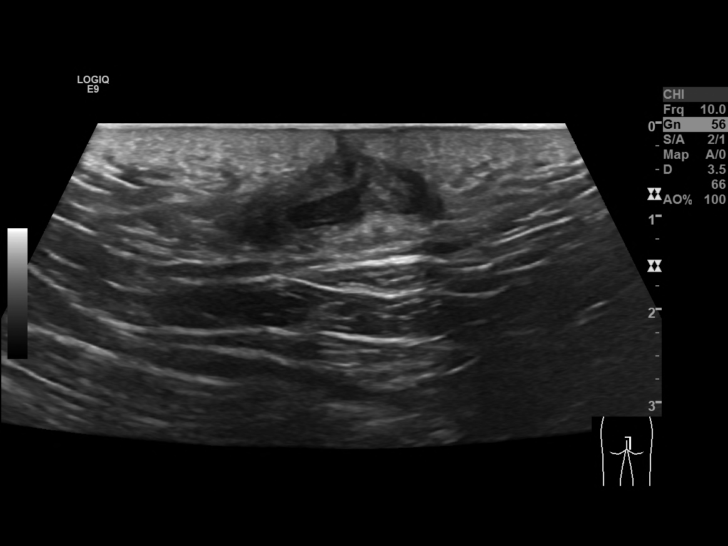
[im 3/22]
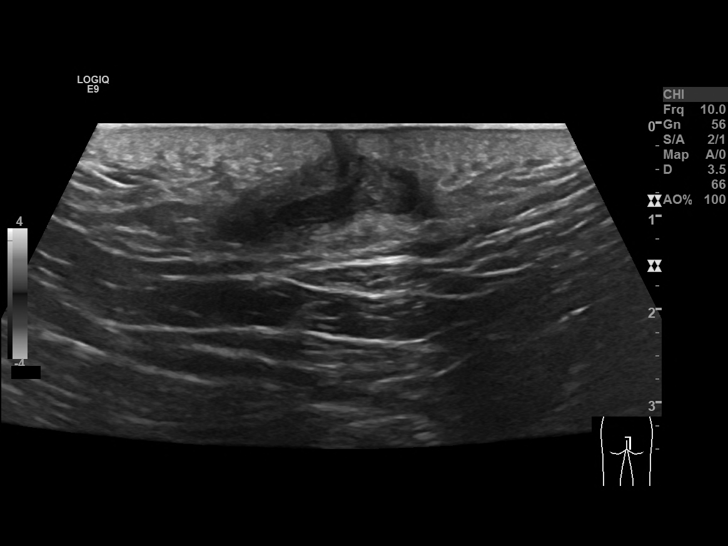
[im 4/22]
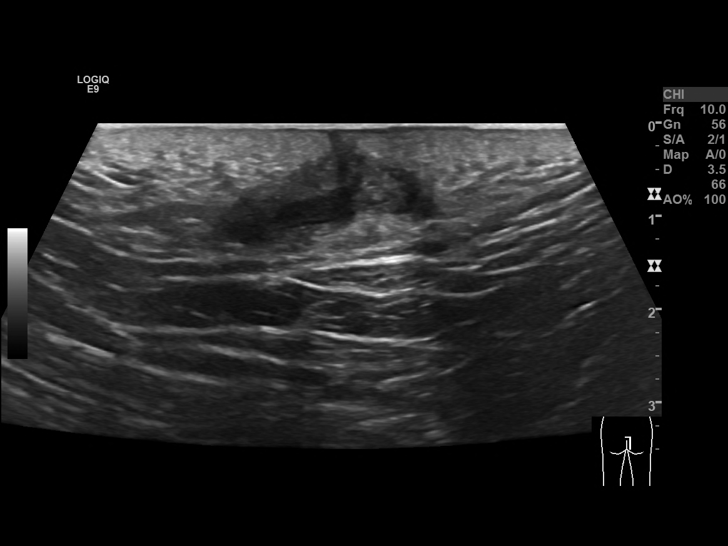
[im 6/22]
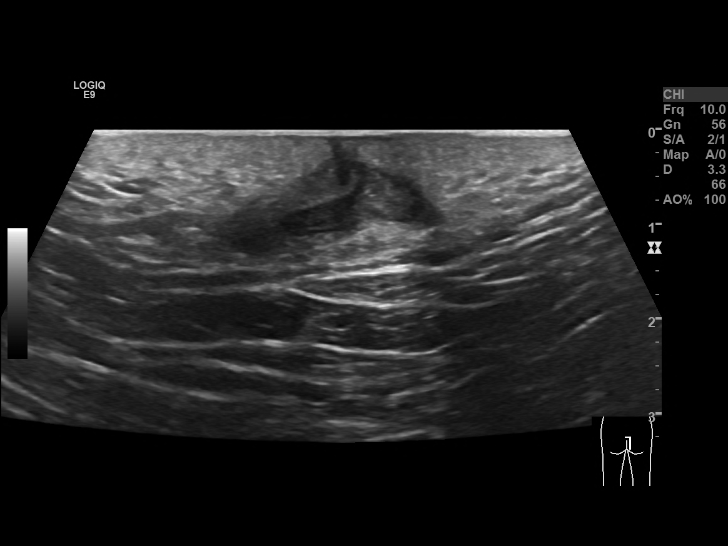
[im 8/22]
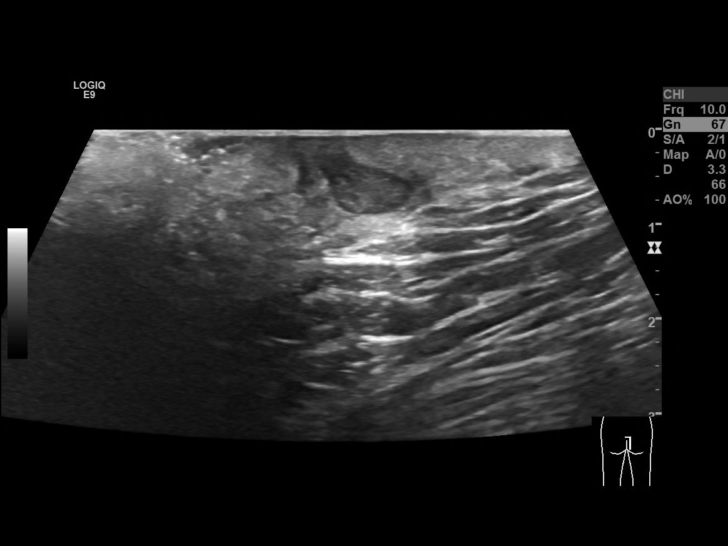
[im 9/22]
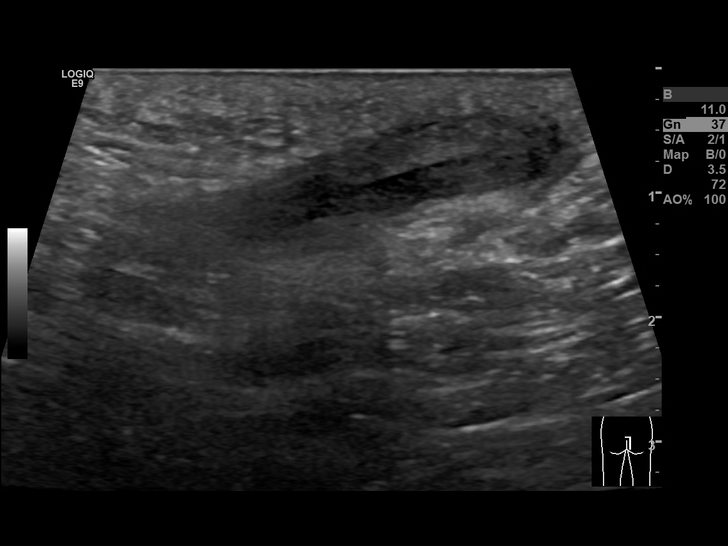
[im 11/22]
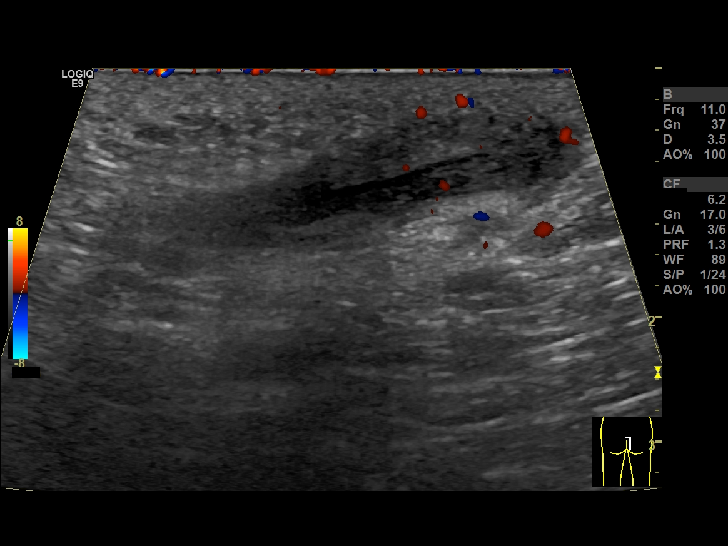
[im 12/22]
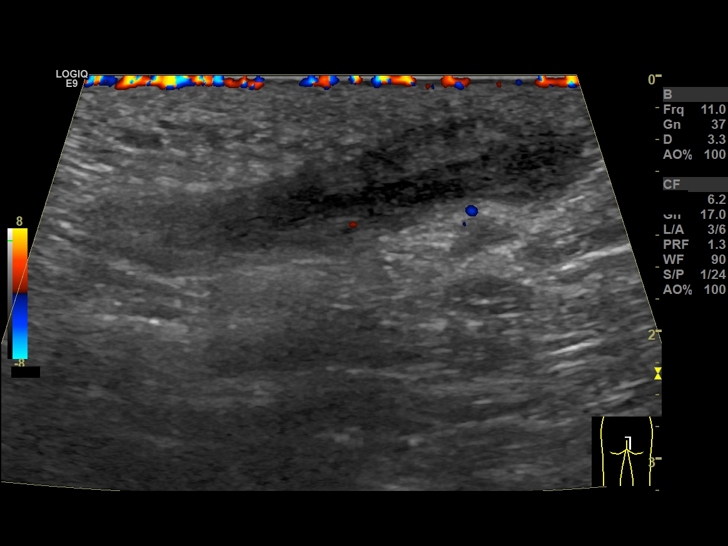
[im 14/22]
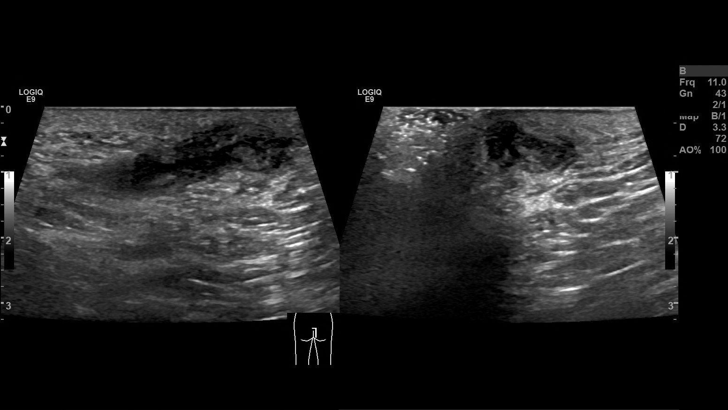
[im 15/22]
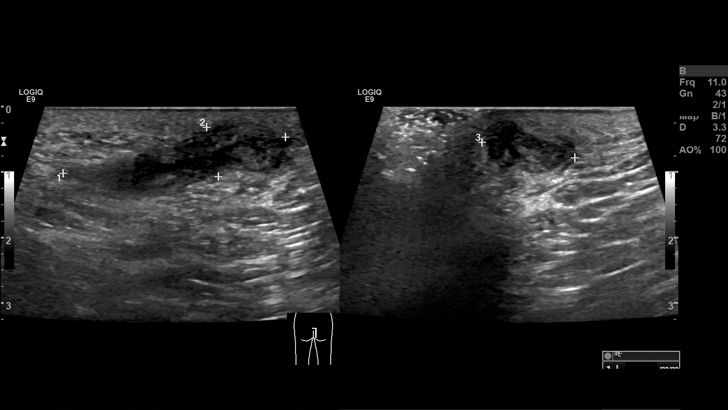
[im 17/22]
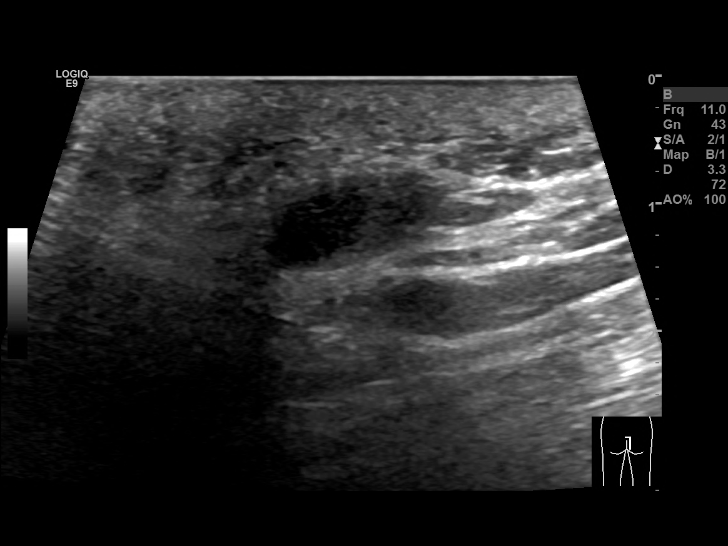
[im 19/22]
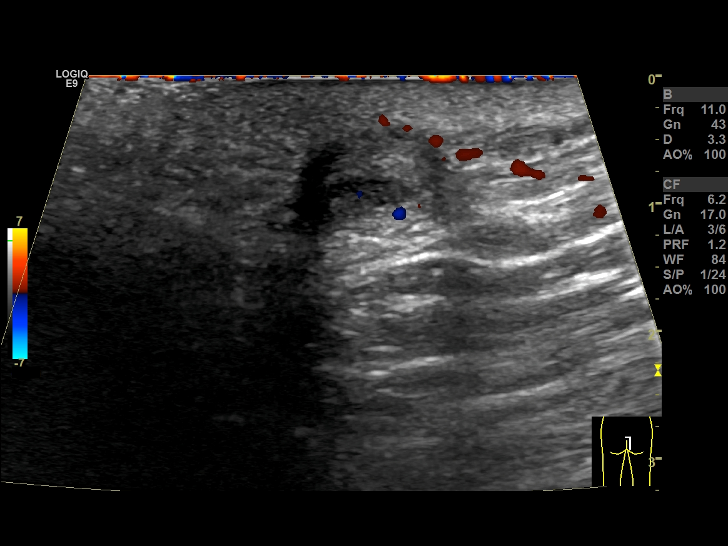
[im 20/22]
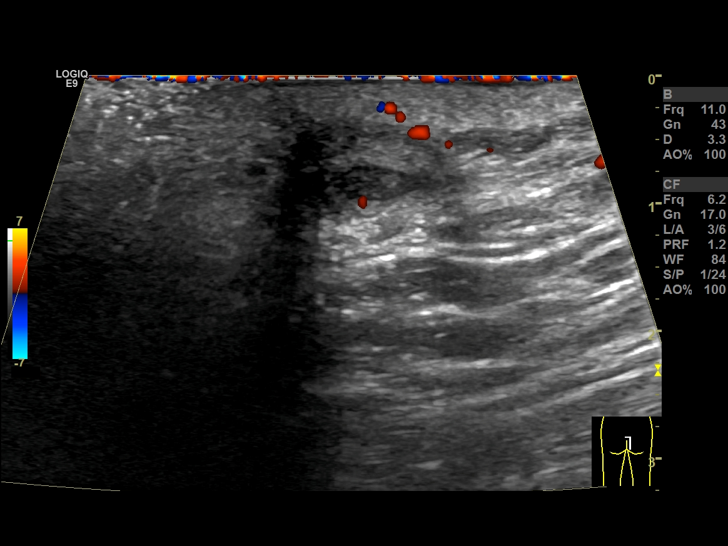
[im 22/22]
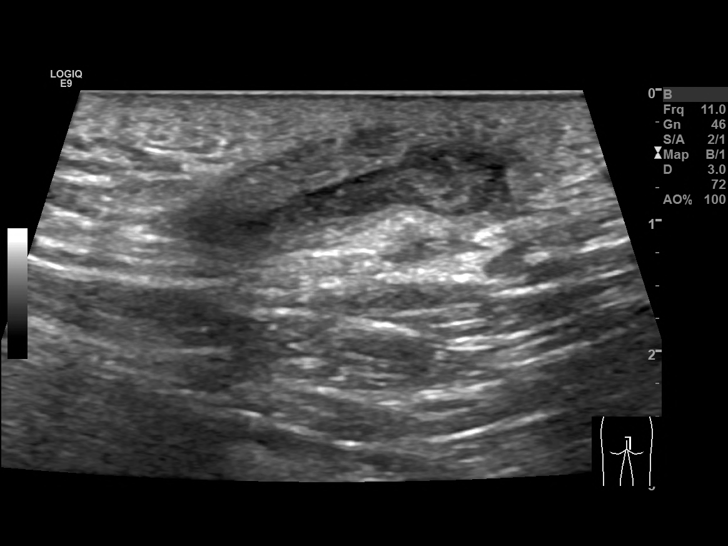

[14 of 22 positions shown; findings below may reference images not displayed]

REASON FOR EXAM

*
Rectal abscess

COMPARISON

*
CT abdomen pelvis 11/17/2019

TECHNIQUE

*
Multiple gray-scale and color flow images of the perineum were obtained with ultrasound

FINDINGS

Right Perianal Tissues

*
34 x 8 x 14 mm hypoechoic lesion

*
Heterogeneous

*
Connected to the skin surface

*
Deep connection (probably the anus) poorly delineated on this exam

IMPRESSION

*
Perianal fistula

## 2021-10-05 ENCOUNTER — Ambulatory Visit
Admit: 2021-10-05 | Discharge: 2021-10-05 | Disposition: A | Discharge status: Home / Self Care | Payer: BLUE CROSS/BLUE SHIELD | Attending: Diagnostic Radiology | Admitting: Diagnostic Radiology

## 2021-10-05 DIAGNOSIS — Q283 Other malformations of cerebral vessels: Secondary | ICD-10-CM | POA: Insufficient documentation

## 2021-10-08 ENCOUNTER — Other Ambulatory Visit: Payer: Self-pay

## 2021-10-10 ENCOUNTER — Encounter: Payer: Self-pay | Admitting: Neurosurgery

## 2021-10-10 ENCOUNTER — Other Ambulatory Visit: Payer: Self-pay

## 2021-10-10 ENCOUNTER — Ambulatory Visit: Payer: BLUE CROSS/BLUE SHIELD | Attending: Neurosurgery | Admitting: Neurosurgery

## 2021-10-10 VITALS — BP 201/124 | HR 73 | Temp 97.0°F | Resp 16 | Ht 67.0 in | Wt 173.1 lb

## 2021-10-10 DIAGNOSIS — Q283 Other malformations of cerebral vessels: Secondary | ICD-10-CM

## 2021-10-10 DIAGNOSIS — Z6827 Body mass index (BMI) 27.0-27.9, adult: Secondary | ICD-10-CM | POA: Insufficient documentation

## 2021-10-10 NOTE — Progress Notes (Signed)
 Vara Guardian, MD  [facility]  Department of Neurological Surgery  [address] [place] [address]  [place], [place] [address]   [email or web address]  New Referrals: [phone] Fax: [phone]

## 2021-10-23 ENCOUNTER — Encounter: Payer: Self-pay | Admitting: Nurse Practitioner

## 2021-10-23 ENCOUNTER — Ambulatory Visit (INDEPENDENT_AMBULATORY_CARE_PROVIDER_SITE_OTHER): Payer: BLUE CROSS/BLUE SHIELD | Admitting: Nurse Practitioner

## 2021-10-23 VITALS — BP 189/100 | HR 78 | Temp 97.8°F | Resp 17 | Ht 67.0 in | Wt 175.5 lb

## 2021-10-23 DIAGNOSIS — Z7189 Other specified counseling: Secondary | ICD-10-CM

## 2021-10-23 DIAGNOSIS — I10 Essential (primary) hypertension: Secondary | ICD-10-CM

## 2021-10-23 DIAGNOSIS — G4733 Obstructive sleep apnea (adult) (pediatric): Secondary | ICD-10-CM

## 2021-10-23 MED ORDER — BLOOD PRESSURE MONITORING SOLN KIT
PACK | Status: DC
Start: 2021-10-16 — End: 2022-01-13

## 2021-10-23 NOTE — Patient Instructions (Signed)
#.   We resent an order for the heated tube and CPAP chin strap to your medical supply company. Please call them if you do not hear anything by Friday or early next week at: [place], Phone: [phone]

## 2021-10-23 NOTE — Progress Notes (Signed)
 I had the pleasure of seeing Keith Cross at the [facility] for follow up of OSA on PAP therapy. He is a 60 year old male with PMH of cerebral cavernoma and OSA who has been undergoing management with PAP treatment. He was diagnosed with OSA on

## 2021-11-07 ENCOUNTER — Other Ambulatory Visit: Payer: Self-pay

## 2021-11-07 NOTE — Progress Notes (Signed)
Daughter called to advise that PT is not needing refill at this time. Still has supply on hand. Prefers to call us when refill is needed.    Suspending follow up until PT reached out

## 2021-11-07 NOTE — Progress Notes (Signed)
LVM for patient refill Nurtec

## 2021-11-19 ENCOUNTER — Telehealth: Payer: Self-pay | Admitting: Nurse Practitioner

## 2021-11-19 NOTE — Telephone Encounter (Signed)
Pt's daughter is calling to speak to Harold Barban regarding patient.    Pt's daughter said she would prefer to be called before 4pm and said it was okay for Luanna Cole to leave a voicemail or message via [employer].    Please advise pt and pt's daughter.

## 2021-11-20 ENCOUNTER — Encounter: Payer: Self-pay | Admitting: Nurse Practitioner

## 2021-11-20 DIAGNOSIS — G4733 Obstructive sleep apnea (adult) (pediatric): Secondary | ICD-10-CM

## 2021-11-20 NOTE — Telephone Encounter (Signed)
Responded to via [facility]

## 2021-11-26 ENCOUNTER — Encounter: Payer: Self-pay | Admitting: Nurse Practitioner

## 2021-11-26 ENCOUNTER — Ambulatory Visit: Payer: BLUE CROSS/BLUE SHIELD | Admitting: Nurse Practitioner

## 2021-11-26 VITALS — BP 140/91 | HR 71 | Resp 16 | Ht 67.0 in | Wt 175.7 lb

## 2021-11-26 DIAGNOSIS — Z7189 Other specified counseling: Secondary | ICD-10-CM

## 2021-11-26 DIAGNOSIS — G4733 Obstructive sleep apnea (adult) (pediatric): Secondary | ICD-10-CM

## 2021-11-26 DIAGNOSIS — E663 Overweight: Secondary | ICD-10-CM

## 2021-11-26 NOTE — Patient Instructions (Signed)
#.   Please see below for recommended minimum frequency of changing PAP supplies:   Mask/Cushion - 1x every 3 month   Headgear - 1x every 6 months  Filters-Disposable - 1x every 1 month  Heated Tubing - 1x every 3 months  Humidifier Chamber - 1x every 6 mont

## 2021-11-26 NOTE — Progress Notes (Signed)
 I had the pleasure of seeing Keith Cross at the [facility] for follow up of OSA on PAP therapy. He is a 61 year old male with PMH of cerebral cavernoma and OSA who has been undergoing management with PAP treatment. He was diagnosed with OSA on

## 2021-12-26 NOTE — Progress Notes (Signed)
 I had the pleasure of seeing Keith Cross at the [facility] for follow up of OSA on PAP therapy. He is a 61 year old male with PMH of cerebral cavernoma and OSA who has been undergoing management with PAP treatment. He was diagnosed with OSA on

## 2021-12-27 ENCOUNTER — Encounter: Payer: Self-pay | Admitting: Nurse Practitioner

## 2021-12-27 ENCOUNTER — Ambulatory Visit: Payer: BLUE CROSS/BLUE SHIELD | Admitting: Nurse Practitioner

## 2021-12-27 VITALS — BP 123/77 | HR 70 | Ht 67.0 in | Wt 177.6 lb

## 2021-12-27 DIAGNOSIS — Z7189 Other specified counseling: Secondary | ICD-10-CM

## 2021-12-27 DIAGNOSIS — G4733 Obstructive sleep apnea (adult) (pediatric): Secondary | ICD-10-CM

## 2022-01-06 NOTE — Progress Notes (Signed)
 [facility] Cardiology Clinic Note    Patient: Keith Cross  Patient DOB: 1961-09-09  Patient ID: [id number]  Patient PCP: Suzan Slick  Date of Service: 01/08/2022    Reason for Visit  Follow Up       HPI  [name] is a 61 year old male    Interval History

## 2022-01-08 ENCOUNTER — Ambulatory Visit: Payer: BLUE CROSS/BLUE SHIELD | Attending: Cardiovascular Disease | Admitting: Cardiovascular Disease

## 2022-01-08 ENCOUNTER — Ambulatory Visit: Payer: Self-pay | Admitting: Cardiovascular Disease

## 2022-01-08 VITALS — BP 116/74 | HR 66 | Resp 18

## 2022-01-08 DIAGNOSIS — I2510 Atherosclerotic heart disease of native coronary artery without angina pectoris: Secondary | ICD-10-CM | POA: Insufficient documentation

## 2022-01-08 DIAGNOSIS — M79605 Pain in left leg: Secondary | ICD-10-CM | POA: Insufficient documentation

## 2022-01-08 DIAGNOSIS — I119 Hypertensive heart disease without heart failure: Secondary | ICD-10-CM | POA: Insufficient documentation

## 2022-01-08 DIAGNOSIS — M79604 Pain in right leg: Secondary | ICD-10-CM

## 2022-01-08 DIAGNOSIS — R079 Chest pain, unspecified: Secondary | ICD-10-CM | POA: Insufficient documentation

## 2022-01-08 DIAGNOSIS — I359 Nonrheumatic aortic valve disorder, unspecified: Secondary | ICD-10-CM | POA: Insufficient documentation

## 2022-01-08 NOTE — Consults (Signed)
 [address], Hai.   Things go up and down, medication doesn't help.  BP labile 160-170 systolic to 100.  Still has dizziness with lower blood pressures.       Interpreter disconnected:  New interpreter Angela [id number]  130-140, higher BP was worried    Metoprolol 5

## 2022-01-10 NOTE — Progress Notes (Signed)
 Artis Flock, Nurse Practitioner  [facility]   [address] [place], [place] [address]  P: [phone] F: [phone]    Date of Evaluation: 01/13/2022  Referring Physician: Self, Referred  Primary Care Physician: Suzan Slick    Chief Complaint: headac

## 2022-01-12 ENCOUNTER — Encounter: Payer: Self-pay | Admitting: Cardiovascular Disease

## 2022-01-13 ENCOUNTER — Other Ambulatory Visit: Payer: Self-pay

## 2022-01-13 ENCOUNTER — Encounter: Payer: Self-pay | Admitting: Nurse Practitioner

## 2022-01-13 ENCOUNTER — Ambulatory Visit: Payer: BLUE CROSS/BLUE SHIELD | Attending: Nurse Practitioner | Admitting: Nurse Practitioner

## 2022-01-13 ENCOUNTER — Encounter: Payer: Self-pay | Admitting: Cardiovascular Disease

## 2022-01-13 VITALS — BP 154/95 | HR 79 | Temp 98.4°F | Resp 16 | Ht 67.0 in | Wt 176.4 lb

## 2022-01-13 DIAGNOSIS — Z8673 Personal history of transient ischemic attack (TIA), and cerebral infarction without residual deficits: Secondary | ICD-10-CM | POA: Insufficient documentation

## 2022-01-13 DIAGNOSIS — G43009 Migraine without aura, not intractable, without status migrainosus: Secondary | ICD-10-CM

## 2022-01-13 DIAGNOSIS — G4733 Obstructive sleep apnea (adult) (pediatric): Secondary | ICD-10-CM

## 2022-01-13 DIAGNOSIS — D1800 Hemangioma unspecified site: Secondary | ICD-10-CM

## 2022-01-13 DIAGNOSIS — Z6827 Body mass index (BMI) 27.0-27.9, adult: Secondary | ICD-10-CM | POA: Insufficient documentation

## 2022-01-13 MED ORDER — POTASSIUM CHLORIDE CRYS CR 20 MEQ OR TBCR
EXTENDED_RELEASE_TABLET | ORAL | Status: DC
Start: 2021-10-31 — End: 2022-01-13

## 2022-01-13 MED ORDER — GALCANEZUMAB-GNLM 120 MG/ML SC SOAJ
SUBCUTANEOUS | 0 refills | Status: AC
Start: 2022-01-13 — End: 2022-02-12
  Filled 2022-01-13: qty 2, 30d supply, fill #0

## 2022-01-13 MED ORDER — FUROSEMIDE 40 MG OR TABS
ORAL_TABLET | ORAL | Status: DC
Start: 2021-10-31 — End: 2022-01-13

## 2022-01-13 MED ORDER — GALCANEZUMAB-GNLM 120 MG/ML SC SOAJ
120.0000 mg | SUBCUTANEOUS | 11 refills | Status: AC
Start: 2022-01-13 — End: ?
  Filled 2022-01-13: qty 1, 30d supply, fill #0

## 2022-01-13 MED ORDER — PANTOPRAZOLE SODIUM 40 MG OR TBEC
DELAYED_RELEASE_TABLET | ORAL | Status: DC
Start: 2021-12-19 — End: 2022-01-13

## 2022-01-13 MED ORDER — LOSARTAN POTASSIUM 100 MG OR TABS
ORAL_TABLET | ORAL | Status: DC
Start: 2021-10-31 — End: 2022-01-13

## 2022-01-13 MED ORDER — METFORMIN HCL 1000 MG OR TABS
ORAL_TABLET | ORAL | Status: DC
Start: 2022-01-04 — End: 2022-01-13

## 2022-01-13 MED ORDER — CLONIDINE HCL 0.1 MG OR TABS
ORAL_TABLET | ORAL | Status: DC
Start: 2021-10-31 — End: 2022-01-13

## 2022-01-13 MED ORDER — TRUE METRIX BLOOD GLUCOSE TEST VI STRP
ORAL_STRIP | Status: DC
Start: 2021-12-19 — End: 2022-01-13

## 2022-01-13 MED ORDER — CELECOXIB 200 MG OR CAPS
ORAL_CAPSULE | ORAL | Status: DC
Start: 2021-12-19 — End: 2022-01-13

## 2022-01-13 MED ORDER — ATORVASTATIN CALCIUM 40 MG OR TABS
ORAL_TABLET | ORAL | Status: DC
Start: 2021-10-31 — End: 2022-01-13

## 2022-01-13 NOTE — Patient Instructions (Addendum)
 Follow up as scheduled with Sleep Medicine-gradually increase use of CPAP machine  Follow up as scheduled with Cardiology  Follow up as scheduled with neurosurgery  Continue rimegepant ODT 75mg  once daily as needed. Do not exceed 1 tablet per day.  Start

## 2022-01-13 NOTE — Progress Notes (Signed)
Emgality   LVM for first fill

## 2022-01-13 NOTE — Progress Notes (Unsigned)
 Specialty Pharmacy Prior Auth Status   Medication: Emgality 120mg /ml Pen Loading and Maintenance  Medication sig:   Inject 2 mL (240 mg) under the skin once. Then start maintenance dose 30 days later  &  Inject 1 mL (120 mg) under the skin every 30 days.

## 2022-01-15 ENCOUNTER — Other Ambulatory Visit: Payer: Self-pay

## 2022-01-15 NOTE — Progress Notes (Signed)
Emgality   Sent mychart for first fill

## 2022-01-17 ENCOUNTER — Other Ambulatory Visit: Payer: Self-pay

## 2022-01-17 NOTE — Progress Notes (Signed)
3rd attempt   Emgality   LVM for first fill

## 2022-01-22 IMAGING — CR CHEST 2 VWS PA LAT
2 series · 2 of 2 positions shown · non-contrast
Comparison: None

HISTORY/INDICATIONS:  Pneumonia
TECHNIQUE: Chest 2 views.

[w chest pa]
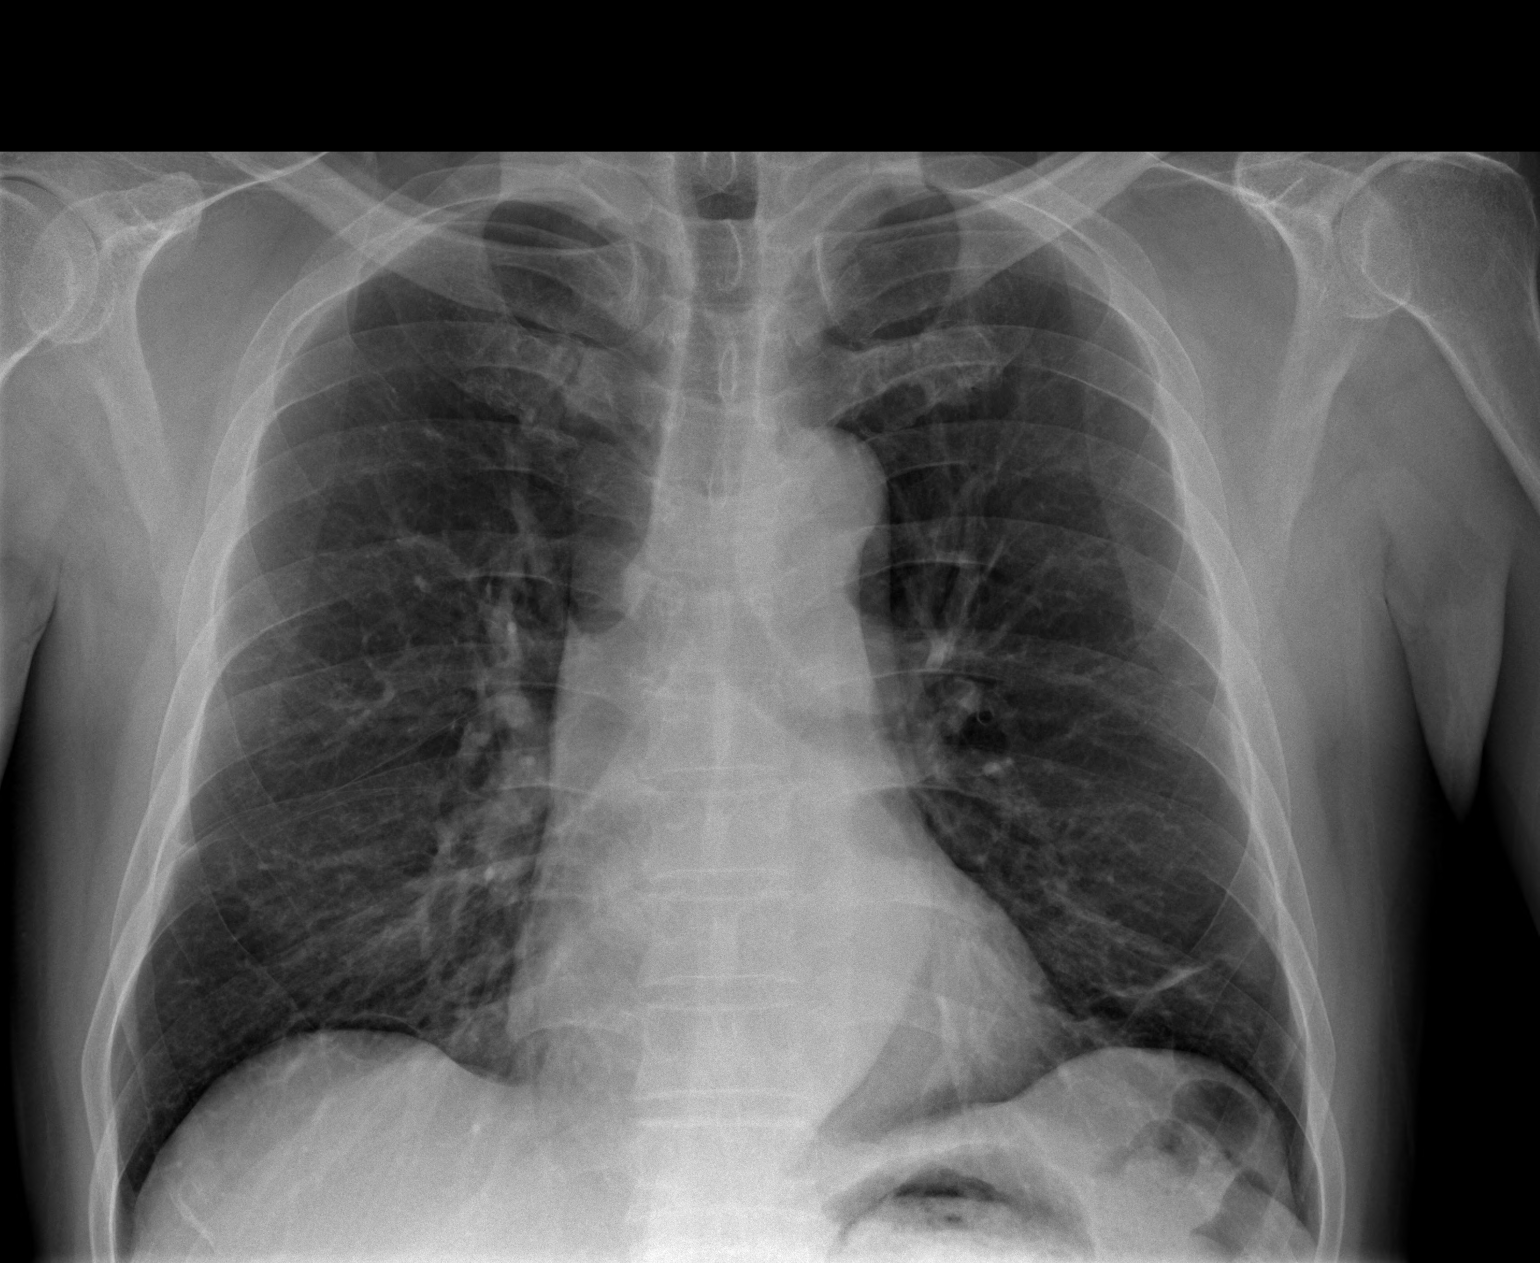

[w chest lat]
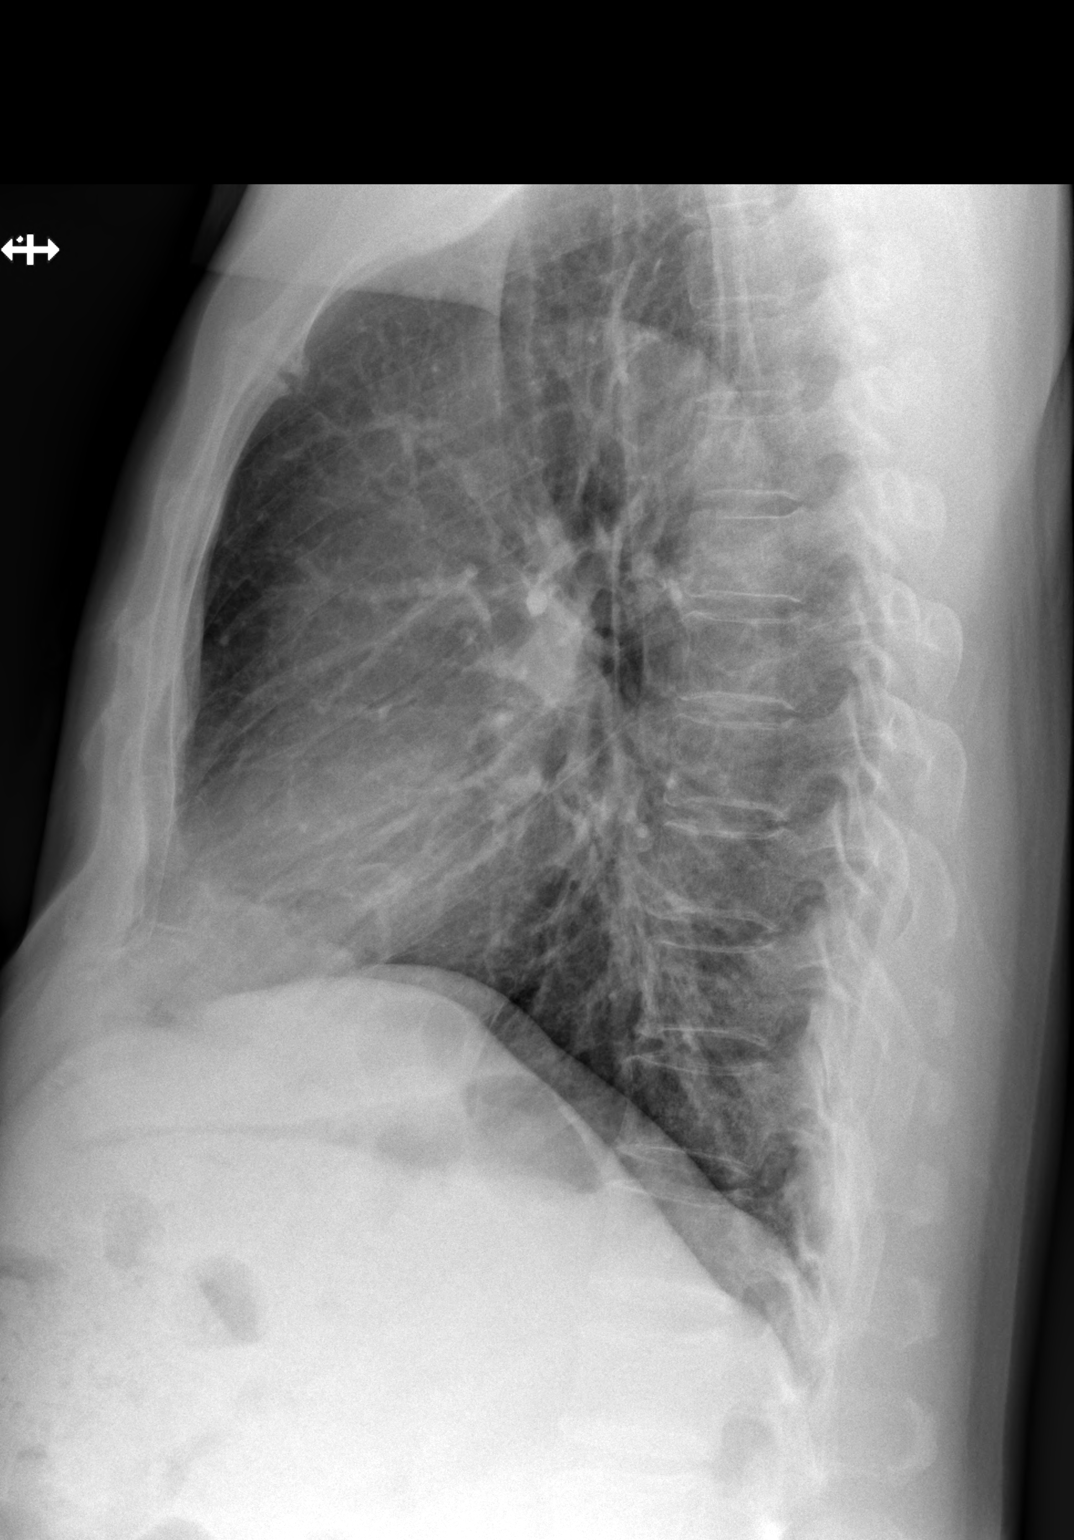

[2 of 2 positions shown; findings below may reference images not displayed]

FINDINGS: Cardiac size and pulmonary vascularity are normal.  The mediastinal silhouette is unremarkable. The lungs are slightly overexpanded with a few fibrotic looking strands in the bases. No effusions. Slightly ectatic aorta.
IMPRESSION: No acute disease.

## 2022-01-29 ENCOUNTER — Other Ambulatory Visit
Admission: RE | Admit: 2022-01-29 | Discharge: 2022-01-29 | Disposition: A | Discharge status: Home Health | Payer: BLUE CROSS/BLUE SHIELD

## 2022-01-29 ENCOUNTER — Ambulatory Visit
Payer: BLUE CROSS/BLUE SHIELD | Attending: Surgery | Admitting: Student in an Organized Health Care Education/Training Program

## 2022-01-29 VITALS — BP 117/74 | HR 76 | Ht 67.0 in | Wt 175.7 lb

## 2022-01-29 DIAGNOSIS — D3500 Benign neoplasm of unspecified adrenal gland: Secondary | ICD-10-CM | POA: Insufficient documentation

## 2022-01-29 DIAGNOSIS — Z6827 Body mass index (BMI) 27.0-27.9, adult: Secondary | ICD-10-CM

## 2022-01-29 DIAGNOSIS — E119 Type 2 diabetes mellitus without complications: Secondary | ICD-10-CM | POA: Insufficient documentation

## 2022-01-29 DIAGNOSIS — E042 Nontoxic multinodular goiter: Secondary | ICD-10-CM | POA: Insufficient documentation

## 2022-01-29 LAB — COMPREHENSIVE METABOLIC PANEL, BLOOD
ALT: 33 U/L (ref 7–52)
AST: 22 U/L (ref 13–39)
Albumin: 4 G/DL — ABNORMAL LOW (ref 4.2–5.5)
Alk Phos: 46 U/L (ref 34–104)
BUN: 20 mg/dL (ref 7–25)
Bilirubin, Total: 1 mg/dL (ref 0.0–1.4)
CO2: 26 mmol/L (ref 21–31)
Calcium: 9.2 mg/dL (ref 8.6–10.3)
Chloride: 101 mmol/L (ref 98–107)
Creat: 1.2 mg/dL (ref 0.7–1.3)
Electrolyte Balance: 8 mmol/L (ref 2–12)
Glucose: 126 mg/dL — ABNORMAL HIGH (ref 85–125)
Potassium: 3.9 mmol/L (ref 3.5–5.1)
Protein, Total: 7.2 G/DL (ref 6.0–8.3)
Sodium: 135 mmol/L — ABNORMAL LOW (ref 136–145)
eGFR - high estimate: >60 (ref >59)
eGFR - low estimate: >60 (ref >59)

## 2022-01-29 LAB — RANDOM URINE MICROALB/CREAT RATIO PANEL
Creatinine, G/L: 1 g/L
Microalbumin/Creatinine Ratio: 14 MG MICAL/GM CRE
Microalbumin: 14 MG/L

## 2022-01-29 LAB — HEMOGLOBIN A1C, POINT OF CARE TESTING: Hemoglobin A1c, POC: 7.3 % — ABNORMAL HIGH (ref 4.0–5.6)

## 2022-01-29 MED ORDER — EMPAGLIFLOZIN 25 MG PO TABS
25.0000 mg | ORAL_TABLET | Freq: Every day | ORAL | 3 refills | Status: AC
Start: 2022-01-29 — End: ?

## 2022-01-29 NOTE — Progress Notes (Signed)
 ENDOCRINOLOGY OUTPATIENT VISIT - [facility]    Date and Time of Evaluation:  Wednesday January 29, 2022    Primary Care Physician:  Hoover Brunette [facility]     Chief Complaint:   Chief Complaint   Patient presents with   . Recheck       History of Present Illne

## 2022-01-29 NOTE — Patient Instructions (Addendum)
 Labs today  Please perform 3 late night salivary cortisol swabs and turn these in when completed  Please call radiology at [phone] to schedule your thyroid ultrasound  Continue metformin XR 1000 mg twice daily  Start empagliflozin (Jardiance) 25 mg

## 2022-01-31 LAB — ALDOSTERONE, BLOOD: Aldosterone: 20.3 ng/dL

## 2022-02-02 LAB — RENIN, BLOOD: Renin: 3.9 ng/mL/hr

## 2022-02-03 LAB — METANEPHRINES, PLASMA
Metanephrine,Free: 0.44 nmol/L (ref <0.50)
Normetanephrine,Free: 0.93 nmol/L — ABNORMAL HIGH (ref <0.90)

## 2022-02-12 ENCOUNTER — Ambulatory Visit: Payer: BLUE CROSS/BLUE SHIELD

## 2022-03-06 ENCOUNTER — Ambulatory Visit
Admission: RE | Admit: 2022-03-06 | Discharge: 2022-03-06 | Disposition: A | Discharge status: Home / Self Care | Payer: BLUE CROSS/BLUE SHIELD | Attending: Student in an Organized Health Care Education/Training Program | Admitting: Student in an Organized Health Care Education/Training Program

## 2022-03-06 DIAGNOSIS — E042 Nontoxic multinodular goiter: Secondary | ICD-10-CM

## 2022-03-07 ENCOUNTER — Ambulatory Visit
Admission: RE | Admit: 2022-03-07 | Discharge: 2022-03-07 | Disposition: A | Discharge status: Home / Self Care | Payer: BLUE CROSS/BLUE SHIELD | Attending: Cardiovascular Disease | Admitting: Cardiovascular Disease

## 2022-03-07 DIAGNOSIS — M79605 Pain in left leg: Secondary | ICD-10-CM

## 2022-03-07 DIAGNOSIS — M79604 Pain in right leg: Secondary | ICD-10-CM

## 2022-03-17 NOTE — Progress Notes (Signed)
[facility] Note    Patient: Keith Cross  Patient DOB: 07-30-1961  Patient ID: [id number]  Patient PCP: Emmaline Kluver  Date of Service: 03/19/2022    Reason for Visit  Follow Up       HPI  Keith Cross is a 61 year old male    Interval History  Patients last seen 01/08/22    [facility] ([place]) [address]    Still having headache and chest pain.  Blood pressure still labile but not as high as previous.  150/90 average.  Ranges from 100/70 to 160/90    Problem List  Patient Active Problem List    Diagnosis Date Noted   . Cerebral cavernoma 07/04/2021   . OSA (obstructive sleep apnea) 04/17/2021   . Chest pain 03/27/2021   . Abnormal stress echo 03/27/2021        PMH  Past Medical History:   Diagnosis Date   . History of CVA (cerebrovascular accident)    . Hyperlipidemia    . Hypertension    . Type 2 diabetes mellitus (CMS-HCC)        Meds  Current Outpatient Medications   Medication Sig   . amLODIPINE (NORVASC) 10 MG tablet Take 1 tablet (10 mg) by mouth daily.   Marland Kitchen aspirin 81 MG EC tablet Take 1 tablet (81 mg) by mouth daily.   Marland Kitchen atorvastatin (LIPITOR) 80 MG tablet Take 1 tablet (80 mg) by mouth daily.   . empagliflozin (JARDIANCE) 25 mg tablet Take 1 tablet (25 mg) by mouth daily.   Marland Kitchen galcanezumab-gnlm (EMGALITY) 120 mg/mL injection pen Inject 1 mL (120 mg) under the skin every 30 days.   . hydrochlorothiazide (HYDRODIURIL) 25 MG tablet Take 1 tablet (25 mg) by mouth daily.   . isosorbide mononitrate (IMDUR) 30 MG Controlled-Release tablet Take 1 tablet (30 mg) by mouth every morning.   . meclizine (ANTIVERT) 25 MG tablet    . metFORMIN (GLUCOPHAGE XR) 500 mg XR tablet Take 1 tablet (500 mg) by mouth 2 times daily. (Patient taking differently: Take 2 tablets (1,000 mg) by mouth daily.)   . metoprolol succinate (TOPROL XL) 100 MG XL tablet Take 1 tablet (100 mg) by mouth daily. (Patient taking differently: Take 1 tablet (100 mg) by mouth daily. 50 MG TAB DAILY)   . rimegepant (NURTEC ODT) tablet Dissolve 1 tablet (75 mg) on  or under the tongue daily as needed (to treat migraines). Do not take more than one tablet per day.   . venlafaxine (EFFEXOR) 37.5 MG tablet Take 1 tablet (37.5 mg) by mouth 2 times daily.     Current Facility-Administered Medications   Medication   . nitroGLYcerin (NITROLINGUAL) spray 1 spray       All  No Known Allergies    ROS  A comprehensive review of 12 organ systems was performed and reviewed with the patient. Review of systems with the patient was negative except as indicated in the HPI above.    SH  Social History     Socioeconomic History   . Marital status: Married     Spouse name: Not on file   . Number of children: Not on file   . Years of education: Not on file   . Highest education level: Not on file   Occupational History   . Not on file   Tobacco Use   . Smoking status: Never   . Smokeless tobacco: Never   Vaping Use   . Vaping status: Not on  file   Substance and Sexual Activity   . Alcohol use: Not Currently   . Drug use: Not on file   . Sexual activity: Not on file   Other Topics Concern   . Not on file   Social History Narrative   . Not on file     Social Determinants of Health     Financial Resource Strain: Not on file   Food Insecurity: Not on file   Transportation Needs: Not on file   Physical Activity: Not on file   Stress: Not on file   Social Connections: Not on file   Intimate Partner Violence: Not on file   Housing Stability: Not on file     FH  No family history on file.    Past Surgical History:  No date: Left knee surgery     Physical Exam  BP 157/97, HR 78  Gen: pleasant, in NAD  Neuro: A&Ox3, no focal deficit  HEENT:  NC/AT. MMM  Neck: no bruit. JVD   CV: regular rate, normal rhythm, S1 S2   Resp: clear, no rales  Abd:  NABS soft ntnd  Ext: no c/c/e  Vasc: 2+ radial, DP pulses. Cap refill < 3 sec. Periphery warm    Labs  Lab Results   Component Value Date    RBC 5.34 03/28/2021    HGB 15.0 03/28/2021    HCT 46.0 03/28/2021    MCV 86.2 03/28/2021    MCHC 32.7 03/28/2021    RDW 14.5  (H) 03/28/2021    PLT 195 03/28/2021     Lab Results   Component Value Date    BUN 20 01/29/2022    CREAT 1.2 01/29/2022    CL 101 01/29/2022    K 3.9 01/29/2022    [place] 9.2 01/29/2022    TBILI 1.0 01/29/2022    ALB 4.0 (L) 01/29/2022    AST 22 01/29/2022    ALK 46 01/29/2022    ALT 33 01/29/2022    GLU 126 (H) 01/29/2022     Lab Results   Component Value Date    INR 0.95 03/27/2021    PTT 28.5 03/27/2021       Lab Results   Component Value Date    BNP 21 03/27/2021     Lab Results   Component Value Date    A1C 7.3 (H) 01/29/2022     Lab Results   Component Value Date    CHOL 97 06/19/2021    LDLCALC 33 06/19/2021    TRIG 98 06/19/2021       Other Diagnostics  Coronary angiogram:  [facility] on 03/27/2021:  CONCLUSIONS:  1. Mid LAD with 50% stenosis. RFR of mid LAD was 0.95. This was physiologically non-significant.  2. LVEDP 20-25 mm Hg.    RECOMMENDATIONS:  1. Maximize medical therapy.  2. Aggressive risk factor modification efforts.  3. Monitor vitals and arterial access site/pulses.    Echocardiogram 03/27/21:   1. Mildly decreased left ventricular systolic function. The left ventricular ejection fraction is 51% by Biplane.   2. Left ventricular ejection fraction is 53% by 3D.   3. Mild concentric left ventricular hypertrophy.   4. Normal LV filling pressures.   5. Normal right ventricular size and systolic function.    Exercise stress echocardiogram 03/27/21:   1. Positive exercise stress test for ischemia.   2. Patient developed chest pain at peak excercise that improved with recovery and rest.   3. Stage 1: 1: Mid and apical  inferior wall is abnormal.      Stage 2: 2: Apical inferior segment is abnormal.   4. The peak estimated pulmonary artery systolic pressure during exercise was indeterminate.   5. Average functional capacity.    Assessment and plan:   Keith Cross is a 61 year old male    #Hypertension  -Suspect significant improvement due to initiating CPAP for sleep apnea  -Currently on amlodipine 10 mg daily,  metoprolol 50 mg XL daily, imdur 30 mg daily,HCTZ 25 mg daily,   -above goal, convert metoprolol to carvedilol 25 mg BID  -Having labile Bps, patient will send me home blood pressure log for adjustments  -Prior secondary hypertension work-up negative for hyperaldosteronism and pheochromocytoma.  Normal creatinine.  His left arm blood pressure was actually higher than his right arm blood pressure which does not suggest coarctation of the aorta.    -Bilateral renal artery stenosis unlikely with normal creatinine on ARB, no hemodynamically significant renal artery stenosis on duplex US  -Following with sleep medicine for treatment of severe OSA    #Coronary artery disease  -Moderate non obstructive disease found on coronary angiogram  -Aspirin 81 mg daily, carvedilol 25 mg BID, atorvastatin 80 mg daily  -He had abnormal exercise stress echocardiogram and was referred for coronary angiogram as above which showed non obstructive disease  -Consider microvascular disease?  Control blood pressure as above.  Imdur 30 mg daily.  Will make BP adjustments    #Hyperlipidemia  -Goal LDL<70 mg/dL given CAD  -FLP 04/17/21: tc 167, hdl 47, ldL 93, TG 137  -FLP 06/19/21: TC 97, HDL 44, LDL 33, TG 98  -Atorvastatin 80 mg daily  -Recheck FLP prior to next visit    #Valve issue?  -Unclear issue with the valve.  TTE shows no significant valvular heart disease.  He has TTE in our system from 2016 showing aortic sclerosis with no other signficant valve disease    RTC next available    I spent greater than 30 minutes on the day of the encounter reviewing the patient's diagnostic tests, performing a history and physical exam, writing orders, educating the patient, coordinating patient care with the staff, and documenting in the electronic medical records system.             ICD-10-CM ICD-9-CM    1. Benign hypertensive heart disease without heart failure  I11.9 402.10       2. Chest pain, unspecified type  R07.9 786.50       3. Aortic valve  disorder  I35.9 424.1       4. Coronary artery disease involving native coronary artery of native heart without angina pectoris  I25.10 414.01            There are no Patient Instructions on file for this visit.    Paul Half, MD  Health Sciences Assistant Clinical Professor  Division of Cardiology  Department of [place]

## 2022-03-19 ENCOUNTER — Encounter: Payer: Self-pay | Admitting: Cardiovascular Disease

## 2022-03-19 ENCOUNTER — Ambulatory Visit: Payer: BLUE CROSS/BLUE SHIELD | Attending: Cardiovascular Disease | Admitting: Cardiovascular Disease

## 2022-03-19 DIAGNOSIS — R079 Chest pain, unspecified: Secondary | ICD-10-CM | POA: Insufficient documentation

## 2022-03-19 DIAGNOSIS — E785 Hyperlipidemia, unspecified: Secondary | ICD-10-CM | POA: Insufficient documentation

## 2022-03-19 DIAGNOSIS — I2510 Atherosclerotic heart disease of native coronary artery without angina pectoris: Secondary | ICD-10-CM | POA: Insufficient documentation

## 2022-03-19 DIAGNOSIS — I119 Hypertensive heart disease without heart failure: Secondary | ICD-10-CM | POA: Insufficient documentation

## 2022-03-19 DIAGNOSIS — I359 Nonrheumatic aortic valve disorder, unspecified: Secondary | ICD-10-CM | POA: Insufficient documentation

## 2022-03-19 MED ORDER — CARVEDILOL 25 MG OR TABS
25.0000 mg | ORAL_TABLET | Freq: Two times a day (BID) | ORAL | 3 refills | Status: AC
Start: 2022-03-19 — End: ?

## 2022-03-19 MED ORDER — CARVEDILOL 25 MG OR TABS
25.0000 mg | ORAL_TABLET | Freq: Two times a day (BID) | ORAL | 3 refills | Status: DC
Start: 2022-03-19 — End: 2022-03-19

## 2022-03-19 NOTE — Patient Instructions (Addendum)
I am converting your metoprolol to carvedilol 25 mg twice daily.  Stop taking your metoprolol        My Blood Pressure Log       Take at least two blood pressure readings one minute apart - once in the morning before taking medications and in the evening before dinner.    For best results, remain still for at least 5 minutes. Sit with your back straight and supported and your feet at on the floor.    When you measure your blood pressure, rest your arm on a at surface (such as a table) so your upper arm is at heart level.    Record your blood pressure on this sheet and show it to your doctor at every visit.    Date AM PM  DATE AM  PM

## 2022-03-28 ENCOUNTER — Encounter: Payer: Self-pay | Admitting: Nurse Practitioner

## 2022-03-28 ENCOUNTER — Ambulatory Visit (INDEPENDENT_AMBULATORY_CARE_PROVIDER_SITE_OTHER): Payer: BLUE CROSS/BLUE SHIELD | Admitting: Nurse Practitioner

## 2022-03-28 VITALS — BP 143/87 | HR 74 | Ht 67.0 in | Wt 176.0 lb

## 2022-03-28 DIAGNOSIS — G4733 Obstructive sleep apnea (adult) (pediatric): Secondary | ICD-10-CM

## 2022-03-28 DIAGNOSIS — Z7189 Other specified counseling: Secondary | ICD-10-CM

## 2022-03-28 NOTE — Progress Notes (Signed)
I had the pleasure of seeing Keith Cross at the [facility] for follow up of OSA on PAP therapy.[name] a 36 year oldmale with PMH of cerebral cavernoma and OSA who has been undergoing management with PAP treatment. Hewas diagnosed with OSA on PSG on 06/07/21 with AHI 57.8/hr and O2 nadir 53%. This visit was conducted with Keith Cross, Keith Cross    Machine: Joella Prince 11  Mask: F&P Simplus size medium     Interim History  Since the last visit, Keith Cross reports he is still trying to be consistent with PAP therapy except when he travels. Sometimes when he wears the mask, the water seems to accumulate so he will remove it. He is not sure if he has received a heated tube from his medical supplier. He has not made any adjustments to the humidity since his last visit. He does sometimes notice that air sometimes leaks from the top of the mask     When he previously tried using a nasal mask, he noticed a lot of mask leak when he turned to his side. With nasal pillows, he felt it was a little uncomfortable due to the direct pressure       Epworth Sleep Scale Score: 13    FOSQ Score: 9.5    PAP Download 02/25/22 to 03/26/22  Type of PAP  Type of PAP:: Auto PAP (AIRSENSE 11 AUTOSET)  Pressure  PAP pressure: MIN 5 MAX 15  DATE  Start Date: 02/25/22  Download period  Number of days: 30  Percentage greater than 4 hours  % > 4 Hrs: 7  Average number of hrs per night when using  Average hours: 3HRS 19 MIN  AHI  Residual: 1  CPAP Compliance  Days Used: 11 Days  Total Days in Possession: 30 Days  Percentage of Days Used: 36.67 %  Leaks: 12.3 liters/hour      Review of Systems  10/14 systems reviewed, all negative except where mentioned in HPI or below:      Physical Exam  BP 143/87 (BP Location: Left arm, BP Patient Position: Sitting, BP cuff size: Regular)   Pulse 74   Ht '5\' 7"'$  (1.702 m)   Wt 79.8 kg (176 lb)   SpO2 96%   BMI 27.57 kg/m    General/Constitutional: Well developed,  well nourished male, who looks at stated age of 61 year old. No acute distress.   Neurological: Alert and Oriented x 3. Cranial nerves II through XII are grossly intact. Full range of motion throughout. Normal base and station.    Psychiatric: Normal mental status, mood is euthymic      Assessment  Keith Cross is a 61 year old male with PMH of cerebral cavernoma and OSA who presents today for follow up of OSA currently on device therapy. He is compliant with PAP therapy and experiencing benefit from ongoing treatment.     #. Obstructive Sleep Apnea: baseline very severe with AHI 57.8/hr and O2 nadir 53%. He is benefiting from therapy as evidenced by residual AHI 1/hr on PAP but would benefit from increased adherence as evidenced by usage 36% of days     Plan  #. The following setting changes were made to your machine:   -Humidity level decreased to 3  -Temp set to 20F     #. The tube you currently have is a heated tube  #. Trial mask change to F&P Evora FFM size L. We reviewed his  interface fitting  #. The risks of untreated sleep apnea were reviewed and the functional and cardiovascular benefits of therapy were reviewed.   #. Remember to wear CPAP device at all times when sleeping even when traveling   #. We went over the optional use of the humidifier and the available settings. If you want to adjust the humidity or temperature of the air, go under My Options and manually change the settings   #. It is recommended to not drive or operate heavy machinery while sleepy as this increases the risk of injury or death.   #. Healthy weight loss is encouraged as it may help reduce the severity of underlying sleep breathing disorder.  #. Follow up with NP in 1 month or sooner if needed. Contact clinic if any questions arise prior to the next visit.     Sincerely,  Harold Barban, NP      I have personally provided 35  minutes of clinical care time. Time includes review of therapy data, clarification of symptoms,  troubleshooting issues, communication to arrange next steps and documentation of the above.

## 2022-03-28 NOTE — Patient Instructions (Signed)
#.   If you want to adjust the humidity or temperature of the air, go under My Options and manually change the settings     #. Please remember to wear your CPAP during all hours of sleep

## 2022-05-02 ENCOUNTER — Ambulatory Visit (INDEPENDENT_AMBULATORY_CARE_PROVIDER_SITE_OTHER): Payer: BLUE CROSS/BLUE SHIELD | Admitting: Nurse Practitioner

## 2022-05-02 ENCOUNTER — Encounter: Payer: Self-pay | Admitting: Nurse Practitioner

## 2022-05-02 VITALS — BP 107/73 | HR 108 | Ht 67.0 in | Wt 168.2 lb

## 2022-05-02 DIAGNOSIS — G4733 Obstructive sleep apnea (adult) (pediatric): Secondary | ICD-10-CM

## 2022-05-02 DIAGNOSIS — Z7189 Other specified counseling: Secondary | ICD-10-CM

## 2022-05-02 MED ORDER — ALLOPURINOL 300 MG OR TABS
300.00 mg | ORAL_TABLET | Freq: Every day | ORAL | Status: AC
Start: 2022-03-01 — End: ?

## 2022-05-02 MED ORDER — TRUEPLUS LANCETS 30G MISC
Status: DC
Start: 2022-03-01 — End: 2023-08-20

## 2022-05-02 MED ORDER — FUROSEMIDE 40 MG OR TABS
ORAL_TABLET | ORAL | Status: AC
Start: 2022-03-01 — End: ?

## 2022-05-02 MED ORDER — POTASSIUM CHLORIDE CRYS CR 20 MEQ OR TBCR
EXTENDED_RELEASE_TABLET | ORAL | Status: DC
Start: 2022-03-01 — End: 2023-09-02

## 2022-05-02 MED ORDER — ISOSORBIDE MONONITRATE CR 60 MG OR TB24
ORAL_TABLET | ORAL | Status: DC
Start: 2022-04-26 — End: 2022-07-22

## 2022-05-02 MED ORDER — LOSARTAN POTASSIUM 100 MG OR TABS
100.00 mg | ORAL_TABLET | Freq: Every day | ORAL | Status: DC
Start: 2022-03-01 — End: 2022-07-22

## 2022-05-02 MED ORDER — TRUE METRIX METER W/DEVICE KIT
PACK | Status: DC
Start: 2022-03-01 — End: 2023-08-20

## 2022-05-02 MED ORDER — TRUE METRIX BLOOD GLUCOSE TEST VI STRP
ORAL_STRIP | Status: DC
Start: 2022-03-01 — End: 2023-08-20

## 2022-05-02 MED ORDER — PIOGLITAZONE HCL 30 MG OR TABS: 30.00 mg | ORAL_TABLET | ORAL | Status: AC

## 2022-05-02 MED ORDER — METFORMIN HCL 1000 MG OR TABS
1000.00 mg | ORAL_TABLET | Freq: Two times a day (BID) | ORAL | Status: AC
Start: 2022-03-01 — End: ?

## 2022-05-02 NOTE — Progress Notes (Signed)
I had the pleasure of seeing Keith Cross at the [facility] for follow up of OSA on PAP therapy.[name] a 61 year oldmale with PMH of cerebral cavernoma and OSA who has been undergoing management with PAP treatment. Hewas diagnosed with OSA on PSG on 06/07/21 with AHI 57.8/hr and O2 nadir 53%. This visit was conducted with [name], Keith Cross.     Machine: Resmed Airsense 11  Mask: F&P Evora size L    Interim History  Since the last visit, Keith Cross reports that since changing the mask he feels much more comfortable. He has not had the issues with mask leak. He prefers the F&P simplus mask more than the other ones he has tried before. He feels very hot sometimes as night so ends up taking the mask off. His face is primarily what feels too warm. He feels the mask is very tight when he first puts it on at night, but once he sleeps he may have more leak.     He has not noticed a significant change in his daytime energy on the days after PAP use.     PAP Download 04/02/22 to 05/01/22   Type of PAP  Type of PAP:: Auto PAP  Pressure  PAP pressure: 5-15cmH2O  DATE  Start Date: 04/02/22  Download period  Number of days: 30  Percentage greater than 4 hours  % > 4 Hrs: 33  Average number of hrs per night when using  Average hours: 4 hrs 30 min  AHI  Residual: 5.0  CPAP Compliance  Days Used: 25 Days  Total Days in Possession: 30 Days  Percentage of Days Used: 83 %  Leaks: 32.6 liters/hour         Review of Systems  10/14 systems reviewed, all negative except where mentioned in HPI or below:      Physical Exam  BP 107/73   Pulse 108   Ht '5\' 7"'$  (1.702 m)   Wt 76.3 kg (168 lb 3.4 oz)   SpO2 97%   BMI 26.35 kg/m    General/Constitutional: Well developed, well nourished male, who looks at stated age of 61 year old. No acute distress.   Neurological: Alert and Oriented x 3. Cranial nerves II through XII are grossly intact. Full range of motion throughout. Normal base and station.     Psychiatric: Normal mental status, mood is euthymic    Assessment  Keith Cross is a 61 year old male with PMH of cerebral cavernoma and OSA who presents today for follow up of OSA currently on device therapy. He is consistent with trying PAP therapy and experiencing benefit from ongoing treatment, but continues to struggle with leak    #. Obstructive Sleep Apnea: baseline very severe with AHI 57.8/hr and O2 nadir 53%. He is benefiting from therapy as evidenced by residual AHI 5/hr on PAP and consistent with use as evidenced by PAP usage on 83% of days, but is in need of increased duration with usage and mask leak remains an issue     Plan  #. The following setting changes were made to your PAP device:   -Set heated tube temperature to 77F (21C)    #. Order placed for mask change to F&P Evora FFM size L   #. Mask fit interface were assessed today in clinic. Caution to avoid over-tightening mask before air flow has started   #. Continue to wear PAP device at all times when sleeping.  #.  Use distilled water only in your PAP device. Please contact [employer] for a replacement water camber   #. Follow up with NP in 4 months or sooner if needed. Contact clinic if any questions arise prior to the next visit.     Sincerely,  Harold Barban, NP      I have personally provided 25  minutes of clinical care time. Time includes review of therapy data, clarification of symptoms, troubleshooting issues, communication to arrange next steps and documentation of the above.

## 2022-05-02 NOTE — Patient Instructions (Addendum)
#.   Please contact [employer] to send you a new water chamber for your CPAP. Their phone number is: [phone]     #. Use distilled water only in your CPAP because tap water can cause build up     #. Be careful not to overtighten your mask until the air has started flowing     #. You can change the temperature of the air flow under My Options and Tube Temperature

## 2022-05-07 ENCOUNTER — Encounter: Payer: Self-pay | Admitting: Student in an Organized Health Care Education/Training Program

## 2022-05-07 ENCOUNTER — Ambulatory Visit
Payer: BLUE CROSS/BLUE SHIELD | Attending: Surgery | Admitting: Student in an Organized Health Care Education/Training Program

## 2022-05-07 VITALS — BP 156/89 | HR 83 | Temp 97.1°F | Resp 16 | Ht 67.0 in | Wt 169.8 lb

## 2022-05-07 DIAGNOSIS — E119 Type 2 diabetes mellitus without complications: Secondary | ICD-10-CM

## 2022-05-07 DIAGNOSIS — E1159 Type 2 diabetes mellitus with other circulatory complications: Secondary | ICD-10-CM | POA: Insufficient documentation

## 2022-05-07 DIAGNOSIS — Z6826 Body mass index (BMI) 26.0-26.9, adult: Secondary | ICD-10-CM | POA: Insufficient documentation

## 2022-05-07 LAB — HEMOGLOBIN A1C, POINT OF CARE TESTING: Hemoglobin A1c, POC: 7.4 % — ABNORMAL HIGH (ref 4.0–5.6)

## 2022-05-07 NOTE — Patient Instructions (Addendum)
Please call radiology at [phone] to schedule your thyroid biopsy  We will consider starting a once-weekly injectable medication for diabetes, such as Mounjaro, Ozempic, or Trulicity  Return to clinic in 6 weeks

## 2022-05-07 NOTE — Progress Notes (Signed)
ENDOCRINOLOGY OUTPATIENT VISIT - [facility]    Date and Time of Evaluation:  Wednesday May 07, 2022    Primary Care Physician:  [facility]     Chief Complaint:   No chief complaint on file.      History of Present Illness:    Keith Cross is a 61 year old male with CVA 2015, HTN, HLD, and T2DM, here for evaluation and management of adrenal nodule.    1. Adrenal nodule.    Subcentimeter adrenal nodule (0.9 cm) on 10/2020 CTAP with 28 HU and absolute/relative contrast washout of 74%/42%, c/w benign adrenal adenoma.     Was seen by Dr. Inetta Fermo of Endocrine Surgery given history of uncontrolled hypertension and previous hormonal w/up with elevated plasma metanephrines 62 (< 57) and normetanephrines 252 (< 148), though less than 2x ULN. Aldosterone level was normal at 8. Repeat labs with Dr. Ronny Bacon with unremarkable plasma metanephrines (normetanephrines very minimally elevated at 0.98, normal < 0.9). 24-hour urine metanephrines were wnl. Aldosterone/renin wnl with normal dexamethasone suppression test.      Currently on multiple hypertensive agents, including clonidine, carvedilol, hydralazine, isosorbide mononitrate, amlodipine, losartan, metoprolol, and furosemide. BP 181/108 in clinic today.     Patient does endorse headaches in the morning and late evening. He does not have a Neurologist. Also endorses episodes of palpitations, diaphoresis, SOB, and chest pain. Advised patient he needs to present to ER if he has these symptoms. Has Cardiology referral. Reports diarrhea with mucus in stool once every few weeks.     Also had thyroid US, read pending.      05/07/2022 follow up:    Visit completed with [name] interpretor ([employer])     Thyroid US with right 1.3 cm TR 5 nodule. Amenable to biopsy.     BP improving now that he has started CPAP for OSA.     Still having intermittent chest pain. Following with cardiology.     Following with neurology for cerebral cavernoma.     Past Medical/Surgical  History:  Past Medical History:   Diagnosis Date   . History of CVA (cerebrovascular accident)    . Hyperlipidemia    . Hypertension    . Type 2 diabetes mellitus (CMS-HCC)        Family History:  No family history on file.    Social History:  Social History     Socioeconomic History   . Marital status: Married   Tobacco Use   . Smoking status: Never   . Smokeless tobacco: Never   Substance and Sexual Activity   . Alcohol use: Not Currently       Allergies:  No Known Allergies    Medications:  Current Outpatient Medications on File Prior to Visit   Medication Sig Dispense Refill   . allopurinol (ZYLOPRIM) 300 MG tablet Take 1 tablet (300 mg) by mouth daily.     Marland Kitchen amLODIPINE (NORVASC) 10 MG tablet Take 1 tablet (10 mg) by mouth daily. 90 tablet 3   . aspirin 81 MG EC tablet Take 1 tablet (81 mg) by mouth daily. 90 tablet 3   . atorvastatin (LIPITOR) 80 MG tablet Take 1 tablet (80 mg) by mouth daily. 90 tablet 3   . Blood Glucose Monitoring Suppl (TRUE METRIX METER) w/Device KIT As Directed.     . carvedilol (COREG) 25 MG tablet Take 1 tablet (25 mg) by mouth 2 times daily (with meals). 180 tablet 3   . empagliflozin (JARDIANCE)  25 mg tablet Take 1 tablet (25 mg) by mouth daily. 90 tablet 3   . furosemide (LASIX) 40 MG tablet TAKE 1 TABLET BY MOUTH EVERY DAY IN THE MORNING     . galcanezumab-gnlm (EMGALITY) 120 mg/mL injection pen Inject 1 mL (120 mg) under the skin every 30 days. 1 mL 11   . hydrochlorothiazide (HYDRODIURIL) 25 MG tablet Take 1 tablet (25 mg) by mouth daily. 90 tablet 3   . isosorbide mononitrate (IMDUR) 60 MG Controlled-Release tablet      . losartan (COZAAR) 100 MG tablet Take 1 tablet (100 mg) by mouth daily.     . meclizine (ANTIVERT) 25 MG tablet      . metFORMIN (GLUCOPHAGE) 1000 mg tablet Take 1 tablet (1,000 mg) by mouth 2 times daily.     . pioglitazone (ACTOS) 30 mg tablet Take 1 tablet (30 mg) by mouth.     . potassium chloride (KLOR-CON M20) 20 MEQ tablet TAKE 1 TABLET DAILY IN THE  MORNING     . rimegepant (NURTEC ODT) tablet Dissolve 1 tablet (75 mg) on or under the tongue daily as needed (to treat migraines). Do not take more than one tablet per day. 16 tablet 11   . TRUE METRIX BLOOD GLUCOSE TEST test strip CHECK BLOOD SUGAR ONCE A DAY     . TRUEplus Lancets 30G MISC CHECK BLOOD SUGAR ONCE A DAY     . venlafaxine (EFFEXOR) 37.5 MG tablet Take 1 tablet (37.5 mg) by mouth 2 times daily. 60 tablet 4     Current Facility-Administered Medications on File Prior to Visit   Medication Dose Route Frequency Provider Last Rate Last Admin   . nitroGLYcerin (NITROLINGUAL) spray 1 spray  1 spray Sublingual Q5 Min PRN Paul Half, MD   3 spray at 03/27/21 1439       Physical Exam:  Vital Signs: There were no vitals taken for this visit.    General:  No acute distress  HEENT: No proptosis, lid lag, or periorbital edema. No scleral icterus   Lungs: Symmetric chest rise, normal work of breathing  Heart: Regular rate  Extremities: No clubbing, cyanosis, or edema  Neurological: Alerted and oriented x 4  Skin:  No visible lesions  Psych: Appropriate mood and affect    Diagnostic Data:    Laboratory Data:    Lab Results   Component Value Date    HGB 15.0 03/28/2021    HCT 46.0 03/28/2021    PLT 195 03/28/2021    RBC 5.34 03/28/2021    MCV 86.2 03/28/2021    RDW 14.5 (H) 03/28/2021    NEUTP 55.0 03/28/2021    NEUTAB 3.1 03/28/2021    EOSAB 0.3 03/28/2021       Lab Results   Component Value Date/Time    SODIUM 135 (L) 01/29/2022 02:17 PM    K 3.9 01/29/2022 02:17 PM    CL 101 01/29/2022 02:17 PM    CO2 26 01/29/2022 02:17 PM    BUN 20 01/29/2022 02:17 PM    CREAT 1.2 01/29/2022 02:17 PM    GLU 126 (H) 01/29/2022 02:17 PM    [place] 9.2 01/29/2022 02:17 PM    MG 2.0 03/28/2021 08:28 AM    PHOS 3.9 03/28/2021 08:28 AM    TPROT 7.2 01/29/2022 02:17 PM    ALB 4.0 (L) 01/29/2022 02:17 PM    ALK 46 01/29/2022 02:17 PM    TBILI 1.0 01/29/2022 02:17 PM    AST 22 01/29/2022  02:17 PM    ALT 33 01/29/2022 02:17 PM       Lab  Results   Component Value Date/Time    A1C 7.3 (H) 01/29/2022 01:32 PM    A1C 7.2 (H) 03/27/2021 04:06 PM       Lab Results   Component Value Date/Time    TSHHS 1.303 03/27/2021 04:06 PM    FREET4 0.92 03/27/2021 04:06 PM     03/2022 Thyroid US    IMPRESSION:    1.  Indeterminate 1.3 cm TR 5 nodule in the right thyroid lobe. Recommend ultrasound-guided fine-needle aspiration for further evaluation.    Impression:  Keith Cross is a 61 year old male with CVA 2015, HTN, HLD, and T2DM, here for evaluation and management of adrenal nodule.    # Thyroid nodule  - 1.3 cm right TR 5 nodule   - Will obtain FNA     #Adrenal nodule  - 0.9 cm with negative hormonal workup (normal aldosterone/renin, DST, and minimally elevated plasma normetanephrines <2x ULN) in 12/2020 with Dr. Inetta Fermo  - Repeat aldo/renin unremarkable, with minimally elevated plasma normetanephrines again in 01/2021; has not completed LNSC yet   - Subcentimeter adrenal nodules do not typically require surveillance, however given nearly 1 cm size will consider repeat CT scan. If stable does not require regular monitoring.      # T2DM   - A1c 7.4%, goal < 8% without hypoglycemia given hx of CAD  - MTF 1g BID  - Jardiance 25 mg daily   - Actos 30 mg  - At goal, but if BG can be lowered without hypoglycemia it would be reasonable to consider an additional agent. We had a very extensive discussion with the translator re his PMH; no apparent hx of MTC/MEN, but initially endorsed pancreatitis, which he later clarified was a renal/GU issues, likely nephrolithiasis and possibly BPH. He would like to do some additional research into GLP-1RA before our next visit     #HTN  - Refractory despite multiple antihypertensives  - Negative workup for hyperaldosteronism and pheochromocytoma.   - Improving on CPAP  - Following with cardiology    RTC after FNA     Future Appointments   Date Time Provider [place]   05/15/2022 11:00 AM Elesa Massed, NP [facility]  [place]   06/18/2022 11:30 AM Marialuisa Basara, Betsey Holiday, MD [place] [id number] [place]   06/23/2022  1:30 PM Paul Half, MD [place] [id number] [place]   08/08/2022  1:30 PM Serafina Mitchell, NP [place] [facility]   10/16/2022 11:45 AM Simonne Come, MD  [id number] Tamala Fothergill, MD     I personally spent >45 total minutes in face-to-face and non-face-to-face activities related to the patient's visit today, excluding interpretation services and any separately reportable services/procedures

## 2022-05-12 ENCOUNTER — Encounter: Payer: Self-pay | Admitting: Student in an Organized Health Care Education/Training Program

## 2022-05-12 DIAGNOSIS — E1159 Type 2 diabetes mellitus with other circulatory complications: Secondary | ICD-10-CM

## 2022-05-12 DIAGNOSIS — E041 Nontoxic single thyroid nodule: Secondary | ICD-10-CM

## 2022-05-14 NOTE — Progress Notes (Addendum)
Keith Cross, Nurse Practitioner  [address]   [place], [place] [address]  P: [phone] F: [phone]    Date of Evaluation: 05/15/2022  Referring Physician: Self, Referred  Primary Care Physician: [facility]    Chief Complaint: headache    History of Present Illness:  Keith Cross is a 61 year old, 64 male with OSA, HTN (uncontrolled despite medication) who presented for follow up regarding headaches.     Patient reports headache and chest pain, pointing to left chest wall. States chest pain was present upon waking, worse when he takes a deep breath. When questioned about arm numbness, stated his entire left arm is numb, starting in left shoulder. BP elevated, normal O2 sat on room air.     EMS called and transported patient to the hospital.      ADDENDUM: per patient's daughter, patient admitted to [facility] on 7/6, d/c'd 7/11 and is now in acute rehab. Diagnosed with hemiplegic migraine.     Failed meds: metoprolol, nortriptyline, cannot take triptans or NSAIDs due to history of stroke, venlafaxine, rimegepant    Past Medical History:  Patient Active Problem List   Diagnosis   . Chest pain   . Abnormal stress echo   . OSA (obstructive sleep apnea)   . Cerebral cavernoma       Past Surgical History:   Past Surgical History:   Procedure Laterality Date   . Left knee surgery         Social History  Social History     Socioeconomic History   . Marital status: Married     Spouse name: Not on file   . Number of children: Not on file   . Years of education: Not on file   . Highest education level: Not on file   Occupational History   . Not on file   Tobacco Use   . Smoking status: Never   . Smokeless tobacco: Never   Substance and Sexual Activity   . Alcohol use: Not Currently   . Drug use: Not on file   . Sexual activity: Not on file   Other Topics Concern   . Not on file   Social History Narrative   . Not on file     Social Determinants of Health     Financial Resource Strain: Not on  file   Food Insecurity: Not on file   Transportation Needs: Not on file   Physical Activity: Not on file   Stress: Not on file   Social Connections: Not on file   Intimate Partner Violence: Not on file   Housing Stability: Not on file       Family History:  Mother-stroke  Father-cancer    Home Medications:  Outpatient Medications Marked as Taking for the 05/15/22 encounter (Office Visit) with Elesa Massed, NP   Medication Sig Dispense Refill   . allopurinol (ZYLOPRIM) 300 MG tablet Take 1 tablet (300 mg) by mouth daily.     Marland Kitchen amLODIPINE (NORVASC) 10 MG tablet Take 1 tablet (10 mg) by mouth daily. 90 tablet 3   . aspirin 81 MG EC tablet Take 1 tablet (81 mg) by mouth daily. 90 tablet 3   . atorvastatin (LIPITOR) 80 MG tablet Take 1 tablet (80 mg) by mouth daily. 90 tablet 3   . Blood Glucose Monitoring Suppl (TRUE METRIX METER) w/Device KIT As Directed.     . carvedilol (COREG) 25 MG tablet Take 1 tablet (25  mg) by mouth 2 times daily (with meals). 180 tablet 3   . empagliflozin (JARDIANCE) 25 mg tablet Take 1 tablet (25 mg) by mouth daily. 90 tablet 3   . furosemide (LASIX) 40 MG tablet TAKE 1 TABLET BY MOUTH EVERY DAY IN THE MORNING     . galcanezumab-gnlm (EMGALITY) 120 mg/mL injection pen Inject 1 mL (120 mg) under the skin every 30 days. 1 mL 11   . hydrochlorothiazide (HYDRODIURIL) 25 MG tablet Take 1 tablet (25 mg) by mouth daily. 90 tablet 3   . isosorbide mononitrate (IMDUR) 60 MG Controlled-Release tablet      . losartan (COZAAR) 100 MG tablet Take 1 tablet (100 mg) by mouth daily.     . meclizine (ANTIVERT) 25 MG tablet      . metFORMIN (GLUCOPHAGE) 1000 mg tablet Take 1 tablet (1,000 mg) by mouth 2 times daily.     . pioglitazone (ACTOS) 30 mg tablet Take 1 tablet (30 mg) by mouth.     . potassium chloride (KLOR-CON M20) 20 MEQ tablet TAKE 1 TABLET DAILY IN THE MORNING     . rimegepant (NURTEC ODT) tablet Dissolve 1 tablet (75 mg) on or under the tongue daily as needed (to treat migraines). Do not take more  than one tablet per day. 16 tablet 11   . TRUE METRIX BLOOD GLUCOSE TEST test strip CHECK BLOOD SUGAR ONCE A DAY     . TRUEplus Lancets 30G MISC CHECK BLOOD SUGAR ONCE A DAY     . venlafaxine (EFFEXOR) 37.5 MG tablet Take 1 tablet (37.5 mg) by mouth 2 times daily. 60 tablet 4     Current Facility-Administered Medications for the 05/15/22 encounter (Office Visit) with Elesa Massed, NP   Medication Dose Route Frequency Provider Last Rate Last Admin   . nitroGLYcerin (NITROLINGUAL) spray 1 spray  1 spray Sublingual Q5 Min PRN Paul Half, MD   3 spray at 03/27/21 1439       Allergies:   No Known Allergies     Review of Systems: 12 point Review of Systems is negative other than what is noted above.      Physical Exam:  BP 136/86 (BP Location: Left arm, BP Patient Position: Sitting, BP cuff size: Large)   Pulse 93   Temp 98.6 F (37 C) (Temporal)   Resp 16   Ht '5\' 7"'$  (1.702 m)   Wt 78.5 kg (173 lb 1 oz)   SpO2 98%   BMI 27.11 kg/m   General Exam:   Head, Eyes, Ears, Nose, Throat: Normocephalic, atraumatic.   Neck: Supple   Extremities: Warm and well-perfused, no clubbing, cyanosis, or edema.   Neurologic Exam:   Mental Status: Awake and alert, oriented x 3. Speech is fluent and prosodic.   Cranial Nerves:   Cranial Nerve 2: Pupils equal, round, reactive to light. Visual Fields full to confrontation bilaterally.   Cranial Nerves 3, 4, 6: Extraocular movements are intact with no nystagmus.   Cranial Nerve 5: Facial sensation intact to light touch in V1, V2, V3 bilaterally.   Cranial Nerve 7: Facial power intact bilaterally to tight eye closure, eye brow lift, cheek puff. Asymmetry noted with smile.  Cranial Nerve 8: Hearing is intact to finger rub bilaterally.   Cranial Nerves 9, 10: Palate elevates, uvula midline.   Cranial Nerve 11: Shoulder shrug and head turn are 5/5 on right, 4/5 on left  Cranial Nerve 12: Tongue is midline.   Extra pyramidal: No rigidity,  bradykinesia, or resting tremor  Motor: Normal bulk and  tone.   Sensory: Intact to light touch in all four limbs.   Gait: Negative Romberg.     Labs:   Albumin   Date Value Ref Range Status   01/29/2022 4.0 (L) 4.2 - 5.5 G/DL Final     ALT   Date Value Ref Range Status   01/29/2022 33 7 - 52 U/L Final     AST   Date Value Ref Range Status   01/29/2022 22 13 - 39 U/L Final     BUN   Date Value Ref Range Status   01/29/2022 20 7 - 25 mg/dL Final     Calcium   Date Value Ref Range Status   01/29/2022 9.2 8.6 - 10.3 mg/dL Final     Chloride   Date Value Ref Range Status   01/29/2022 101 98 - 107 mmol/L Final     Cholesterol   Date Value Ref Range Status   06/19/2021 97 <200 MG/DL Final     Comment:     Desirable: <200 mg/dL  Borderline High: 200-239 mg/dL  High: >240 mg/dL       CO2   Date Value Ref Range Status   01/29/2022 26 21 - 31 mmol/L Final     Creat   Date Value Ref Range Status   01/29/2022 1.2 0.7 - 1.3 mg/dL Final     eGFR - low estimate   Date Value Ref Range Status   01/29/2022 >60 >59 Final     eGFR - high estimate   Date Value Ref Range Status   01/29/2022 >60 >59 Final     Comment:     (Unit: mL/min/1.73 sq mtr)  The calculated GFR is an estimate and is NOT an accurate reflection of GFR in   patients on dialysis. The accuracy of estimated GFR is also affected by muscle   mass, and medications that affect renal tubular secretion of creatinine.  If estimated GFR will directly affect clinical decision making, consider using cystatin C to estimate GFR, and/or consult with nephrology. eGFR was calculated using the MDRD equation (2006).       Glucose   Date Value Ref Range Status   01/29/2022 126 (H) 85 - 125 mg/dL Final     Comment:        Normal Fasting Glucose:  <100 mg/dL  Impaired Fasting Glucose: 100-125 mg/dL  Provisional DX of diabetes(must be confirmed) >125 mg/dL       Hgb   Date Value Ref Range Status   03/28/2021 15.0 13.5 - 16.9 G/DL Final     Hemoglobin A1c, POC   Date Value Ref Range Status   05/07/2022 7.4 (H) 4.0 - 5.6 % Final     Comment:      Target goal for most diabetics is <=7%     Magnesium   Date Value Ref Range Status   03/28/2021 2.0 1.9 - 2.7 mg/dL Final     Phosphorus   Date Value Ref Range Status   03/28/2021 3.9 2.5 - 5.0 MG/DL Final     PLT Count   Date Value Ref Range Status   03/28/2021 195 150 - 400 THOUS/MCL Final     Potassium   Date Value Ref Range Status   01/29/2022 3.9 3.5 - 5.1 mmol/L Final     Sodium   Date Value Ref Range Status   01/29/2022 135 (L) 136 - 145 mmol/L Final  Triglycerides   Date Value Ref Range Status   06/19/2021 98 <150 MG/DL Final     Comment:     Normal: <150 mg/dL  Borderline High: 150-199 mg/dL  High: 200-499 mg/dL  Very high: > or = 500 mg/dL       No results found for: COLORUA, APPEARUA, [place], BILIUA, KETONEUA, SGUA, BLOODUA, PHUA, PROTEINUA, UROBILUA, NITRITEUA, [place], WBCUA, RBCUA, EPITHCELLSUA, HYALINEUA, CRYSTALSUA, [place]    Radiology:  MRI/MRA brain (05/30/2021)  MRI Brain  Mass effect: There is no midline shift, herniation, or hydrocephalus.  Brain parenchyma: There is no acute infarction. There is no acute intracranial hemorrhage. There is a subcentimeter probable cavernoma in the right temporal lobe. There is a chronic microhemorrhage in the right basal ganglia. There is a nonspecific punctate focus of susceptibility effect in the left posterior medulla. There are scattered white matter T2/FLAIR-hyperintensities, which are nonspecific and may be related to chronic microvascular ischemic change.  Vascular structures: The major vascular flow voids are present.  Bony and soft tissue structures: There is trace fluid in the mastoid air cells. There is trace paranasal sinus mucosal thickening. The osseous structures are unremarkable. The surrounding soft tissue structures are unremarkable.    MRA head:  Anterior circulation: There is no significant stenosis, proximal occlusion, or aneurysm of bilateral intracranial ICAs, bilateral ACAs, and bilateral MCAs.  Posterior circulation: There is  no significant stenosis, proximal occlusion, or aneurysm of bilateral V4 segments, basilar artery, visualized cerebellar arteries, and bilateral PCAs.  IMPRESSION:  1.  Presumed cavernoma in the right temporal lobe. Nonspecific punctate focus of susceptibility in the left posterior medulla with no evidence of acute hemorrhage.   2.  No aneurysm, significant stenosis or proximal occlusion intracranially.    Assessment and plan:  Mr. Crum is a 61 year old, [name] speaking man with headaches upon waking since 2013. The timing coincides with when patient stopped using CPAP. Cannot rule out migraine given associated symptoms but suspect headaches are related to OSA. Discussed impacts of untreated OSA on cardiovascular health and as likely underlying cause of headaches.     Patient endorsed left chest pain and left arm numbness upon waking this morning. EMS called, transported patient to hospital.    Approximately 20 minutes was spent by me in the direct clinical care of this patient.  Over 50% of this time was spent in the process of counseling and coordination of care, to include face-to-face time with the patient and/or family members present.      Thank you again for allowing me to participate in the care of your patient.

## 2022-05-15 ENCOUNTER — Ambulatory Visit: Payer: BLUE CROSS/BLUE SHIELD | Attending: Nurse Practitioner | Admitting: Nurse Practitioner

## 2022-05-15 ENCOUNTER — Encounter: Payer: Self-pay | Admitting: Student in an Organized Health Care Education/Training Program

## 2022-05-15 ENCOUNTER — Inpatient Hospital Stay: Admit: 2022-05-15 | Discharge: 2022-05-15 | Disposition: A | Discharge status: Home Health | Payer: Self-pay

## 2022-05-15 ENCOUNTER — Encounter: Payer: Self-pay | Admitting: Nurse Practitioner

## 2022-05-15 VITALS — BP 171/110 | HR 92 | Temp 98.6°F | Resp 16 | Ht 67.0 in | Wt 173.1 lb

## 2022-05-15 DIAGNOSIS — R079 Chest pain, unspecified: Secondary | ICD-10-CM | POA: Insufficient documentation

## 2022-05-15 DIAGNOSIS — G43409 Hemiplegic migraine, not intractable, without status migrainosus: Secondary | ICD-10-CM

## 2022-05-15 DIAGNOSIS — G4733 Obstructive sleep apnea (adult) (pediatric): Secondary | ICD-10-CM | POA: Insufficient documentation

## 2022-05-15 DIAGNOSIS — D1800 Hemangioma unspecified site: Secondary | ICD-10-CM | POA: Insufficient documentation

## 2022-05-15 DIAGNOSIS — G43009 Migraine without aura, not intractable, without status migrainosus: Secondary | ICD-10-CM | POA: Insufficient documentation

## 2022-05-15 DIAGNOSIS — Z8673 Personal history of transient ischemic attack (TIA), and cerebral infarction without residual deficits: Secondary | ICD-10-CM | POA: Insufficient documentation

## 2022-05-15 DIAGNOSIS — Z6827 Body mass index (BMI) 27.0-27.9, adult: Secondary | ICD-10-CM

## 2022-05-16 ENCOUNTER — Inpatient Hospital Stay: Admit: 2022-05-16 | Discharge: 2022-05-16 | Disposition: A | Discharge status: Home Health | Payer: Self-pay

## 2022-05-17 ENCOUNTER — Inpatient Hospital Stay: Admit: 2022-05-17 | Discharge: 2022-05-17 | Disposition: A | Discharge status: Home Health | Payer: Self-pay

## 2022-05-19 ENCOUNTER — Inpatient Hospital Stay: Admit: 2022-05-19 | Discharge: 2022-05-19 | Disposition: A | Discharge status: Home Health | Payer: Self-pay

## 2022-05-21 ENCOUNTER — Encounter: Payer: Self-pay | Admitting: Nurse Practitioner

## 2022-05-22 ENCOUNTER — Inpatient Hospital Stay: Admit: 2022-05-22 | Discharge: 2022-05-22 | Disposition: A | Discharge status: Home Health | Payer: Self-pay

## 2022-05-23 ENCOUNTER — Encounter: Payer: Self-pay | Admitting: Nurse Practitioner

## 2022-05-23 ENCOUNTER — Encounter: Payer: Self-pay | Admitting: Student in an Organized Health Care Education/Training Program

## 2022-05-23 NOTE — Progress Notes (Signed)
Completed FMLA paperwork and faxed to [employer] absence management.

## 2022-05-26 ENCOUNTER — Encounter: Payer: Self-pay | Admitting: Nurse Practitioner

## 2022-05-27 ENCOUNTER — Encounter: Payer: Self-pay | Admitting: Nurse Practitioner

## 2022-05-27 ENCOUNTER — Ambulatory Visit: Payer: BLUE CROSS/BLUE SHIELD | Attending: Nurse Practitioner | Admitting: Nurse Practitioner

## 2022-05-27 VITALS — BP 123/87 | HR 94 | Temp 98.1°F | Resp 16 | Ht 67.0 in | Wt 155.4 lb

## 2022-05-27 DIAGNOSIS — R519 Headache, unspecified: Secondary | ICD-10-CM | POA: Insufficient documentation

## 2022-05-27 DIAGNOSIS — G43409 Hemiplegic migraine, not intractable, without status migrainosus: Secondary | ICD-10-CM | POA: Insufficient documentation

## 2022-05-27 DIAGNOSIS — Z6824 Body mass index (BMI) 24.0-24.9, adult: Secondary | ICD-10-CM | POA: Insufficient documentation

## 2022-05-27 DIAGNOSIS — R531 Weakness: Secondary | ICD-10-CM | POA: Insufficient documentation

## 2022-05-27 DIAGNOSIS — G43009 Migraine without aura, not intractable, without status migrainosus: Secondary | ICD-10-CM | POA: Insufficient documentation

## 2022-05-27 DIAGNOSIS — G4733 Obstructive sleep apnea (adult) (pediatric): Secondary | ICD-10-CM | POA: Insufficient documentation

## 2022-05-27 DIAGNOSIS — Z8673 Personal history of transient ischemic attack (TIA), and cerebral infarction without residual deficits: Secondary | ICD-10-CM | POA: Insufficient documentation

## 2022-05-27 DIAGNOSIS — D1800 Hemangioma unspecified site: Secondary | ICD-10-CM | POA: Insufficient documentation

## 2022-05-27 MED ORDER — COLCHICINE 0.6 MG OR TABS
ORAL_TABLET | ORAL | Status: AC
Start: 2022-03-01 — End: ?

## 2022-05-27 MED ORDER — ASPIRIN 81 MG OR TBEC
1.0000 | DELAYED_RELEASE_TABLET | Freq: Every day | ORAL | Status: DC
Start: 2022-03-01 — End: 2022-05-27

## 2022-05-27 MED ORDER — ALLOPURINOL 300 MG OR TABS
1.0000 | ORAL_TABLET | Freq: Every day | ORAL | Status: DC
Start: 2022-03-01 — End: 2022-05-27

## 2022-05-27 MED ORDER — PANTOPRAZOLE SODIUM 40 MG OR TBEC
40.0000 mg | DELAYED_RELEASE_TABLET | Freq: Every day | ORAL | Status: AC
Start: 2022-05-24 — End: ?

## 2022-05-27 MED ORDER — NITROGLYCERIN 0.1 MG/HR TD PT24
MEDICATED_PATCH | TRANSDERMAL | Status: DC
Start: 2022-05-26 — End: 2023-04-23

## 2022-05-27 MED ORDER — CLONIDINE HCL 0.2 MG OR TABS
0.2000 mg | ORAL_TABLET | Freq: Three times a day (TID) | ORAL | Status: DC
Start: 2022-03-01 — End: 2022-07-22

## 2022-05-27 MED ORDER — VERAPAMIL HCL SR (BID) 240 MG OR TBCR
EXTENDED_RELEASE_TABLET | ORAL | Status: DC
Start: 2022-05-26 — End: 2022-05-27

## 2022-05-27 MED ORDER — TOPIRAMATE 25 MG OR TABS
25.0000 mg | ORAL_TABLET | Freq: Two times a day (BID) | ORAL | 0 refills | Status: DC
Start: 2022-05-27 — End: 2022-05-27

## 2022-05-27 MED ORDER — TOPIRAMATE 50 MG OR TABS
50.0000 mg | ORAL_TABLET | Freq: Two times a day (BID) | ORAL | Status: AC
Start: 2022-05-24 — End: ?

## 2022-05-27 MED ORDER — ACETAMINOPHEN 325 MG PO TABS
650.0000 mg | ORAL_TABLET | ORAL | Status: AC
Start: 2022-05-24 — End: ?

## 2022-05-27 MED ORDER — VENLAFAXINE HCL 75 MG OR CP24
75.0000 mg | ORAL_CAPSULE | Freq: Every day | ORAL | 1 refills | Status: AC
Start: 2022-05-27 — End: ?

## 2022-05-27 MED ORDER — METOPROLOL SUCCINATE 50 MG OR TB24
ORAL_TABLET | ORAL | Status: DC
Start: 2022-05-17 — End: 2022-07-22

## 2022-05-27 MED ORDER — ONDANSETRON 4 MG OR TBDP
ORAL_TABLET | ORAL | Status: AC
Start: 2022-05-24 — End: ?

## 2022-05-27 MED ORDER — ALBUTEROL SULFATE 108 (90 BASE) MCG/ACT IN AERS
INHALATION_SPRAY | RESPIRATORY_TRACT | Status: AC
Start: 2022-05-04 — End: ?

## 2022-05-27 NOTE — Progress Notes (Signed)
Keith Cross, Nurse Practitioner  [facility]   [place], [place] [address]  P: [phone] F: [phone]    Date of Evaluation: 05/27/2022  Referring Physician: Self, Referred  Primary Care Physician: Glenford Peers Huu    Chief Complaint: headache    History of Present Illness:  Keith Cross is a 61 year old, 73 male with OSA, HTN (uncontrolled despite medication) seen for follow up regarding headaches. Visit was conducted using [place] interpreter [phone].     Headaches present 10+ years, baseline frequency=15-20 days/month.  Present upon waking and frequently wakes him from sleep during the night. Describes as pulsating. Would last all day if left untreated but is relieved with ASA-takes 2 43m tablets, which relieves pain for 2-3 hours, then headache returns.   At previous visit, started rimegepant ODT 772m1 tablet prn. Works after an hour, 5-6 hours later, pain returns. Takes 3-4 times/week with ASA.   Referred to sleep medicine by cardiologist. [place], was advised he stopped breathing for up to a minute, sometimes multiple times/hour.  [facility] showed severe OSA with O2 nadir of 53%. Has increased use of CPAP for 4 hours/night. Recent adjustments made to improve comfort. Given new mask to try x3 months.    Blood pressure has been labile-cardiology is working up patient for underlying causes.   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 Patient [facility] would be [facility]. Finding present on report from MRI brain with and without contrast from 2017. Referred to Neurosurgery, seen by Dr. GoNathaneil Canarywho recommended  regular monitoring but did not believe findings explained patient's headache symptoms. Scheduled for follow up on 10/16/22.     Interval History:  Patient was last seen on office on 7/6 for follow up. At that time, patient reported chest pain and LUE numbness. EMS was called [facility] then developed left sided weakness. MRI and CT angio showed no evidence of stroke or perfusion deficits. Given Decadron and topiramate. Weakness and headache persisted. Speech therapist noticed left facial droop, tongue deviation to the right. Was diagnosed with hemiplegic migraine, prescribed verapamil and topiramate, then discharged to acute rehab on 7/11.   Patient has since been discharged home but continues to have left arm and leg weakness, cannot walk without assistance. Authorization for physical therapy is pending. He states he is not taking verapamil or topiramate and that his headaches have persisted. He also reports feeling more irritable and is having difficulty sleeping.       Failed meds: metoprolol, nortriptyline, cannot take triptans or NSAIDs due to history of stroke, venlafaxine, rimegepant, venlafaxine    Past Medical History:  Patient Active Problem List   Diagnosis   . Chest pain   . Abnormal stress echo   . OSA (obstructive sleep apnea)   . Cerebral cavernoma       Past Surgical History:   Past Surgical History:   Procedure Laterality Date   . Left knee surgery         Social History  Social History     Socioeconomic History   . Marital status: Married  Spouse name: Not on file   . Number of children: Not on file   . Years of education: Not on file   . Highest education level: Not on file   Occupational History   . Not on file   Tobacco Use   . Smoking status: Never   . Smokeless tobacco: Never   Substance and Sexual Activity   . Alcohol use: Not Currently   . Drug use: Not on file   . Sexual activity: Not on file   Other Topics Concern   . Not on file   Social History Narrative    . Not on file     Social Determinants of Health     Financial Resource Strain: Not on file   Food Insecurity: Not on file   Transportation Needs: Not on file   Physical Activity: Not on file   Stress: Not on file   Social Connections: Not on file   Intimate Partner Violence: Not on file   Housing Stability: Not on file       Family History:  Mother-stroke  Father-cancer    Home Medications:  No outpatient medications have been marked as taking for the 05/27/22 encounter (Appointment) with Elesa Massed, NP.     Current Facility-Administered Medications for the 05/27/22 encounter (Appointment) with Elesa Massed, NP   Medication Dose Route Frequency Provider Last Rate Last Admin   . nitroGLYcerin (NITROLINGUAL) spray 1 spray  1 spray Sublingual Q5 Min PRN Paul Half, MD   3 spray at 03/27/21 1439       Allergies:   No Known Allergies     Review of Systems: 12 point Review of Systems is negative other than what is noted above.      Physical Exam:  BP 123/87 (BP Location: Left arm, BP Patient Position: Sitting, BP cuff size: Large)   Pulse 94   Temp 98.1 F (36.7 C) (Temporal)   Resp 16   Ht _0  (1.702 m)   Wt 70.5 kg (155 lb 6.8 oz)   BMI 24.34 kg/m   General Exam:   Head, Eyes, Ears, Nose, Throat: Normocephalic, atraumatic.   Neck: Supple   Extremities: Warm and well-perfused, no clubbing, cyanosis, or edema.   Neurologic Exam:   Mental Status: Awake and alert, oriented x 3. Speech is fluent and prosodic.   Cranial Nerves:   Cranial Nerve 2: Pupils equal, round, reactive to light. Visual Fields full to confrontation bilaterally.   Cranial Nerves 3, 4, 6: Extraocular movements are intact with no nystagmus.   Cranial Nerve 5: Facial sensation increased to light touch in V1, V2, V3 on right side.   Cranial Nerve 7: Facial power intact bilaterally to tight eye closure, eye brow lift, cheek puff. Asymmetry noted with smile.  Cranial Nerve 8: Hearing is intact to finger rub bilaterally.   Cranial Nerves 9, 10: Palate  elevates, uvula midline.   Cranial Nerve 11: Shoulder shrug and head turn are 5/5 on right, 3/5 on left  Cranial Nerve 12: Tongue deviates to right.   Extra pyramidal: No rigidity, bradykinesia, or resting tremor  Motor: Normal bulk and tone. Power is 5/5 right deltoids, biceps, triceps, wrist extension, wrist flexion, interosseous, grip, iliopsoas, hamstring, quadriceps, dorsiflexion, plantarflexion. 3/5 to left upper and lower extremities.  Sensory: Intact to light touch in all four limbs.   Reflexes: 2+ symmetric right biceps, brachioradialis, triceps, patellae, and ankle jerks. 3+ on left side.  Gait: Remained in wheelchair  Labs:   Albumin   Date Value Ref Range Status   01/29/2022 4.0 (L) 4.2 - 5.5 G/DL Final     ALT   Date Value Ref Range Status   01/29/2022 33 7 - 52 U/L Final     AST   Date Value Ref Range Status   01/29/2022 22 13 - 39 U/L Final     BUN   Date Value Ref Range Status   01/29/2022 20 7 - 25 mg/dL Final     Calcium   Date Value Ref Range Status   01/29/2022 9.2 8.6 - 10.3 mg/dL Final     Chloride   Date Value Ref Range Status   01/29/2022 101 98 - 107 mmol/L Final     Cholesterol   Date Value Ref Range Status   06/19/2021 97 <200 MG/DL Final     Comment:     Desirable: <200 mg/dL  Borderline High: 200-239 mg/dL  High: >240 mg/dL       CO2   Date Value Ref Range Status   01/29/2022 26 21 - 31 mmol/L Final     Creat   Date Value Ref Range Status   01/29/2022 1.2 0.7 - 1.3 mg/dL Final     eGFR - low estimate   Date Value Ref Range Status   01/29/2022 >60 >59 Final     eGFR - high estimate   Date Value Ref Range Status   01/29/2022 >60 >59 Final     Comment:     (Unit: mL/min/1.73 sq mtr)  The calculated GFR is an estimate and is NOT an accurate reflection of GFR in   patients on dialysis. The accuracy of estimated GFR is also affected by muscle   mass, and medications that affect renal tubular secretion of creatinine.  If estimated GFR will directly affect clinical decision making, consider  using cystatin C to estimate GFR, and/or consult with nephrology. eGFR was calculated using the MDRD equation (2006).       Glucose   Date Value Ref Range Status   01/29/2022 126 (H) 85 - 125 mg/dL Final     Comment:        Normal Fasting Glucose:  <100 mg/dL  Impaired Fasting Glucose: 100-125 mg/dL  Provisional DX of diabetes(must be confirmed) >125 mg/dL       Hgb   Date Value Ref Range Status   03/28/2021 15.0 13.5 - 16.9 G/DL Final     Hemoglobin A1c, POC   Date Value Ref Range Status   05/07/2022 7.4 (H) 4.0 - 5.6 % Final     Comment:     Target goal for most diabetics is <=7%     Magnesium   Date Value Ref Range Status   03/28/2021 2.0 1.9 - 2.7 mg/dL Final     Phosphorus   Date Value Ref Range Status   03/28/2021 3.9 2.5 - 5.0 MG/DL Final     PLT Count   Date Value Ref Range Status   03/28/2021 195 150 - 400 THOUS/MCL Final     Potassium   Date Value Ref Range Status   01/29/2022 3.9 3.5 - 5.1 mmol/L Final     Sodium   Date Value Ref Range Status   01/29/2022 135 (L) 136 - 145 mmol/L Final     Triglycerides   Date Value Ref Range Status   06/19/2021 98 <150 MG/DL Final     Comment:     Normal: <150 mg/dL  Borderline High: 150-199  mg/dL  High: 200-499 mg/dL  Very high: > or = 500 mg/dL       No results found for: COLORUA, APPEARUA, [place], BILIUA, KETONEUA, SGUA, BLOODUA, PHUA, PROTEINUA, [place], NITRITEUA, [place], WBCUA, RBCUA, EPITHCELLSUA, HYALINEUA, CRYSTALSUA, [place]    Radiology:  MRI/MRA brain (05/30/2021)  MRI Brain  Mass effect: There is no midline shift, herniation, or hydrocephalus.  Brain parenchyma: There is no acute infarction. There is no acute intracranial hemorrhage. There is a subcentimeter probable cavernoma in the right temporal lobe. There is a chronic microhemorrhage in the right basal ganglia. There is a nonspecific punctate focus of susceptibility effect in the left posterior medulla. There are scattered white matter T2/FLAIR-hyperintensities, which are nonspecific and may be  related to chronic microvascular ischemic change.  Vascular structures: The major vascular flow voids are present.  Bony and soft tissue structures: There is trace fluid in the mastoid air cells. There is trace paranasal sinus mucosal thickening. The osseous structures are unremarkable. The surrounding soft tissue structures are unremarkable.    MRA head:  Anterior circulation: There is no significant stenosis, proximal occlusion, or aneurysm of bilateral intracranial ICAs, bilateral ACAs, and bilateral MCAs.  Posterior circulation: There is no significant stenosis, proximal occlusion, or aneurysm of bilateral V4 segments, basilar artery, visualized cerebellar arteries, and bilateral PCAs.  IMPRESSION:  1.  Presumed cavernoma in the right temporal lobe. Nonspecific punctate focus of susceptibility in the left posterior medulla with no evidence of acute hemorrhage.   2.  No aneurysm, significant stenosis or proximal occlusion intracranially.    Assessment and plan:  Mr. Schirtzinger is a 61 year old, [name] speaking man with headaches upon waking for the past 10 years. The timing coincides with when patient stopped using CPAP. Cannot rule out migraine given associated symptoms but suspect headaches are related to OSA. Discussed impacts of untreated OSA on cardiovascular health and as likely underlying cause of headaches.   Significant left upper and lower extremity weakness, left-sided hyperreflexia and right tongue deviation noted on today's exam. Per recent ED report from [facility], imaging was negative for stroke and patient was diagnosed with hemiplegic migraine. However, given persistence of weakness and other exam findings, concern that recent event was due to stroke.     1. Follow up as scheduled with Sleep Medicine--continue to increase use of CPAP machine. Take melatonin 79m at bedtime.  2. Follow up as scheduled with Cardiology  3. Follow up as scheduled with neurosurgery for monitoring of cavernoma  4. Continue  rimegepant ODT 79monce daily as needed. Do not exceed 1 tablet per day.  5. Start venlafaxine ER 7546mnce daily for one month. If irritability persists, increase to 2 capsules per day.  6. Refer to Stroke specialist, Dr. ShaManuella Ghazi 06/06/22 at 3pm. Please make sure we have a copy of the CTA/MRI images before that visit. Head CT ordered.  7. Follow up 2 months    Approximately 40 minutes was spent by me in the direct clinical care of this patient.  Over 50% of this time was spent in the process of counseling and coordination of care, to include face-to-face time with the patient and/or family members present.      Thank you again for allowing me to participate in the care of your patient.

## 2022-05-27 NOTE — Patient Instructions (Signed)
Follow up as scheduled with Sleep Medicine--continue to increase use of CPAP machine. Take melatonin '10mg'$  at bedtime.  Follow up as scheduled with Cardiology  Follow up as scheduled with neurosurgery  Continue rimegepant ODT '75mg'$  once daily as needed. Do not exceed 1 tablet per day.  Start venlafaxine ER '75mg'$  once daily for one month. If irritability persists, increase to 2 capsules per day.  Refer to Stroke specialist, Dr. Manuella Ghazi on 06/06/22 at 3pm. Please make sure we have a copy of the CTA/MRI images before that visit.  Follow up 2 months

## 2022-05-29 ENCOUNTER — Other Ambulatory Visit: Payer: Self-pay | Admitting: Vascular Neurology

## 2022-06-02 ENCOUNTER — Encounter: Payer: Self-pay | Admitting: Student in an Organized Health Care Education/Training Program

## 2022-06-02 ENCOUNTER — Telehealth: Payer: Self-pay | Admitting: Nurse Practitioner

## 2022-06-02 NOTE — Telephone Encounter (Signed)
Keith Cross from [place] is requesting for clarification on medication that was prescribed for the patient and then cancelled.   Patient is also wanting to know.   Please call to assist.  Ok to leave a detailed msg   Thank you

## 2022-06-03 ENCOUNTER — Encounter: Payer: Self-pay | Admitting: Nurse Practitioner

## 2022-06-03 DIAGNOSIS — R531 Weakness: Secondary | ICD-10-CM

## 2022-06-04 NOTE — Telephone Encounter (Signed)
Called to confirm rx for venlafaxine XR '75mg'$  daily.

## 2022-06-05 ENCOUNTER — Ambulatory Visit
Admit: 2022-06-05 | Discharge: 2022-06-05 | Disposition: A | Discharge status: Home / Self Care | Payer: BLUE CROSS/BLUE SHIELD | Attending: Diagnostic Radiology | Admitting: Diagnostic Radiology

## 2022-06-05 DIAGNOSIS — R9082 White matter disease, unspecified: Secondary | ICD-10-CM | POA: Insufficient documentation

## 2022-06-05 DIAGNOSIS — D1802 Hemangioma of intracranial structures: Secondary | ICD-10-CM

## 2022-06-05 DIAGNOSIS — R531 Weakness: Secondary | ICD-10-CM | POA: Insufficient documentation

## 2022-06-06 ENCOUNTER — Ambulatory Visit: Payer: BLUE CROSS/BLUE SHIELD | Attending: Vascular Neurology | Admitting: Vascular Neurology

## 2022-06-06 ENCOUNTER — Encounter: Payer: Self-pay | Admitting: Vascular Neurology

## 2022-06-06 VITALS — BP 114/85 | HR 92 | Temp 98.0°F | Ht 67.0 in | Wt 158.5 lb

## 2022-06-06 DIAGNOSIS — Z6824 Body mass index (BMI) 24.0-24.9, adult: Secondary | ICD-10-CM

## 2022-06-06 DIAGNOSIS — F32A Depression, unspecified: Secondary | ICD-10-CM

## 2022-06-06 DIAGNOSIS — R531 Weakness: Secondary | ICD-10-CM | POA: Insufficient documentation

## 2022-06-08 ENCOUNTER — Encounter: Payer: Self-pay | Admitting: Nurse Practitioner

## 2022-06-09 ENCOUNTER — Telehealth: Payer: Self-pay | Admitting: Student in an Organized Health Care Education/Training Program

## 2022-06-09 ENCOUNTER — Telehealth: Payer: Self-pay | Admitting: Cardiovascular Disease

## 2022-06-09 NOTE — Consults (Signed)
---------------------(data below generated by Andria Meuse)------------------------    NEUROLOGY NOTE     Demographics:  Date: June 09, 2022   Patient Name: Keith Cross   Medical Record #: [id number]   DOB: 1961/01/11  Age: 61 year old  Sex: male    Requested by:      Jasper Loser D    Clinic Location:      [place]  [address]    Chief Complaint   Patient presents with   . New Patient      History of Present Illness:     Keith Cross is a 61 year old male who is here for evaluation of stroke.   He has history of migraines, hypertension, depression, CAD,  and reported stroke over 10 years ago.   He was admitted to [facility] from July 6th to 11th after being sent to the ED with right side headache and left side weakness. [facility] he underwent work-up with ct, ct angio, mri brain X 2 (4 days apart) and mri c-spine all of which were unrevealing and was discharged with diagnosis of hemiplegic migraine.     Since discharge, he continues to have headache and left side weakness but it is improving. He is able to work (works at [employer]) and do adl's. He reports history of stroke >10 years ago also affecting the left side and that left side has since been weaker although it became worse on July 6th.     He does feel stress makes weakness worse and that he is under lot of stress and worry at work.     Allergies:  No Known Allergies      Current Outpatient Medications:   .  acetaminophen (TYLENOL) 325 MG tablet, Take 2 tablets (650 mg) by mouth., Disp: , Rfl:   .  albuterol 108 (90 Base) MCG/ACT inhaler, TAKE 2 PUFFS EVERY 4 HOURS, Disp: , Rfl:   .  allopurinol (ZYLOPRIM) 300 MG tablet, Take 1 tablet (300 mg) by mouth daily., Disp: , Rfl:   .  amLODIPINE (NORVASC) 10 MG tablet, Take 1 tablet (10 mg) by mouth daily., Disp: 90 tablet, Rfl: 3  .  aspirin 81 MG EC tablet, Take 1 tablet (81 mg) by mouth daily., Disp: 90 tablet, Rfl: 3  .  atorvastatin (LIPITOR) 80 MG tablet, Take 1 tablet (80 mg) by mouth  daily., Disp: 90 tablet, Rfl: 3  .  Blood Glucose Monitoring Suppl (TRUE METRIX METER) w/Device KIT, As Directed., Disp: , Rfl:   .  carvedilol (COREG) 25 MG tablet, Take 1 tablet (25 mg) by mouth 2 times daily (with meals)., Disp: 180 tablet, Rfl: 3  .  cloNIDine (CATAPRES) 0.2 MG tablet, Take 1 tablet (0.2 mg) by mouth 3 times daily., Disp: , Rfl:   .  colchicine 0.6 MG tablet, TAKE 2 TABLETS NOW AND TAKE 1 TABLET 1 HOUR LATER FOR 1 DAY THEN TWICE A DAY, Disp: , Rfl:   .  empagliflozin (JARDIANCE) 25 mg tablet, Take 1 tablet (25 mg) by mouth daily., Disp: 90 tablet, Rfl: 3  .  furosemide (LASIX) 40 MG tablet, TAKE 1 TABLET BY MOUTH EVERY DAY IN THE MORNING, Disp: , Rfl:   .  galcanezumab-gnlm (EMGALITY) 120 mg/mL injection pen, Inject 1 mL (120 mg) under the skin every 30 days., Disp: 1 mL, Rfl: 11  .  hydrochlorothiazide (HYDRODIURIL) 25 MG tablet, Take 1 tablet (25 mg) by mouth daily., Disp: 90  tablet, Rfl: 3  .  isosorbide mononitrate (IMDUR) 60 MG Controlled-Release tablet, , Disp: , Rfl:   .  losartan (COZAAR) 100 MG tablet, Take 1 tablet (100 mg) by mouth daily., Disp: , Rfl:   .  meclizine (ANTIVERT) 25 MG tablet, , Disp: , Rfl:   .  metFORMIN (GLUCOPHAGE) 1000 mg tablet, Take 1 tablet (1,000 mg) by mouth 2 times daily., Disp: , Rfl:   .  metoprolol succinate (TOPROL XL) 50 MG XL tablet, , Disp: , Rfl:   .  nitroGLYcerin (NITRODUR) 0.1 MG/HR patch, , Disp: , Rfl:   .  ondansetron (ZOFRAN ODT) 4 MG disintegrating tablet, TAKE 1 TABLET BY MOUTH EVERY 6 HOURS AS NEEDED FOR NAUSEA OR VOMITING., Disp: , Rfl:   .  pantoprazole (PROTONIX) 40 MG tablet, Take 1 tablet (40 mg) by mouth daily., Disp: , Rfl:   .  pioglitazone (ACTOS) 30 mg tablet, Take 1 tablet (30 mg) by mouth., Disp: , Rfl:   .  potassium chloride (KLOR-CON M20) 20 MEQ tablet, TAKE 1 TABLET DAILY IN THE MORNING, Disp: , Rfl:   .  rimegepant (NURTEC ODT) tablet, Dissolve 1 tablet (75 mg) on or under the tongue daily as needed (to treat migraines). Do  not take more than one tablet per day., Disp: 16 tablet, Rfl: 11  .  topiramate (TOPAMAX) 50 MG tablet, Take 1 tablet (50 mg) by mouth 2 times daily., Disp: , Rfl:   .  TRUE METRIX BLOOD GLUCOSE TEST test strip, CHECK BLOOD SUGAR ONCE A DAY, Disp: , Rfl:   .  TRUEplus Lancets 30G MISC, CHECK BLOOD SUGAR ONCE A DAY, Disp: , Rfl:   .  venlafaxine XR (EFFEXOR XR) 75 MG capsule, Take 1 capsule (75 mg) by mouth daily., Disp: 90 capsule, Rfl: 1    Past Medical History:   Diagnosis Date   . History of CVA (cerebrovascular accident)    . Hyperlipidemia    . Hypertension    . Type 2 diabetes mellitus (CMS-HCC)        Past Surgical History:   Procedure Laterality Date   . Left knee surgery       Family History:    No family history on file.    Social History:     Exam:   On Exam today, I find:   Vital signs: Recorded in chart, and reviewed by me today.  Vitals:    06/06/22 1346   BP: 114/85   Pulse: 92   Temp: 98 F (36.7 C)   TempSrc: Temporal   Weight: 71.9 kg (158 lb 8.2 oz)   Height: '5\' 7"'$  (1.702 m)       <General Exam> General: Patient is well developed, & in no acute distress.     <Neurologic Exam> Mental status: Oriented to person/ place/ time. Memory 3/3 registration and 5 min delayed recall. Fluent speech.   CN: Cranial Nerves II-XII intact throughout: Cranial nerve II: Normal visual fields. PERRLA. Cranial nerve III,IV,VI: Extra-ocular movements intact. Cranial nerve V: decreased sensation to all modalities with mid-line splitting, including to vibration. Cranial nerve VII: initially normal smile but appears to "twist" face to the right giving impression of a left facial weakness. In conversation, did not appreciate facial weakness. Cranial nerve VIII: Normal hearing bilaterally. Cranial nerve IX,X: Palate elevates symmetrically. Cranial nerve XI: Normal shoulder shrug bilaterally. Cranial nerve XII: Tongue midline without deviation.   Motor: Normal bulk and tone in all 4 extremities. Antigravity strength in all  extremities with give-way weakness on the left. Testing at different times results in varying degree of effort.   Sensory: decreased touch on left side.   DTRs: reflex symmetrical. Toes downgoing. No hoffman.    Cereb:  No ataxia on finger to nose test.   Gait: normal arm swing, stride length.     Imaging: ct head - no hemorhage or acute/subacute strokes. Did not appreciate encephalomalacia     Impression/ Plan: Mr. Baik is a 61 year old man with history of migraines, self-reported history of stroke 10 years ago (has had at least mri of the brain since 2012 all of which have been negative for stroke) who has had now ~3 weeks of left side weakness with negative mri X 2, 4 days apart. Recent ct is also negative for an evolving infarct. Exam demonstrates non-physiologic features and I suspect there is likely a psychosomatic overlay. We discussed this and he does feel his stress level corresponds to his symptoms. He reports work being the source of his stress and does not have issues at home. He denies suicidal/homicideal ideations. He is on venlafaxine which helps but he is agreeable to see psychiatry. I also suggested therapy would be beneficial for him too. He will follow as needed in the stroke clinic.     Total time 40 minutes spent on chart/record review, exam, history, counseling, and documentation       Andria Meuse, MD   Assistant Clinical Professor   [facility]   Tel [phone]  Fax [phone]

## 2022-06-09 NOTE — Telephone Encounter (Signed)
*  Patient's wife is* requesting to speak to MD/Nurse regarding *rescheduleing appt*. She stated that ultrasound appt is scheduled same day as patient's follow up at 1:15pm. The first available is not until October.         Please assist, thank you

## 2022-06-09 NOTE — Telephone Encounter (Signed)
Patient's daughter Sonia Baller is calling requesting sooner appointment with Dr. Ronalee Red, states patient was hospitalized on 05/15/22 and would like pt to be seen sooner.     Reason for sooner appointment:  Hospital or ER follow up    Tentatively scheduled with Provider: Paul Half at Date/Time: 06/23/22 1:30 PM Location: [facility].    Please assist, thank you.

## 2022-06-09 NOTE — Telephone Encounter (Signed)
Attempted to call patient and no answer. LVM informing patient Dr. Ova Freshwater next available is until 08/13/2022. Left call back number for patient to give a call if they need to cancel or reschedule appointment.

## 2022-06-12 ENCOUNTER — Encounter: Payer: Self-pay | Admitting: Student in an Organized Health Care Education/Training Program

## 2022-06-12 NOTE — Telephone Encounter (Signed)
[place] Multiple Messages: 2nd call from Sonia Baller, please see previous message from Last Message Date: 06/09/22. iPhone utilized no.

## 2022-06-13 NOTE — Telephone Encounter (Signed)
Called and spoke to patient. Informed him that we no longer have availability 10/04. Next available is 10/25 at 4:30pm. Patient agreed to be rescheduled for that date and time.

## 2022-06-16 NOTE — Progress Notes (Signed)
[facility] Note    Patient: Geffrey Michaelsen  Patient DOB: 07/21/1961  Patient ID: [id number]  Patient PCP: Emmaline Kluver  Date of Service: 06/23/2022    Reason for Visit  Follow Up       HPI  Joh Rao is a 61 year old male    Interval History  Patients last seen 03/19/22    Interpreter ID: Elizebeth Koller [phone]    Patient was recently admitted to [facility] on 05/15/22 with complaints of headache and left sided weakness.  Stroke ruled out, diagnosed with hemiplegic migraine    Troponin negative x 2    Underwent echocardiogram 05/16/22 which showed normal EF 60-65%, normal wall motion, normal RV size/function, no significant valve issues    Noted to be very hypertensive, BP medications adjusted at hospital and now taking carvedilol 25 mg BID as well as nitro patch 0.1 mg/hr.  HCTZ, losartan, amlodipine discontinued.      Still having headache and chest pain.  Blood pressure still labile with values ranging from 29J systolic to 242A systolic    Problem List  Patient Active Problem List    Diagnosis Date Noted   . Cerebral cavernoma 07/04/2021   . OSA (obstructive sleep apnea) 04/17/2021   . Chest pain 03/27/2021   . Abnormal stress echo 03/27/2021        PMH  Past Medical History:   Diagnosis Date   . History of CVA (cerebrovascular accident)    . Hyperlipidemia    . Hypertension    . Type 2 diabetes mellitus (CMS-HCC)        Meds  Current Outpatient Medications   Medication Sig   . acetaminophen (TYLENOL) 325 MG tablet Take 2 tablets (650 mg) by mouth.   Marland Kitchen albuterol 108 (90 Base) MCG/ACT inhaler TAKE 2 PUFFS EVERY 4 HOURS   . allopurinol (ZYLOPRIM) 300 MG tablet Take 1 tablet (300 mg) by mouth daily.   Marland Kitchen amLODIPINE (NORVASC) 10 MG tablet Take 1 tablet (10 mg) by mouth daily.   Marland Kitchen aspirin 81 MG EC tablet Take 1 tablet (81 mg) by mouth daily.   Marland Kitchen atorvastatin (LIPITOR) 80 MG tablet Take 1 tablet (80 mg) by mouth daily.   . Blood Glucose Monitoring Suppl (TRUE METRIX METER) w/Device KIT As Directed.   . carvedilol (COREG) 25 MG  tablet Take 1 tablet (25 mg) by mouth 2 times daily (with meals).   . cloNIDine (CATAPRES) 0.2 MG tablet Take 1 tablet (0.2 mg) by mouth 3 times daily.   . colchicine 0.6 MG tablet TAKE 2 TABLETS NOW AND TAKE 1 TABLET 1 HOUR LATER FOR 1 DAY THEN TWICE A DAY   . empagliflozin (JARDIANCE) 25 mg tablet Take 1 tablet (25 mg) by mouth daily.   . furosemide (LASIX) 40 MG tablet TAKE 1 TABLET BY MOUTH EVERY DAY IN THE MORNING   . galcanezumab-gnlm (EMGALITY) 120 mg/mL injection pen Inject 1 mL (120 mg) under the skin every 30 days.   . hydrochlorothiazide (HYDRODIURIL) 25 MG tablet Take 1 tablet (25 mg) by mouth daily.   . isosorbide mononitrate (IMDUR) 60 MG Controlled-Release tablet    . losartan (COZAAR) 100 MG tablet Take 1 tablet (100 mg) by mouth daily.   . meclizine (ANTIVERT) 25 MG tablet    . metFORMIN (GLUCOPHAGE) 1000 mg tablet Take 1 tablet (1,000 mg) by mouth 2 times daily.   . metoprolol succinate (TOPROL XL) 50 MG XL tablet    . nitroGLYcerin (NITRODUR) 0.1  MG/HR patch    . ondansetron (ZOFRAN ODT) 4 MG disintegrating tablet TAKE 1 TABLET BY MOUTH EVERY 6 HOURS AS NEEDED FOR NAUSEA OR VOMITING.   . pantoprazole (PROTONIX) 40 MG tablet Take 1 tablet (40 mg) by mouth daily.   . pioglitazone (ACTOS) 30 mg tablet Take 1 tablet (30 mg) by mouth.   . potassium chloride (KLOR-CON M20) 20 MEQ tablet TAKE 1 TABLET DAILY IN THE MORNING   . rimegepant (NURTEC ODT) tablet Dissolve 1 tablet (75 mg) on or under the tongue daily as needed (to treat migraines). Do not take more than one tablet per day.   . topiramate (TOPAMAX) 50 MG tablet Take 1 tablet (50 mg) by mouth 2 times daily.   . TRUE METRIX BLOOD GLUCOSE TEST test strip CHECK BLOOD SUGAR ONCE A DAY   . TRUEplus Lancets 30G MISC CHECK BLOOD SUGAR ONCE A DAY   . venlafaxine XR (EFFEXOR XR) 75 MG capsule Take 1 capsule (75 mg) by mouth daily.     Current Facility-Administered Medications   Medication   . nitroGLYcerin (NITROLINGUAL) spray 1 spray       All  No Known  Allergies    ROS  A comprehensive review of 12 organ systems was performed and reviewed with the patient. Review of systems with the patient was negative except as indicated in the HPI above.    SH  Social History     Socioeconomic History   . Marital status: Married     Spouse name: Not on file   . Number of children: Not on file   . Years of education: Not on file   . Highest education level: Not on file   Occupational History   . Not on file   Tobacco Use   . Smoking status: Never   . Smokeless tobacco: Never   Substance and Sexual Activity   . Alcohol use: Not Currently   . Drug use: Not on file   . Sexual activity: Not on file   Other Topics Concern   . Not on file   Social History Narrative   . Not on file     Social Determinants of Health     Financial Resource Strain: Not on file   Food Insecurity: Not on file   Transportation Needs: Not on file   Physical Activity: Not on file   Stress: Not on file   Social Connections: Not on file   Intimate Partner Violence: Not on file   Housing Stability: Not on file     FH  No family history on file.    Past Surgical History:  No date: Left knee surgery     Physical Exam   06/23/22  1353 06/23/22  1355   BP: (!) 172/110 (!) 152/105   Pulse: 93 92   Temp: 97 F (36.1 C)    Resp: 17      Gen: pleasant, in NAD  Neuro: A&Ox3, no focal deficit  HEENT:  NC/AT. MMM  Neck: no bruit. JVD   CV: regular rate, normal rhythm, S1 S2   Resp: clear, no rales  Abd:  NABS soft ntnd  Ext: no c/c/e  Vasc: 2+ radial, DP pulses. Cap refill < 3 sec. Periphery warm    Labs  Lab Results   Component Value Date    RBC 5.34 03/28/2021    HGB 15.0 03/28/2021    HCT 46.0 03/28/2021    MCV 86.2 03/28/2021    MCHC 32.7  03/28/2021    RDW 14.5 (H) 03/28/2021    PLT 195 03/28/2021     Lab Results   Component Value Date    BUN 20 01/29/2022    CREAT 1.2 01/29/2022    CL 101 01/29/2022    K 3.9 01/29/2022    [place] 9.2 01/29/2022    TBILI 1.0 01/29/2022    ALB 4.0 (L) 01/29/2022    AST 22 01/29/2022    ALK  46 01/29/2022    ALT 33 01/29/2022    GLU 126 (H) 01/29/2022     Lab Results   Component Value Date    INR 0.95 03/27/2021    PTT 28.5 03/27/2021       Lab Results   Component Value Date    BNP 21 03/27/2021     Lab Results   Component Value Date    A1C 7.4 (H) 05/07/2022     Lab Results   Component Value Date    CHOL 97 06/19/2021    LDLCALC 33 06/19/2021    TRIG 98 06/19/2021       Other Diagnostics  Coronary angiogram:  [facility] on 03/27/2021:  CONCLUSIONS:   1. Mid LAD with 50% stenosis. RFR of mid LAD was 0.95. This was physiologically non-significant.   2. LVEDP 20-25 mm Hg.     RECOMMENDATIONS:   1. Maximize medical therapy.   2. Aggressive risk factor modification efforts.   3. Monitor vitals and arterial access site/pulses.    Echocardiogram 03/27/21:   1. Mildly decreased left ventricular systolic function. The left ventricular ejection fraction is 51% by Biplane.   2. Left ventricular ejection fraction is 53% by 3D.   3. Mild concentric left ventricular hypertrophy.   4. Normal LV filling pressures.   5. Normal right ventricular size and systolic function.    Exercise stress echocardiogram 03/27/21:   1. Positive exercise stress test for ischemia.   2. Patient developed chest pain at peak excercise that improved with recovery and rest.   3. Stage 1: 1: Mid and apical inferior wall is abnormal.      Stage 2: 2: Apical inferior segment is abnormal.   4. The peak estimated pulmonary artery systolic pressure during exercise was indeterminate.   5. Average functional capacity.    Assessment and plan:   Mohid Furuya is a 61 year old male    #Hypertension  -Suspect significant improvement due to initiating CPAP for sleep apnea  -Continue carvedilol 25 mg BID, nitroglycerin patch 0.1 mg/hr for now.  Add hydralazine 25 mg PRN for SBP>160-Prior secondary hypertension work-up negative for hyperaldosteronism and pheochromocytoma.  Normal creatinine.  His left arm blood pressure was actually higher than his right arm blood  pressure which does not suggest coarctation of the aorta.    -Bilateral renal artery stenosis unlikely with normal creatinine on ARB, no hemodynamically significant renal artery stenosis on duplex US  -Following with sleep medicine for treatment of severe OSA    #Coronary artery disease  -Moderate non obstructive disease found on coronary angiogram  -Aspirin 81 mg daily, carvedilol 25 mg BID, atorvastatin 80 mg daily  -He had abnormal exercise stress echocardiogram and was referred for coronary angiogram as above which showed non obstructive disease  -Consider microvascular disease?  Control blood pressure as above.  Nitroglycerin 0.1 mg/hr patch.  Will make BP adjustments    #Hyperlipidemia  -Goal LDL<70 mg/dL given CAD  -FLP 04/17/21: tc 167, hdl 47, ldL 93, TG 137  -FLP 06/19/21:  TC 97, HDL 44, LDL 33, TG 98  -FLP 05/15/22: TC 170, HDL 60, LDL 92, TG 91  -Atorvastatin 80 mg daily  -Add ezetimibe 10 mg daily    #Valve issue?  -Unclear issue with the valve.  TTE shows no significant valvular heart disease.  He has TTE in our system from 2016 showing aortic sclerosis with no other signficant valve disease    RTC in one month    I spent greater than 30 minutes on the day of the encounter reviewing the patient's diagnostic tests, performing a history and physical exam, writing orders, educating the patient, coordinating patient care with the staff, and documenting in the electronic medical records system.             ICD-10-CM ICD-9-CM    1. Benign hypertensive heart disease without heart failure  I11.9 402.10       2. Hyperlipidemia, unspecified hyperlipidemia type  E78.5 272.4       3. Coronary artery disease involving native coronary artery of native heart without angina pectoris  I25.10 414.01       4. Chest pain, unspecified type  R07.9 786.50            There are no Patient Instructions on file for this visit.    Paul Half, MD  Health Sciences Assistant Clinical Professor  Division of Cardiology  Department of  [place]

## 2022-06-18 ENCOUNTER — Ambulatory Visit: Payer: BLUE CROSS/BLUE SHIELD | Admitting: Student in an Organized Health Care Education/Training Program

## 2022-06-18 ENCOUNTER — Ambulatory Visit
Admit: 2022-06-18 | Discharge: 2022-06-18 | Disposition: A | Discharge status: Home / Self Care | Payer: BLUE CROSS/BLUE SHIELD | Attending: Nurse Practitioner | Admitting: Nurse Practitioner

## 2022-06-18 DIAGNOSIS — C73 Malignant neoplasm of thyroid gland: Secondary | ICD-10-CM | POA: Insufficient documentation

## 2022-06-18 DIAGNOSIS — E041 Nontoxic single thyroid nodule: Secondary | ICD-10-CM

## 2022-06-23 ENCOUNTER — Encounter: Payer: Self-pay | Admitting: Cardiovascular Disease

## 2022-06-23 ENCOUNTER — Ambulatory Visit: Payer: BLUE CROSS/BLUE SHIELD | Attending: Cardiovascular Disease | Admitting: Cardiovascular Disease

## 2022-06-23 VITALS — BP 152/105 | HR 92 | Temp 97.0°F | Resp 17 | Ht 67.0 in | Wt 157.7 lb

## 2022-06-23 DIAGNOSIS — I2510 Atherosclerotic heart disease of native coronary artery without angina pectoris: Secondary | ICD-10-CM | POA: Insufficient documentation

## 2022-06-23 DIAGNOSIS — Z6824 Body mass index (BMI) 24.0-24.9, adult: Secondary | ICD-10-CM | POA: Insufficient documentation

## 2022-06-23 DIAGNOSIS — E785 Hyperlipidemia, unspecified: Secondary | ICD-10-CM | POA: Insufficient documentation

## 2022-06-23 DIAGNOSIS — R079 Chest pain, unspecified: Secondary | ICD-10-CM | POA: Insufficient documentation

## 2022-06-23 DIAGNOSIS — I119 Hypertensive heart disease without heart failure: Secondary | ICD-10-CM | POA: Insufficient documentation

## 2022-06-23 MED ORDER — EZETIMIBE 10 MG OR TABS
10.0000 mg | ORAL_TABLET | Freq: Every day | ORAL | 3 refills | Status: AC
Start: 2022-06-23 — End: ?

## 2022-06-23 MED ORDER — HYDRALAZINE HCL 25 MG OR TABS
25.0000 mg | ORAL_TABLET | ORAL | 3 refills | Status: AC | PRN
Start: 2022-06-23 — End: ?

## 2022-06-24 LAB — CYTOLOGY FNA - ~~LOC~~

## 2022-07-16 ENCOUNTER — Telehealth: Payer: Self-pay | Admitting: Student in an Organized Health Care Education/Training Program

## 2022-07-16 NOTE — Telephone Encounter (Signed)
Entered in error

## 2022-07-17 ENCOUNTER — Telehealth: Payer: Self-pay | Admitting: Surgery

## 2022-07-17 ENCOUNTER — Telehealth: Payer: Self-pay

## 2022-07-17 ENCOUNTER — Ambulatory Visit: Payer: BLUE CROSS/BLUE SHIELD | Admitting: Student in an Organized Health Care Education/Training Program

## 2022-07-17 DIAGNOSIS — E1159 Type 2 diabetes mellitus with other circulatory complications: Secondary | ICD-10-CM

## 2022-07-17 DIAGNOSIS — C73 Malignant neoplasm of thyroid gland: Secondary | ICD-10-CM

## 2022-07-17 DIAGNOSIS — E041 Nontoxic single thyroid nodule: Secondary | ICD-10-CM

## 2022-07-17 NOTE — Telephone Encounter (Signed)
Called the patient via [name] interpreter [phone]) and left the VM regarding of scheduling with Dr Ronny Bacon for new patient consult. Provided call back number [phone] for any questions.  Tyla Burgner(Judy), RN

## 2022-07-17 NOTE — Telephone Encounter (Signed)
Patient is returning clinic's call. Transferred call to Bethena Roys with [name] interpreter ID# [id number].

## 2022-07-17 NOTE — Telephone Encounter (Signed)
Spoke with patient via [name] interpreter ID# [id number] and scheduled with Dr Ronny Bacon on 07/25/2022 @ 930 am. Patient verbalized understanding. Sent the address information through [place]. Provided call back number [phone] for any questions.  Pami Wool(Judy), RN

## 2022-07-17 NOTE — Progress Notes (Signed)
ENDOCRINOLOGY OUTPATIENT VISIT - [facility]    Date and Time of Evaluation:  Thursday July 17, 2022    Primary Care Physician:  [facility]     Chief Complaint:   No chief complaint on file.      History of Present Illness:    Keith Cross is a 61 year old male with CVA 2015, HTN, HLD, and T2DM, here for evaluation and management of adrenal nodule.    1. Adrenal nodule.    Subcentimeter adrenal nodule (0.9 cm) on 10/2020 CTAP with 28 HU and absolute/relative contrast washout of 74%/42%, c/w benign adrenal adenoma.     Was seen by Dr. Inetta Fermo of Endocrine Surgery given history of uncontrolled hypertension and previous hormonal w/up with elevated plasma metanephrines 62 (< 57) and normetanephrines 252 (< 148), though less than 2x ULN. Aldosterone level was normal at 8. Repeat labs with Dr. Ronny Bacon with unremarkable plasma metanephrines (normetanephrines very minimally elevated at 0.98, normal < 0.9). 24-hour urine metanephrines were wnl. Aldosterone/renin wnl with normal dexamethasone suppression test.      Currently on multiple hypertensive agents, including clonidine, carvedilol, hydralazine, isosorbide mononitrate, amlodipine, losartan, metoprolol, and furosemide. BP 181/108 in clinic today.     Patient does endorse headaches in the morning and late evening. He does not have a Neurologist. Also endorses episodes of palpitations, diaphoresis, SOB, and chest pain. Advised patient he needs to present to ER if he has these symptoms. Has Cardiology referral. Reports diarrhea with mucus in stool once every few weeks.     Also had thyroid US, read pending.      05/07/2022 follow up:    Visit completed with [name] interpretor ([employer])     Thyroid US with right 1.3 cm TR 5 nodule. Amenable to biopsy.     BP improving now that he has started CPAP for OSA.     Still having intermittent chest pain. Following with cardiology.     Following with neurology for cerebral cavernoma.     07/17/2022 follow  up:    Patient joined by daughter and wife on call.    Discussed that 06/2022 thyroid FNA c/w PTC and that patient will require surgery for treatment, which he is amenable to.      Past Medical/Surgical History:  Past Medical History:   Diagnosis Date   . History of CVA (cerebrovascular accident)    . Hyperlipidemia    . Hypertension    . Type 2 diabetes mellitus (CMS-HCC)        Family History:  No family history on file.    Social History:  Social History     Socioeconomic History   . Marital status: Married   Tobacco Use   . Smoking status: Never   . Smokeless tobacco: Never   Substance and Sexual Activity   . Alcohol use: Not Currently       Allergies:  No Known Allergies    Medications:  Current Outpatient Medications on File Prior to Visit   Medication Sig Dispense Refill   . acetaminophen (TYLENOL) 325 MG tablet Take 2 tablets (650 mg) by mouth.     Marland Kitchen albuterol 108 (90 Base) MCG/ACT inhaler TAKE 2 PUFFS EVERY 4 HOURS     . allopurinol (ZYLOPRIM) 300 MG tablet Take 1 tablet (300 mg) by mouth daily.     Marland Kitchen amLODIPINE (NORVASC) 10 MG tablet Take 1 tablet (10 mg) by mouth daily. 90 tablet 3   . aspirin 81 MG  EC tablet Take 1 tablet (81 mg) by mouth daily. 90 tablet 3   . atorvastatin (LIPITOR) 80 MG tablet Take 1 tablet (80 mg) by mouth daily. 90 tablet 3   . Blood Glucose Monitoring Suppl (TRUE METRIX METER) w/Device KIT As Directed.     . carvedilol (COREG) 25 MG tablet Take 1 tablet (25 mg) by mouth 2 times daily (with meals). 180 tablet 3   . cloNIDine (CATAPRES) 0.2 MG tablet Take 1 tablet (0.2 mg) by mouth 3 times daily.     . colchicine 0.6 MG tablet TAKE 2 TABLETS NOW AND TAKE 1 TABLET 1 HOUR LATER FOR 1 DAY THEN TWICE A DAY     . empagliflozin (JARDIANCE) 25 mg tablet Take 1 tablet (25 mg) by mouth daily. 90 tablet 3   . ezetimibe (ZETIA) 10 MG tablet Take 1 tablet (10 mg) by mouth daily. 90 tablet 3   . furosemide (LASIX) 40 MG tablet TAKE 1 TABLET BY MOUTH EVERY DAY IN THE MORNING     . galcanezumab-gnlm  (EMGALITY) 120 mg/mL injection pen Inject 1 mL (120 mg) under the skin every 30 days. 1 mL 11   . hydrALAZINE (APRESOLINE) 25 MG tablet Take 1 tablet (25 mg) by mouth as needed (Take if SBP>160, up to twice daily). 90 tablet 3   . hydrochlorothiazide (HYDRODIURIL) 25 MG tablet Take 1 tablet (25 mg) by mouth daily. 90 tablet 3   . isosorbide mononitrate (IMDUR) 60 MG Controlled-Release tablet      . losartan (COZAAR) 100 MG tablet Take 1 tablet (100 mg) by mouth daily.     . meclizine (ANTIVERT) 25 MG tablet      . metFORMIN (GLUCOPHAGE) 1000 mg tablet Take 1 tablet (1,000 mg) by mouth 2 times daily.     . metoprolol succinate (TOPROL XL) 50 MG XL tablet      . nitroGLYcerin (NITRODUR) 0.1 MG/HR patch      . ondansetron (ZOFRAN ODT) 4 MG disintegrating tablet TAKE 1 TABLET BY MOUTH EVERY 6 HOURS AS NEEDED FOR NAUSEA OR VOMITING.     . pantoprazole (PROTONIX) 40 MG tablet Take 1 tablet (40 mg) by mouth daily.     . pioglitazone (ACTOS) 30 mg tablet Take 1 tablet (30 mg) by mouth.     . potassium chloride (KLOR-CON M20) 20 MEQ tablet TAKE 1 TABLET DAILY IN THE MORNING     . rimegepant (NURTEC ODT) tablet Dissolve 1 tablet (75 mg) on or under the tongue daily as needed (to treat migraines). Do not take more than one tablet per day. 16 tablet 11   . topiramate (TOPAMAX) 50 MG tablet Take 1 tablet (50 mg) by mouth 2 times daily.     . TRUE METRIX BLOOD GLUCOSE TEST test strip CHECK BLOOD SUGAR ONCE A DAY     . TRUEplus Lancets 30G MISC CHECK BLOOD SUGAR ONCE A DAY     . venlafaxine XR (EFFEXOR XR) 75 MG capsule Take 1 capsule (75 mg) by mouth daily. 90 capsule 1     Current Facility-Administered Medications on File Prior to Visit   Medication Dose Route Frequency Provider Last Rate Last Admin   . nitroGLYcerin (NITROLINGUAL) spray 1 spray  1 spray Sublingual Q5 Min PRN Paul Half, MD   3 spray at 03/27/21 1439       Physical Exam:  Vital Signs: There were no vitals taken for this visit.    General:  No acute  distress  HEENT:  No proptosis, lid lag, or periorbital edema. No scleral icterus   Lungs: Symmetric chest rise, normal work of breathing  Neurological: Alerted and oriented x 4  Skin:  No visible lesions  Psych: Appropriate mood and affect    Diagnostic Data:    Laboratory Data:    Lab Results   Component Value Date    HGB 15.0 03/28/2021    HCT 46.0 03/28/2021    PLT 195 03/28/2021    RBC 5.34 03/28/2021    MCV 86.2 03/28/2021    RDW 14.5 (H) 03/28/2021    NEUTP 55.0 03/28/2021    NEUTAB 3.1 03/28/2021    EOSAB 0.3 03/28/2021       Lab Results   Component Value Date/Time    SODIUM 135 (L) 01/29/2022 02:17 PM    K 3.9 01/29/2022 02:17 PM    CL 101 01/29/2022 02:17 PM    CO2 26 01/29/2022 02:17 PM    BUN 20 01/29/2022 02:17 PM    CREAT 1.2 01/29/2022 02:17 PM    GLU 126 (H) 01/29/2022 02:17 PM    CA 9.2 01/29/2022 02:17 PM    MG 2.0 03/28/2021 08:28 AM    PHOS 3.9 03/28/2021 08:28 AM    TPROT 7.2 01/29/2022 02:17 PM    ALB 4.0 (L) 01/29/2022 02:17 PM    ALK 46 01/29/2022 02:17 PM    TBILI 1.0 01/29/2022 02:17 PM    AST 22 01/29/2022 02:17 PM    ALT 33 01/29/2022 02:17 PM       Lab Results   Component Value Date/Time    A1C 7.4 (H) 05/07/2022 01:52 PM    A1C 7.2 (H) 03/27/2021 04:06 PM       Lab Results   Component Value Date/Time    TSHHS 1.303 03/27/2021 04:06 PM    FREET4 0.92 03/27/2021 04:06 PM     03/2022 Thyroid US    IMPRESSION:     1.  Indeterminate 1.3 cm TR 5 nodule in the right thyroid lobe. Recommend ultrasound-guided fine-needle aspiration for further evaluation.    06/18/2022 Thyroid FNA        Impression:  Keith Cross is a 61 year old male with CVA 2015, HTN, HLD, and T2DM, here for evaluation and management of adrenal nodule.    # Papillary thyroid cancer  - 1.3 cm right TR 5 nodule s/p FNA c/w PTC in 06/2022  - We discussed that he will require lobectomy vs total thyroidectomy   - Will reach out to Dr. Ronny Bacon to schedule patient for follow up to discuss surgery     Not discussed during today's visit:      #Adrenal nodule  - 0.9 cm with negative hormonal workup (normal aldosterone/renin, DST, and minimally elevated plasma normetanephrines <2x ULN) in 12/2020 with Dr. Inetta Fermo  - Repeat aldo/renin unremarkable, with minimally elevated plasma normetanephrines again in 01/2021; has not completed LNSC yet   - Subcentimeter adrenal nodules do not typically require surveillance, however given nearly 1 cm size will consider repeat CT scan. If stable does not require regular monitoring.      # T2DM   - A1c 7.4%, goal < 8% without hypoglycemia given hx of CAD  - MTF 1g BID  - Jardiance 25 mg daily   - Actos 30 mg  - At goal, but if BG can be lowered without hypoglycemia it would be reasonable to consider an additional agent. We had a very extensive discussion with the translator re his PMH;  no apparent hx of MTC/MEN, but initially endorsed pancreatitis, which he later clarified was a renal/GU issues, likely nephrolithiasis and possibly BPH. He would like to do some additional research into GLP-1RA before our next visit     #HTN  - Refractory despite multiple antihypertensives  - Negative workup for hyperaldosteronism and pheochromocytoma.   - Improving on CPAP  - Following with cardiology    Future Appointments   Date Time Provider Rich   07/22/2022  3:00 PM Paul Half, MD [place] [id number] [place]   07/25/2022  9:30 AM Murriel Hopper, MD [place] [facility] [place]   07/28/2022 10:00 AM Elesa Massed, NP [facility] [place]   08/08/2022  1:30 PM Serafina Mitchell, NP [place] [facility]   09/03/2022  4:30 PM Betlachin, Betsey Holiday, MD [facility] [place]   10/16/2022 11:45 AM Simonne Come, MD  [id number]     Vernell Leep, MD     This telemedicine visit was conducted Audio+Video. The patient was in [place] for the duration of the visit.  The patient consented to a virtual visit, understanding the risks, such as a limited examination, and was aware that an in-person visit was available to them.  Total time spent was 21+ minutes.

## 2022-07-21 NOTE — Progress Notes (Unsigned)
[facility] Note    Patient: Keith Cross  Patient DOB: 02/14/1961  Patient ID: [id number]  Patient PCP: Emmaline Kluver  Date of Service: 07/22/2022    Reason for Visit  No chief complaint on file.       HPI  [name] is a 61 year old male    Interval History  Patients last seen 06/23/22    Interpreter ID: Elizebeth Koller [id number]    Patient was recently admitted to [facility] on 05/15/22 with complaints of headache and left sided weakness.  Stroke ruled out, diagnosed with hemiplegic migraine    Troponin negative x 2    Underwent echocardiogram 05/16/22 which showed normal EF 60-65%, normal wall motion, normal RV size/function, no significant valve issues    Noted to be very hypertensive, BP medications adjusted at hospital and now taking carvedilol 25 mg BID as well as nitro patch 0.1 mg/hr.  HCTZ, losartan, amlodipine discontinued.      Still having headache and chest pain.  Blood pressure still labile with values ranging from 58N systolic to 277O systolic    Problem List  Patient Active Problem List    Diagnosis Date Noted   . Cerebral cavernoma 07/04/2021   . OSA (obstructive sleep apnea) 04/17/2021   . Chest pain 03/27/2021   . Abnormal stress echo 03/27/2021        PMH  Past Medical History:   Diagnosis Date   . History of CVA (cerebrovascular accident)    . Hyperlipidemia    . Hypertension    . Type 2 diabetes mellitus (CMS-HCC)        Meds  Current Outpatient Medications   Medication Sig   . acetaminophen (TYLENOL) 325 MG tablet Take 2 tablets (650 mg) by mouth.   Marland Kitchen albuterol 108 (90 Base) MCG/ACT inhaler TAKE 2 PUFFS EVERY 4 HOURS   . allopurinol (ZYLOPRIM) 300 MG tablet Take 1 tablet (300 mg) by mouth daily.   Marland Kitchen amLODIPINE (NORVASC) 10 MG tablet Take 1 tablet (10 mg) by mouth daily.   Marland Kitchen aspirin 81 MG EC tablet Take 1 tablet (81 mg) by mouth daily.   Marland Kitchen atorvastatin (LIPITOR) 80 MG tablet Take 1 tablet (80 mg) by mouth daily.   . Blood Glucose Monitoring Suppl (TRUE METRIX METER) w/Device KIT As Directed.   .  carvedilol (COREG) 25 MG tablet Take 1 tablet (25 mg) by mouth 2 times daily (with meals).   . cloNIDine (CATAPRES) 0.2 MG tablet Take 1 tablet (0.2 mg) by mouth 3 times daily.   . colchicine 0.6 MG tablet TAKE 2 TABLETS NOW AND TAKE 1 TABLET 1 HOUR LATER FOR 1 DAY THEN TWICE A DAY   . empagliflozin (JARDIANCE) 25 mg tablet Take 1 tablet (25 mg) by mouth daily.   Marland Kitchen ezetimibe (ZETIA) 10 MG tablet Take 1 tablet (10 mg) by mouth daily.   . furosemide (LASIX) 40 MG tablet TAKE 1 TABLET BY MOUTH EVERY DAY IN THE MORNING   . galcanezumab-gnlm (EMGALITY) 120 mg/mL injection pen Inject 1 mL (120 mg) under the skin every 30 days.   . hydrALAZINE (APRESOLINE) 25 MG tablet Take 1 tablet (25 mg) by mouth as needed (Take if SBP>160, up to twice daily).   . hydrochlorothiazide (HYDRODIURIL) 25 MG tablet Take 1 tablet (25 mg) by mouth daily.   . isosorbide mononitrate (IMDUR) 60 MG Controlled-Release tablet    . losartan (COZAAR) 100 MG tablet Take 1 tablet (100 mg) by mouth daily.   Marland Kitchen  meclizine (ANTIVERT) 25 MG tablet    . metFORMIN (GLUCOPHAGE) 1000 mg tablet Take 1 tablet (1,000 mg) by mouth 2 times daily.   . metoprolol succinate (TOPROL XL) 50 MG XL tablet    . nitroGLYcerin (NITRODUR) 0.1 MG/HR patch    . ondansetron (ZOFRAN ODT) 4 MG disintegrating tablet TAKE 1 TABLET BY MOUTH EVERY 6 HOURS AS NEEDED FOR NAUSEA OR VOMITING.   . pantoprazole (PROTONIX) 40 MG tablet Take 1 tablet (40 mg) by mouth daily.   . pioglitazone (ACTOS) 30 mg tablet Take 1 tablet (30 mg) by mouth.   . potassium chloride (KLOR-CON M20) 20 MEQ tablet TAKE 1 TABLET DAILY IN THE MORNING   . rimegepant (NURTEC ODT) tablet Dissolve 1 tablet (75 mg) on or under the tongue daily as needed (to treat migraines). Do not take more than one tablet per day.   . topiramate (TOPAMAX) 50 MG tablet Take 1 tablet (50 mg) by mouth 2 times daily.   . TRUE METRIX BLOOD GLUCOSE TEST test strip CHECK BLOOD SUGAR ONCE A DAY   . TRUEplus Lancets 30G MISC CHECK BLOOD SUGAR  ONCE A DAY   . venlafaxine XR (EFFEXOR XR) 75 MG capsule Take 1 capsule (75 mg) by mouth daily.     Current Facility-Administered Medications   Medication   . nitroGLYcerin (NITROLINGUAL) spray 1 spray       All  No Known Allergies    ROS  A comprehensive review of 12 organ systems was performed and reviewed with the patient. Review of systems with the patient was negative except as indicated in the HPI above.    SH  Social History     Socioeconomic History   . Marital status: Married     Spouse name: Not on file   . Number of children: Not on file   . Years of education: Not on file   . Highest education level: Not on file   Occupational History   . Not on file   Tobacco Use   . Smoking status: Never   . Smokeless tobacco: Never   Substance and Sexual Activity   . Alcohol use: Not Currently   . Drug use: Not on file   . Sexual activity: Not on file   Other Topics Concern   . Not on file   Social History Narrative   . Not on file     Social Determinants of Health     Financial Resource Strain: Not on file   Food Insecurity: Not on file   Transportation Needs: Not on file   Physical Activity: Not on file   Stress: Not on file   Social Connections: Not on file   Intimate Partner Violence: Not on file   Housing Stability: Not on file     FH  No family history on file.    Past Surgical History:  No date: Left knee surgery     Physical Exam  There were no vitals filed for this visit.  Gen: pleasant, in NAD  Neuro: A&Ox3, no focal deficit  HEENT:  NC/AT. MMM  Neck: no bruit. JVD   CV: regular rate, normal rhythm, S1 S2   Resp: clear, no rales  Abd:  NABS soft ntnd  Ext: no c/c/e  Vasc: 2+ radial, DP pulses. Cap refill < 3 sec. Periphery warm    Labs  Lab Results   Component Value Date    RBC 5.34 03/28/2021    HGB 15.0 03/28/2021  HCT 46.0 03/28/2021    MCV 86.2 03/28/2021    MCHC 32.7 03/28/2021    RDW 14.5 (H) 03/28/2021    PLT 195 03/28/2021     Lab Results   Component Value Date    BUN 20 01/29/2022    CREAT 1.2  01/29/2022    CL 101 01/29/2022    K 3.9 01/29/2022    [place] 9.2 01/29/2022    TBILI 1.0 01/29/2022    ALB 4.0 (L) 01/29/2022    AST 22 01/29/2022    ALK 46 01/29/2022    ALT 33 01/29/2022    GLU 126 (H) 01/29/2022     Lab Results   Component Value Date    INR 0.95 03/27/2021    PTT 28.5 03/27/2021       Lab Results   Component Value Date    BNP 21 03/27/2021     Lab Results   Component Value Date    A1C 7.4 (H) 05/07/2022     Lab Results   Component Value Date    CHOL 97 06/19/2021    LDLCALC 33 06/19/2021    TRIG 98 06/19/2021       Other Diagnostics  Coronary angiogram:  L[facility] on 03/27/2021:  CONCLUSIONS:   1. Mid LAD with 50% stenosis. RFR of mid LAD was 0.95. This was physiologically non-significant.   2. LVEDP 20-25 mm Hg.     RECOMMENDATIONS:   1. Maximize medical therapy.   2. Aggressive risk factor modification efforts.   3. Monitor vitals and arterial access site/pulses.    Echocardiogram 03/27/21:   1. Mildly decreased left ventricular systolic function. The left ventricular ejection fraction is 51% by Biplane.   2. Left ventricular ejection fraction is 53% by 3D.   3. Mild concentric left ventricular hypertrophy.   4. Normal LV filling pressures.   5. Normal right ventricular size and systolic function.    Exercise stress echocardiogram 03/27/21:   1. Positive exercise stress test for ischemia.   2. Patient developed chest pain at peak excercise that improved with recovery and rest.   3. Stage 1: 1: Mid and apical inferior wall is abnormal.      Stage 2: 2: Apical inferior segment is abnormal.   4. The peak estimated pulmonary artery systolic pressure during exercise was indeterminate.   5. Average functional capacity.    Assessment and plan:   Donatello Kleve is a 61 year old male    #Hypertension  -Suspect significant improvement due to initiating CPAP for sleep apnea  -Continue carvedilol 25 mg BID, nitroglycerin patch 0.1 mg/hr for now.  Add hydralazine 25 mg PRN for SBP>160-Prior secondary hypertension work-up  negative for hyperaldosteronism and pheochromocytoma.  Normal creatinine.  His left arm blood pressure was actually higher than his right arm blood pressure which does not suggest coarctation of the aorta.    -Bilateral renal artery stenosis unlikely with normal creatinine on ARB, no hemodynamically significant renal artery stenosis on duplex US  -Following with sleep medicine for treatment of severe OSA    #Coronary artery disease  -Moderate non obstructive disease found on coronary angiogram  -Aspirin 81 mg daily, carvedilol 25 mg BID, atorvastatin 80 mg daily  -He had abnormal exercise stress echocardiogram and was referred for coronary angiogram as above which showed non obstructive disease  -Consider microvascular disease?  Control blood pressure as above.  Nitroglycerin 0.1 mg/hr patch.  Will make BP adjustments    #Hyperlipidemia  -Goal LDL<70 mg/dL given CAD  -  FLP 04/17/21: tc 167, hdl 47, ldL 93, TG 137  -FLP 06/19/21: TC 97, HDL 44, LDL 33, TG 98  -FLP 05/15/22: TC 170, HDL 60, LDL 92, TG 91  -Atorvastatin 80 mg daily  -Add ezetimibe 10 mg daily    #Valve issue?  -Unclear issue with the valve.  TTE shows no significant valvular heart disease.  He has TTE in our system from 2016 showing aortic sclerosis with no other signficant valve disease    RTC in one month    I spent greater than 30 minutes on the day of the encounter reviewing the patient's diagnostic tests, performing a history and physical exam, writing orders, educating the patient, coordinating patient care with the staff, and documenting in the electronic medical records system.             ICD-10-CM ICD-9-CM    1. Benign hypertensive heart disease without heart failure  I11.9 402.10       2. Hyperlipidemia, unspecified hyperlipidemia type  E78.5 272.4       3. Chest pain, unspecified type  R07.9 786.50       4. Coronary artery disease involving native coronary artery of native heart without angina pectoris  I25.10 414.01       5. Aortic valve  disorder  I35.9 424.1            There are no Patient Instructions on file for this visit.    Paul Half, MD  Health Sciences Assistant Clinical Professor  Division of Cardiology  Department of [place]

## 2022-07-22 ENCOUNTER — Ambulatory Visit: Payer: BLUE CROSS/BLUE SHIELD | Attending: Cardiovascular Disease | Admitting: Cardiovascular Disease

## 2022-07-22 VITALS — BP 111/75 | HR 85 | Wt 163.1 lb

## 2022-07-22 DIAGNOSIS — R079 Chest pain, unspecified: Secondary | ICD-10-CM | POA: Insufficient documentation

## 2022-07-22 DIAGNOSIS — I119 Hypertensive heart disease without heart failure: Secondary | ICD-10-CM | POA: Insufficient documentation

## 2022-07-22 DIAGNOSIS — Z6825 Body mass index (BMI) 25.0-25.9, adult: Secondary | ICD-10-CM | POA: Insufficient documentation

## 2022-07-22 DIAGNOSIS — I2510 Atherosclerotic heart disease of native coronary artery without angina pectoris: Secondary | ICD-10-CM | POA: Insufficient documentation

## 2022-07-22 DIAGNOSIS — E785 Hyperlipidemia, unspecified: Secondary | ICD-10-CM | POA: Insufficient documentation

## 2022-07-22 DIAGNOSIS — I359 Nonrheumatic aortic valve disorder, unspecified: Secondary | ICD-10-CM | POA: Insufficient documentation

## 2022-07-22 MED ORDER — AMLODIPINE 10 MG OR TABS
10.0000 mg | ORAL_TABLET | Freq: Every day | ORAL | 3 refills | Status: AC
Start: 2022-07-22 — End: ?

## 2022-07-24 NOTE — Progress Notes (Signed)
Endocrine Surgery    New Patient Consultation      Patient Name: Keith Cross   MRN: [id number]  Date of Evaluation: 07/25/2022   Referring Provider: Dr. Nadara Mode [facility])  Subjective    Reason For Visit: Papillary thyroid carcinoma  Chief Complaint: Papillary thyroid carcinoma    [name] Interpretor [phone]      History of Present Illness:  Keith Cross is a 61 year old male with PMH significant for CVA 2015, CAD, hypertension, hyperlipidemia, Type 2 DM, OSA on CPAP.    Previously evaluated by me on 12/21/20 and 01/23/21 for a 0.9 left adrenal nodule with imaging features c/w benign adenoma. Biochemical workup was negative.    Presents for evaluation of a thyroid nodule. He had a reported history of a thyroid nodule diagnosed when he lived in [place] Thyroid US from 01/20/21 with a 1.1 cm right thyroid nodule, TIRADS 4. He established care with Dr. Kathleene Hazel and surveillance US performed in 1 year. Thyroid US on 03/06/22 demonstrated a 1.3 cm right thyroid nodule, TIRADS 5. Subsequent FNA of right thyroid nodule on 06/18/22 c/w papillary thyroid carcinoma.    Does state he has some dysphagia. Denies pressure/discomfort in the neck. No voice changes.    No known family history of thyroid cancer. No history of head/neck XRT.    Currently off ASA.    Review of Systems:  A complete 14 point review of systems is negative other than as stated in the HPI    Past Medical History:   Past Medical History:   Diagnosis Date    History of CVA (cerebrovascular accident)     Hyperlipidemia     Hypertension     Type 2 diabetes mellitus (CMS-HCC)        Past Surgical History:   Past Surgical History:   Procedure Laterality Date    Left knee surgery         Adverse Drugs Reactions: No Known Allergies    Home Medications:  Current Outpatient Medications   Medication Sig    acetaminophen (TYLENOL) 325 MG tablet Take 2 tablets (650 mg) by mouth.    albuterol 108 (90 Base) MCG/ACT inhaler TAKE 2 PUFFS EVERY 4 HOURS     allopurinol (ZYLOPRIM) 300 MG tablet Take 1 tablet (300 mg) by mouth daily.    amLODIPINE (NORVASC) 10 MG tablet Take 1 tablet (10 mg) by mouth daily.    aspirin 81 MG EC tablet Take 1 tablet (81 mg) by mouth daily.    atorvastatin (LIPITOR) 80 MG tablet Take 1 tablet (80 mg) by mouth daily.    Blood Glucose Monitoring Suppl (TRUE METRIX METER) w/Device KIT As Directed.    carvedilol (COREG) 25 MG tablet Take 1 tablet (25 mg) by mouth 2 times daily (with meals).    colchicine 0.6 MG tablet TAKE 2 TABLETS NOW AND TAKE 1 TABLET 1 HOUR LATER FOR 1 DAY THEN TWICE A DAY    empagliflozin (JARDIANCE) 25 mg tablet Take 1 tablet (25 mg) by mouth daily.    ezetimibe (ZETIA) 10 MG tablet Take 1 tablet (10 mg) by mouth daily.    furosemide (LASIX) 40 MG tablet TAKE 1 TABLET BY MOUTH EVERY DAY IN THE MORNING    galcanezumab-gnlm (EMGALITY) 120 mg/mL injection pen Inject 1 mL (120 mg) under the skin every 30 days.    hydrALAZINE (APRESOLINE) 25 MG tablet Take 1 tablet (25 mg) by mouth as needed (Take if SBP>160, up to twice daily).  meclizine (ANTIVERT) 25 MG tablet     metFORMIN (GLUCOPHAGE) 1000 mg tablet Take 1 tablet (1,000 mg) by mouth 2 times daily.    nitroGLYcerin (NITRODUR) 0.1 MG/HR patch     ondansetron (ZOFRAN ODT) 4 MG disintegrating tablet TAKE 1 TABLET BY MOUTH EVERY 6 HOURS AS NEEDED FOR NAUSEA OR VOMITING.    pantoprazole (PROTONIX) 40 MG tablet Take 1 tablet (40 mg) by mouth daily.    pioglitazone (ACTOS) 30 mg tablet Take 1 tablet (30 mg) by mouth.    potassium chloride (KLOR-CON M20) 20 MEQ tablet TAKE 1 TABLET DAILY IN THE MORNING    rimegepant (NURTEC ODT) tablet Dissolve 1 tablet (75 mg) on or under the tongue daily as needed (to treat migraines). Do not take more than one tablet per day.    topiramate (TOPAMAX) 50 MG tablet Take 1 tablet (50 mg) by mouth 2 times daily.    TRUE METRIX BLOOD GLUCOSE TEST test strip CHECK BLOOD SUGAR ONCE A DAY    TRUEplus Lancets 30G MISC CHECK BLOOD SUGAR ONCE A DAY     venlafaxine XR (EFFEXOR XR) 75 MG capsule Take 1 capsule (75 mg) by mouth daily.     Current Facility-Administered Medications   Medication    nitroGLYcerin (NITROLINGUAL) spray 1 spray       Social History:  - ETOH: denies   - Tobacco use: denies  - Recreational drug use: denies     Family History:  - No known immediate family history of thyroid cancer    Objective   VITAL SIGNS:  BP (!) 150/92 (BP Location: Left arm, BP Patient Position: Sitting, BP cuff size: Regular)   Pulse 64   Temp 96.1 F (35.6 C) (Tympanic)   Resp 18   Ht '5\' 7"'$  (1.702 m)   Wt 74.8 kg (164 lb 14.5 oz)   BMI 25.83 kg/m   Body mass index is 25.83 kg/m.    Physical Exam   GEN: no apparent distress, responds appropriately, voice clear  HEENT: mucous membranes moist, no scleral icterus  NECK: no palpable masses or lymphadenopathy  RESPIRATORY: No increased work of breathing, regular rate  CV: Normal rate, regular rhythm  ABDOMEN: Soft, non-distended, nontender to palpation without rebound tenderness or guarding.  MSK: Warm, no lower extremity edema. UE/LE distal pulses intact bilaterally   SKIN: No rashes, ecchymosis, or skin lesions  NEURO: Alert and oriented to person, place, time, and events.   PSYCH: Normal affect and mood    RELEVANT DIAGNOSTICS  LAB RESULTS     WBC   HGB   PLT      HCT          Na 135* (03/22) CL 101 (03/22) BUN 20 (03/22) GLU   126* (03/22)   K 3.9 (03/22) CO2 26 (03/22) Cr 1.2 (03/22)      TP 7.2 (03/22) AST 22 (03/22) TBILI 1.0 (03/22) ALK PHOS  46 (03/22)   ALB 4.0* (03/22) ALT 33 (03/22) DBILI        PT   PTT     INR       A1c 7.4* (06/28)  TSH   FT4         IMAGING     THYROID/NECK SONOGRAM 03/06/2022     FINDINGS:     Thyroid isthmus: 0.3 cm in thickness  Right thyroid lobe: 4.6 x 1.8 x 2.5 cm  Left thyroid lobe: 4 x 1.7 x 2 cm     Background thyroid  parenchyma: Homogeneous.     Thyroid nodules: Key nodule(s) of interest as described below. Additional scattered smaller and/or benign appearing nodules.      Nodule 1, image #10:  Maximum size: 1.3 cm; Location: right mid gland   Composition: Solid or almost completely solid (2 points)  Echogenicity: Hypoechoic (2 points).   Shape: Wider than tall (0 points).   Margins: Lobulated or irregular (2 points).   Echogenic Foci: Macrocalcification (1 point)  Significant change in size (>/= 20% in two dimensions and minimal increase of 2 mm): no   ACR TI-RADS category: TR5 (= or > than 7 points).     No morphologically abnormal or enlarged cervical chain lymph nodes.      IMPRESSION:     1.  Indeterminate 1.3 cm TR 5 nodule in the right thyroid lobe. Recommend ultrasound-guided fine-needle aspiration for further evaluation.      PATHOLOGY     Cytology, Fine Needle Aspiration                  Case: [id number]                                   Authorizing Provider:  Vernell Leep, MD      Collected:           06/18/2022 01:25 PM           Ordering Location:     [facility]             Received:            06/18/2022 03:38 PM           Pathologist:           Erlene Quan, MD                                                            Final Diagnosis   A. Thyroid, Right Lobe, US-guided Fine Needle Aspiration:  SPECIMEN ADEQUACY:  Satisfactory for evaluation.  DIAGNOSTIC INTERPRETATION:  Malignant cells present. Papillary thyroid carcinoma.       Other labs and imaging reviewed. Pertinents mentioned in assessment/plan  Assessment/Plan:   In summary, Ryshawn Sanzone is a 61 year old male with PMH significant for CVA 2015, CAD, hypertension, hyperlipidemia, Type 2 DM, OSA on CPAP.    Previously evaluated by me on 12/21/20 and 01/23/21 for a 0.9 left adrenal nodule with imaging features c/w benign adenoma. Biochemical workup was negative.    Presents for evaluation of a thyroid nodule. He had a reported history of a thyroid nodule diagnosed when he lived in [place] Thyroid US from 01/20/21 with a 1.1 cm right thyroid nodule, TIRADS 4. He established care with Dr. Kathleene Hazel and  surveillance US performed in 1 year. Thyroid US on 03/06/22 demonstrated a 1.3 cm right thyroid nodule, TIRADS 5. Subsequent FNA of right thyroid nodule on 06/18/22 c/w papillary thyroid carcinoma.    Currently off ASA.      PLAN  - After discussion with the patient, I have recommended a right thyroid lobectomy, possible central neck dissection. Details of the procedure, along with associated risks, benefits, and alternatives were discussed. Risks discussed include bleeding, infection, and recurrent laryngeal  nerve injury resulting in temporary or permanent voice changes. The approximately 30% risk for requiring lifelong thyroid hormone supplementation was also discussed. All questions were answered. We will plan to schedule surgery at our earliest mutual convenience.    - Cardiac clearance reviewed from Dr. Ronalee Red  - Check preop labs, EKG, CXR   ____________________  Inetta Fermo, MD, FACS  Assistant Clinical Professor of Surgery  [facility]

## 2022-07-25 ENCOUNTER — Encounter: Payer: Self-pay | Admitting: Surgery

## 2022-07-25 ENCOUNTER — Other Ambulatory Visit
Admission: RE | Admit: 2022-07-25 | Discharge: 2022-07-25 | Disposition: A | Discharge status: Home / Self Care | Payer: BLUE CROSS/BLUE SHIELD | Attending: Student in an Organized Health Care Education/Training Program | Admitting: Student in an Organized Health Care Education/Training Program

## 2022-07-25 ENCOUNTER — Ambulatory Visit (HOSPITAL_BASED_OUTPATIENT_CLINIC_OR_DEPARTMENT_OTHER)
Admit: 2022-07-25 | Discharge: 2022-07-25 | Disposition: A | Discharge status: Home Health | Payer: BLUE CROSS/BLUE SHIELD | Attending: Surgery | Admitting: Surgery

## 2022-07-25 ENCOUNTER — Telehealth: Payer: Self-pay

## 2022-07-25 ENCOUNTER — Ambulatory Visit: Payer: BLUE CROSS/BLUE SHIELD | Attending: Surgery | Admitting: Surgery

## 2022-07-25 VITALS — BP 150/92 | HR 64 | Temp 96.1°F | Resp 18 | Ht 67.0 in | Wt 164.9 lb

## 2022-07-25 DIAGNOSIS — Z6825 Body mass index (BMI) 25.0-25.9, adult: Secondary | ICD-10-CM | POA: Insufficient documentation

## 2022-07-25 DIAGNOSIS — Z01818 Encounter for other preprocedural examination: Secondary | ICD-10-CM | POA: Insufficient documentation

## 2022-07-25 DIAGNOSIS — E041 Nontoxic single thyroid nodule: Secondary | ICD-10-CM | POA: Insufficient documentation

## 2022-07-25 DIAGNOSIS — E1159 Type 2 diabetes mellitus with other circulatory complications: Secondary | ICD-10-CM | POA: Insufficient documentation

## 2022-07-25 DIAGNOSIS — C73 Malignant neoplasm of thyroid gland: Secondary | ICD-10-CM | POA: Insufficient documentation

## 2022-07-25 DIAGNOSIS — R9431 Abnormal electrocardiogram [ECG] [EKG]: Secondary | ICD-10-CM

## 2022-07-25 LAB — ECG IN CLINIC
P AXIS: 50 Deg
PR INTERVAL: 149 ms
QRS INTERVAL/DURATION: 102 ms
QT: 411 ms
QTc (Bazett): 428 ms
R AXIS: 19 Deg
R-R INTERVAL AVERAGE: 881 ms
T AXIS: 16 Deg
VENTRICULAR RATE: 68 {beats}/min

## 2022-07-25 LAB — LIPID(CHOL FRACT) PANEL, BLOOD
Cholesterol: 93 MG/DL (ref <200)
HDL Cholesterol: 50 MG/DL (ref >40)
LDL Cholesterol (calc): 29 MG/DL (ref <160)
Non HDL Cholesterol (calculated): 43 MG/DL (ref <130)
Triglycerides: 71 MG/DL (ref <150)
VLDL Cholesterol (calculated): 14 MG/DL

## 2022-07-25 LAB — COMPREHENSIVE METABOLIC PANEL, BLOOD
ALT: 29 U/L (ref 7–52)
AST: 19 U/L (ref 13–39)
Albumin: 4.3 G/DL (ref 4.2–5.5)
Alk Phos: 52 U/L (ref 34–104)
BUN: 19 mg/dL (ref 7–25)
Bilirubin, Total: 0.6 mg/dL (ref 0.0–1.4)
CO2: 24 mmol/L (ref 21–31)
Calcium: 9.3 mg/dL (ref 8.6–10.3)
Chloride: 109 mmol/L — ABNORMAL HIGH (ref 98–107)
Creat: 1.1 mg/dL (ref 0.7–1.3)
Electrolyte Balance: 8 mmol/L (ref 2–12)
Glucose: 109 mg/dL (ref 85–125)
Potassium: 3.6 mmol/L (ref 3.5–5.1)
Protein, Total: 7 G/DL (ref 6.0–8.3)
Sodium: 141 mmol/L (ref 136–145)
eGFR - high estimate: >60 (ref >59)
eGFR - low estimate: >60 (ref >59)

## 2022-07-25 LAB — RANDOM URINE MICROALB/CREAT RATIO PANEL
Creatinine, G/L: 0.8 g/L
Microalbumin/Creatinine Ratio: 10 MG MICAL/GM CRE
Microalbumin: 8 MG/L

## 2022-07-25 LAB — TSH, BLOOD: TSH, Ultrasensitive: 0.783 u[IU]/mL (ref 0.450–4.120)

## 2022-07-25 LAB — FREE THYROXINE, BLOOD: Free T4: 0.81 ng/dL (ref 0.60–1.12)

## 2022-07-25 NOTE — Progress Notes (Signed)
Oncology New Patient Surgical Education    Learning needs assessment completed? Yes    I have provided and reviewed the following materials with the patient:    New Patient Folder: YES  Including:      New Patient Education Sheet : YES      Your Health Care Team contact information: YES       Map: YES    Pre-Operative instructions:  YES                                                     Additional documents provided:  Please see AVS documentation (Including follow up expectations/orders)    Stratton Villwock Judy,RN

## 2022-07-25 NOTE — Telephone Encounter (Signed)
Called the patient via [place] interpreter (# [phone]) and left the VM regarding of blood work for pre-op from Dr Ronny Bacon. Provided call back number [phone] for any questions.  Tanicka Bisaillon(Judy), RN

## 2022-07-25 NOTE — Telephone Encounter (Signed)
Called the patient and left the VM regarding of confirming for patient condition related the surgery.Provided call back number [phone] to confirm.  Tali Cleaves(Judy), RN

## 2022-07-25 NOTE — Progress Notes (Deleted)
Keith Cross, Nurse Practitioner  [address]   [place], [place] [address]  P: [phone] F: [phone]    Date of Evaluation: 05/27/2022  Referring Physician: Self, Referred  Primary Care Physician: Glenford Peers Huu    Chief Complaint: headache    History of Present Illness:  Keith Cross is a 61 year old, 19 male with OSA, HTN (uncontrolled despite medication) seen for follow up regarding headaches. Visit was conducted using [place] interpreter [phone].     Headaches present 10+ years, baseline frequency=15-20 days/month.  Present upon waking and frequently wakes him from sleep during the night. Describes as pulsating. Would last all day if left untreated but is relieved with ASA-takes 2 56m tablets, which relieves pain for 2-3 hours, then headache returns.   At previous visit, started rimegepant ODT 722m1 tablet prn. Works after an hour, 5-6 hours later, pain returns. Takes 3-4 times/week with ASA.   Referred to sleep medicine by cardiologist. [place], was advised he stopped breathing for up to a minute, sometimes multiple times/hour.  [facility] sleep study showed severe OSA with O2 nadir of 53%. Has increased use of CPAP for 4 hours/night. Recent adjustments made to improve comfort. Given new mask to try x3 months.    Blood pressure has been labile-cardiology is working up patient for underlying causes.   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 [facility] that it would be monitored. Finding present on report from MRI brain with and without contrast from 2017. Referred to Neurosurgery, seen by Dr. GoNathaneil Canarywho recommended  regular monitoring but did not believe findings explained patient's headache symptoms. Scheduled for follow up on 10/16/22.     Interval History:  Patient was last seen on office on 7/6 for follow up. At that time, patient reported chest pain and LUE numbness. EMS [facility] left sided weakness. MRI and CT angio showed no evidence of stroke or perfusion deficits. Given Decadron and topiramate. Weakness and headache persisted. Speech therapist noticed left facial droop, tongue deviation to the right. Was diagnosed with hemiplegic migraine, prescribed verapamil and topiramate, then discharged to acute rehab on 7/11.   Seen for follow up on 7/18, at which time exam showed persistent left upper and lower extremity weakness. Started physical therapy and advised to restart venlafaxine.   Seen by Dr. ShManuella GhaziStroke specialist, on 7/28, who reviewed his old and recent imaging, which did not demonstrate stroke. Exam consistent with non-physiologic features, suspicious for psychosomatic etiology. Referred to psychiatry and recommended therapy.       Failed meds: metoprolol, nortriptyline, cannot take triptans or NSAIDs due to history of stroke, venlafaxine, rimegepant, venlafaxine    Past Medical History:  Patient Active Problem List   Diagnosis    Chest pain    Abnormal stress echo    OSA (obstructive sleep apnea)    Cerebral cavernoma       Past Surgical History:   Past Surgical History:   Procedure Laterality Date    Left knee surgery         Social History  Social History  Socioeconomic History    Marital status: Married     Spouse name: Not on file    Number of children: Not on file    Years of education: Not on file    Highest education level: Not on file   Occupational History    Not on file   Tobacco Use    Smoking status: Never    Smokeless tobacco: Never   Substance and Sexual Activity    Alcohol use: Not Currently    Drug use: Not on file    Sexual activity:  Not on file   Other Topics Concern    Not on file   Social History Narrative    Not on file     Social Determinants of Health     Financial Resource Strain: Not on file   Food Insecurity: Not on file   Transportation Needs: Not on file   Physical Activity: Not on file   Stress: Not on file   Social Connections: Not on file   Intimate Partner Violence: Not on file   Housing Stability: Not on file       Family History:  Mother-stroke  Father-cancer    Home Medications:  No outpatient medications have been marked as taking for the 07/28/22 encounter (Appointment) with Elesa Massed, NP.     Current Facility-Administered Medications for the 07/28/22 encounter (Appointment) with Elesa Massed, NP   Medication Dose Route Frequency Provider Last Rate Last Admin    nitroGLYcerin (NITROLINGUAL) spray 1 spray  1 spray Sublingual Q5 Min PRN Paul Half, MD   3 spray at 03/27/21 1439       Allergies:   No Known Allergies     Review of Systems: 12 point Review of Systems is negative other than what is noted above.      Physical Exam:  There were no vitals taken for this visit.  General Exam:   Head, Eyes, Ears, Nose, Throat: Normocephalic, atraumatic.   Neck: Supple   Extremities: Warm and well-perfused, no clubbing, cyanosis, or edema.   Neurologic Exam:   Mental Status: Awake and alert, oriented x 3. Speech is fluent and prosodic.   Cranial Nerves:   Cranial Nerve 2: Pupils equal, round, reactive to light. Visual Fields full to confrontation bilaterally.   Cranial Nerves 3, 4, 6: Extraocular movements are intact with no nystagmus.   Cranial Nerve 5: Facial sensation increased to light touch in V1, V2, V3 on right side.   Cranial Nerve 7: Facial power intact bilaterally to tight eye closure, eye brow lift, cheek puff. Asymmetry noted with smile.  Cranial Nerve 8: Hearing is intact to finger rub bilaterally.   Cranial Nerves 9, 10: Palate elevates, uvula midline.   Cranial Nerve 11: Shoulder shrug and head turn are 5/5 on right, 3/5 on  left  Cranial Nerve 12: Tongue deviates to right.   Extra pyramidal: No rigidity, bradykinesia, or resting tremor  Motor: Normal bulk and tone. Power is 5/5 right deltoids, biceps, triceps, wrist extension, wrist flexion, interosseous, grip, iliopsoas, hamstring, quadriceps, dorsiflexion, plantarflexion. 3/5 to left upper and lower extremities.  Sensory: Intact to light touch in all four limbs.   Reflexes: 2+ symmetric right biceps, brachioradialis, triceps, patellae, and ankle jerks. 3+ on left side.  Gait: Remained in wheelchair     Labs:   Albumin   Date Value Ref Range Status   01/29/2022 4.0 (L) 4.2 - 5.5 G/DL Final     ALT   Date  Value Ref Range Status   01/29/2022 33 7 - 52 U/L Final     AST   Date Value Ref Range Status   01/29/2022 22 13 - 39 U/L Final     BUN   Date Value Ref Range Status   01/29/2022 20 7 - 25 mg/dL Final     Calcium   Date Value Ref Range Status   01/29/2022 9.2 8.6 - 10.3 mg/dL Final     Chloride   Date Value Ref Range Status   01/29/2022 101 98 - 107 mmol/L Final     Cholesterol   Date Value Ref Range Status   06/19/2021 97 <200 MG/DL Final     Comment:     Desirable: <200 mg/dL  Borderline High: 200-239 mg/dL  High: >240 mg/dL       CO2   Date Value Ref Range Status   01/29/2022 26 21 - 31 mmol/L Final     Creat   Date Value Ref Range Status   01/29/2022 1.2 0.7 - 1.3 mg/dL Final     eGFR - low estimate   Date Value Ref Range Status   01/29/2022 >60 >59 Final     eGFR - high estimate   Date Value Ref Range Status   01/29/2022 >60 >59 Final     Comment:     (Unit: mL/min/1.73 sq mtr)  The calculated GFR is an estimate and is NOT an accurate reflection of GFR in   patients on dialysis. The accuracy of estimated GFR is also affected by muscle   mass, and medications that affect renal tubular secretion of creatinine.  If estimated GFR will directly affect clinical decision making, consider using cystatin C to estimate GFR, and/or consult with nephrology. eGFR was calculated using the MDRD  equation (2006).       Glucose   Date Value Ref Range Status   01/29/2022 126 (H) 85 - 125 mg/dL Final     Comment:        Normal Fasting Glucose:  <100 mg/dL  Impaired Fasting Glucose: 100-125 mg/dL  Provisional DX of diabetes(must be confirmed) >125 mg/dL       Hgb   Date Value Ref Range Status   03/28/2021 15.0 13.5 - 16.9 G/DL Final     Hemoglobin A1c, POC   Date Value Ref Range Status   05/07/2022 7.4 (H) 4.0 - 5.6 % Final     Comment:     Target goal for most diabetics is <=7%     Magnesium   Date Value Ref Range Status   03/28/2021 2.0 1.9 - 2.7 mg/dL Final     Phosphorus   Date Value Ref Range Status   03/28/2021 3.9 2.5 - 5.0 MG/DL Final     PLT Count   Date Value Ref Range Status   03/28/2021 195 150 - 400 THOUS/MCL Final     Potassium   Date Value Ref Range Status   01/29/2022 3.9 3.5 - 5.1 mmol/L Final     Sodium   Date Value Ref Range Status   01/29/2022 135 (L) 136 - 145 mmol/L Final     Triglycerides   Date Value Ref Range Status   06/19/2021 98 <150 MG/DL Final     Comment:     Normal: <150 mg/dL  Borderline High: 150-199 mg/dL  High: 200-499 mg/dL  Very high: > or = 500 mg/dL       No results found for: COLORUA, APPEARUA, [place], [place], KETONEUA, SGUA,  [place], PHUA, [place], [place], NITRITEUA, [place], WBCUA, RBCUA, [place], [place], [place], [place]    Radiology:  MRI/MRA brain (05/30/2021)  MRI Brain  Mass effect: There is no midline shift, herniation, or hydrocephalus.  Brain parenchyma: There is no acute infarction. There is no acute intracranial hemorrhage. There is a subcentimeter probable cavernoma in the right temporal lobe. There is a chronic microhemorrhage in the right basal ganglia. There is a nonspecific punctate focus of susceptibility effect in the left posterior medulla. There are scattered white matter T2/FLAIR-hyperintensities, which are nonspecific and may be related to chronic microvascular ischemic change.  Vascular structures: The major vascular flow  voids are present.  Bony and soft tissue structures: There is trace fluid in the mastoid air cells. There is trace paranasal sinus mucosal thickening. The osseous structures are unremarkable. The surrounding soft tissue structures are unremarkable.     MRA head:  Anterior circulation: There is no significant stenosis, proximal occlusion, or aneurysm of bilateral intracranial ICAs, bilateral ACAs, and bilateral MCAs.  Posterior circulation: There is no significant stenosis, proximal occlusion, or aneurysm of bilateral V4 segments, basilar artery, visualized cerebellar arteries, and bilateral PCAs.  IMPRESSION:  1.  Presumed cavernoma in the right temporal lobe. Nonspecific punctate focus of susceptibility in the left posterior medulla with no evidence of acute hemorrhage.   2.  No aneurysm, significant stenosis or proximal occlusion intracranially.    Assessment and plan:  Keith Cross is a 61 year old, [name] speaking man with headaches upon waking for the past 10 years. The timing coincides with when patient stopped using CPAP. Cannot rule out migraine given associated symptoms but suspect headaches are related to OSA. Discussed impacts of untreated OSA on cardiovascular health and as likely underlying cause of headaches.   Significant left upper and lower extremity weakness, left-sided hyperreflexia and right tongue deviation noted on today's exam. Per [facility] ED report from [facility], imaging was negative for stroke and patient was diagnosed with hemiplegic migraine. However, given persistence of weakness and other exam findings, concern that recent event was due to stroke.     Follow up as scheduled with Sleep Medicine--continue to increase use of CPAP machine. Take melatonin 4m at bedtime.  Follow up as scheduled with Cardiology  Follow up as scheduled with neurosurgery for monitoring of cavernoma  Continue rimegepant ODT 775monce daily as needed. Do not exceed 1 tablet per day.  Start venlafaxine ER 7598mnce  daily for one month. If irritability persists, increase to 2 capsules per day.  Refer to Stroke specialist, Dr. ShaManuella Ghazi 06/06/22 at 3pm. Please make sure we have a copy of the CTA/MRI images before that visit. Head CT ordered.  Follow up 2 months    Approximately 40 minutes was spent by me in the direct clinical care of this patient.  Over 50% of this time was spent in the process of counseling and coordination of care, to include face-to-face time with the patient and/or family members present.      Thank you again for allowing me to participate in the care of your patient.

## 2022-07-25 NOTE — Telephone Encounter (Signed)
Spoke with patient's daughter and informed that patient needs to answer the Anesthesia screening questionnaires through the MyChart. She verbalized understanding.  Provided call back number [phone] for any questions.  Marianna Cid(Judy), RN

## 2022-07-26 LAB — GLYCOSYLATED HGB(A1C), BLOOD: Glycated Hgb, A1C: 6.3 % — ABNORMAL HIGH (ref 4.6–5.6)

## 2022-07-28 ENCOUNTER — Ambulatory Visit: Payer: BLUE CROSS/BLUE SHIELD | Admitting: Nurse Practitioner

## 2022-07-28 ENCOUNTER — Other Ambulatory Visit
Admission: RE | Admit: 2022-07-28 | Discharge: 2022-07-28 | Disposition: A | Discharge status: Home / Self Care | Payer: BLUE CROSS/BLUE SHIELD | Attending: Student in an Organized Health Care Education/Training Program | Admitting: Student in an Organized Health Care Education/Training Program

## 2022-07-28 DIAGNOSIS — R531 Weakness: Secondary | ICD-10-CM

## 2022-07-28 DIAGNOSIS — G43409 Hemiplegic migraine, not intractable, without status migrainosus: Secondary | ICD-10-CM

## 2022-07-28 DIAGNOSIS — Z8673 Personal history of transient ischemic attack (TIA), and cerebral infarction without residual deficits: Secondary | ICD-10-CM

## 2022-07-28 DIAGNOSIS — E785 Hyperlipidemia, unspecified: Secondary | ICD-10-CM | POA: Insufficient documentation

## 2022-07-28 DIAGNOSIS — E1159 Type 2 diabetes mellitus with other circulatory complications: Secondary | ICD-10-CM | POA: Insufficient documentation

## 2022-07-28 DIAGNOSIS — G43009 Migraine without aura, not intractable, without status migrainosus: Secondary | ICD-10-CM

## 2022-07-28 LAB — COMPREHENSIVE METABOLIC PANEL, BLOOD
ALT: 67 U/L — ABNORMAL HIGH (ref 7–52)
AST: 43 U/L — ABNORMAL HIGH (ref 13–39)
Albumin: 4.3 G/DL (ref 4.2–5.5)
Alk Phos: 51 U/L (ref 34–104)
BUN: 18 mg/dL (ref 7–25)
Bilirubin, Total: 0.4 mg/dL (ref 0.0–1.4)
CO2: 24 mmol/L (ref 21–31)
Calcium: 9.4 mg/dL (ref 8.6–10.3)
Chloride: 113 mmol/L — ABNORMAL HIGH (ref 98–107)
Creat: 1.1 mg/dL (ref 0.7–1.3)
Electrolyte Balance: 7 mmol/L (ref 2–12)
Glucose: 130 mg/dL — ABNORMAL HIGH (ref 85–125)
Potassium: 4 mmol/L (ref 3.5–5.1)
Protein, Total: 7 G/DL (ref 6.0–8.3)
Sodium: 144 mmol/L (ref 136–145)
eGFR - high estimate: >60 (ref >59)
eGFR - low estimate: >60 (ref >59)

## 2022-07-28 LAB — LIPID(CHOL FRACT) PANEL, BLOOD
Cholesterol: 97 MG/DL (ref <200)
HDL Cholesterol: 51 MG/DL (ref >40)
LDL Cholesterol (calc): 37 MG/DL (ref <160)
Non HDL Cholesterol (calculated): 46 MG/DL (ref <130)
Triglycerides: 46 MG/DL (ref <150)
VLDL Cholesterol (calculated): 9 MG/DL

## 2022-07-28 LAB — RANDOM URINE MICROALB/CREAT RATIO PANEL
Creatinine, G/L: 0.9 g/L
Microalbumin/Creatinine Ratio: 25.6 MG MICAL/GM CRE
Microalbumin: 23 MG/L

## 2022-07-28 LAB — GLYCOSYLATED HGB(A1C), BLOOD: Glycated Hgb, A1C: 6.5 % — ABNORMAL HIGH (ref 4.6–5.6)

## 2022-07-28 NOTE — Anesthesia Preprocedure Evaluation (Addendum)
ANESTHESIA PRE-OPERATIVE EVALUATION    Patient Information    Name: Keith Cross    MRN: [id number]    DOB: 11/27/1960    Age: 61 year old    Sex: male  Procedure(s) with comments:  T3 OP Right thyroid lobectomy, - NIMS monitoring      Pre-op Vitals:   See Vitals Flowsheets for details.        Primary language spoken:  [name]    ROS/Medical History:       History of Present Illness: 49 Male with Papillary thyroid carcinoma     Hx of CVA 2015, CAD, hypertension, hyperlipidemia, Type 2 DM, OSA on CPAP.        General:  PSQ 07/25/22  Visited ED in last 6 months: New Chest pain, SOB  Can NOT do light to moderate activities ([facility] < 4)    Smoking Status: Never  Smokeless Tobacco Status: Never  Alcohol use: Not Currently  Drug use: Not Asked         Cardiovascular:   ASA81,   hypertension,  #HLD  #Abnormal stress echo    EKG 07/25/22 (Media)  SR; Nonspecific T-Wave abnormality.  HR 68bpm    Exercise stress test 03/24/21 (Epic)   1. Positive exercise stress test for ischemia.   2. Patient developed chest pain at peak excercise that improved with recovery and rest.   3. Stage 1: 1: Mid and apical inferior wall is abnormal.      Stage 2: 2: Apical inferior segment is abnormal.   4. The peak estimated pulmonary artery systolic pressure during exercise was indeterminate.   5. Average functional capacity.     Echo 03/27/21 (Epic)   1. Mildly decreased left ventricular systolic function. The left ventricular ejection fraction is 51% by Biplane.   2. Left ventricular ejection fraction is 53% by 3D.   3. Mild concentric left ventricular hypertrophy.   4. Normal LV filling pressures.   5. Normal right ventricular size and systolic function.     LHC 03/27/21 (Epic)   1. Mid LAD with 50% stenosis. RFR of mid LAD was 0.95. This was physiologically non-significant.   2. LVEDP 20-25 mm Hg.  RECOMMENDATIONS:   1. Maximize medical therapy.   2. Aggressive risk factor modification efforts.   3. Monitor vitals and arterial access  site/pulses.    Cardiology Optimization 07/22/22 (Epic)  Patient pending thyroidectomy  -Patient's RCRI is 0 placing him at 3.9% risk of MACE in 30 days.  -He has recent coronary angiogram showing no obstructive coronary artery disease  -Patient has no evidence of unstable coronary syndrome, decompensated heart failure or uncontrolled arrhythmias.  -He can proceed to planned procedure without further cardiac risk stratification.         Anesthesia History:  no history of anesthetic complications,  PONV,  no history of difficult IV access,  Lt knee surgery Pulmonary:   asthma,  sleep apnea, CPAP,  Home O2-Per PSQ 07/25/22  Can safely lie flat on back for an hour    CXR 07/25/22 (Epic)  Findings/No consolidation, effusion or pneumothorax.  Normal cardiomediastinal silhouette. No acute osseous abnormality.        Neuro/Psych:   TIA/CVA (Hx of CVA),  #Headaches  #Lt sided weakness    CT Head 06/05/22 (Epic)  No acute intracranial abnormality.  Stable right temporal lobe cavernoma.    Neurology 06/06/22 (Epic)     Hematology/Oncology:   history of cancer,  #Papillary thyroid carcinoma  Surg/Onc 07/25/22 (Epic)  Plan for right thyroid lobectomy, possible central neck dissection.      GI/Hepatic:  GERD,   Infectious Disease:     Renal:  fluid/electrolyte disorder,  #Hypokalemia   Endocrine/Other:  diabetes (A1c 6.3 (07/25/22)), oral meds only,   #Gout    #Hx of Lt adrenal Nodule  Followed by Endocrine 07/27/22 (Epic)  - Unremarkable plasma metanephrines (normetanephrines very minimally elevated at 0.98, normal < 0.9).   - 24-hour urine metanephrines were wnl.   - Aldosterone/renin wnl with normal dexamethasone suppression test.   Pregnancy History:   Pediatrics:    no premature birth,       Pre Anesthesia Testing (PCC/CPC) notes/comments:    [facility] Test & records reviewed by [facility] Provider.                         Reviewed by Halina Maidens, NP 07/28/2022 9:13 AM    Labs 07/25/22 (Epic)  Na 141/ K 3.6/ BUN 19/ Cr 1.1/ Glu 109                  Physical Exam    Airway:   Inter-inciser distance > 4 cm  Prognanth Able    Mallampati: II  Neck ROM: full          Cardiovascular:  - cardiovascular exam normal             Pulmonary:  - pulmonary exam normal              Neuro/Neck/Skeletal/Skin:      Dental:  - normal exam    Abdominal:                                                                                                     Past Medical History:   Diagnosis Date   . History of CVA (cerebrovascular accident)    . Hyperlipidemia    . Hypertension    . Type 2 diabetes mellitus (CMS-HCC)      Past Surgical History:   Procedure Laterality Date   . Left knee surgery       Social History     Socioeconomic History   . Marital status: Married   Tobacco Use   . Smoking status: Never   . Smokeless tobacco: Never   Substance and Sexual Activity   . Alcohol use: Not Currently       Current Outpatient Medications   Medication Sig Dispense Refill   . acetaminophen (TYLENOL) 325 MG tablet Take 2 tablets (650 mg) by mouth.     Marland Kitchen albuterol 108 (90 Base) MCG/ACT inhaler TAKE 2 PUFFS EVERY 4 HOURS     . allopurinol (ZYLOPRIM) 300 MG tablet Take 1 tablet (300 mg) by mouth daily.     Marland Kitchen amLODIPINE (NORVASC) 10 MG tablet Take 1 tablet (10 mg) by mouth daily. 90 tablet 3   . aspirin 81 MG EC tablet Take 1 tablet (81 mg) by mouth daily. 90 tablet 3   . atorvastatin (LIPITOR) 80 MG tablet Take 1 tablet (80 mg) by  mouth daily. 90 tablet 3   . Blood Glucose Monitoring Suppl (TRUE METRIX METER) w/Device KIT As Directed.     . carvedilol (COREG) 25 MG tablet Take 1 tablet (25 mg) by mouth 2 times daily (with meals). 180 tablet 3   . colchicine 0.6 MG tablet TAKE 2 TABLETS NOW AND TAKE 1 TABLET 1 HOUR LATER FOR 1 DAY THEN TWICE A DAY     . empagliflozin (JARDIANCE) 25 mg tablet Take 1 tablet (25 mg) by mouth daily. 90 tablet 3   . ezetimibe (ZETIA) 10 MG tablet Take 1 tablet (10 mg) by mouth daily. 90 tablet 3   . furosemide (LASIX) 40 MG tablet TAKE 1 TABLET BY MOUTH  EVERY DAY IN THE MORNING     . galcanezumab-gnlm (EMGALITY) 120 mg/mL injection pen Inject 1 mL (120 mg) under the skin every 30 days. 1 mL 11   . hydrALAZINE (APRESOLINE) 25 MG tablet Take 1 tablet (25 mg) by mouth as needed (Take if SBP>160, up to twice daily). 90 tablet 3   . meclizine (ANTIVERT) 25 MG tablet      . metFORMIN (GLUCOPHAGE) 1000 mg tablet Take 1 tablet (1,000 mg) by mouth 2 times daily.     . nitroGLYcerin (NITRODUR) 0.1 MG/HR patch      . ondansetron (ZOFRAN ODT) 4 MG disintegrating tablet TAKE 1 TABLET BY MOUTH EVERY 6 HOURS AS NEEDED FOR NAUSEA OR VOMITING.     . pantoprazole (PROTONIX) 40 MG tablet Take 1 tablet (40 mg) by mouth daily.     . pioglitazone (ACTOS) 30 mg tablet Take 1 tablet (30 mg) by mouth.     . potassium chloride (KLOR-CON M20) 20 MEQ tablet TAKE 1 TABLET DAILY IN THE MORNING     . rimegepant (NURTEC ODT) tablet Dissolve 1 tablet (75 mg) on or under the tongue daily as needed (to treat migraines). Do not take more than one tablet per day. 16 tablet 11   . topiramate (TOPAMAX) 50 MG tablet Take 1 tablet (50 mg) by mouth 2 times daily.     . TRUE METRIX BLOOD GLUCOSE TEST test strip CHECK BLOOD SUGAR ONCE A DAY     . TRUEplus Lancets 30G MISC CHECK BLOOD SUGAR ONCE A DAY     . venlafaxine XR (EFFEXOR XR) 75 MG capsule Take 1 capsule (75 mg) by mouth daily. 90 capsule 1     Current Facility-Administered Medications   Medication Dose Route Frequency Provider Last Rate Last Admin   . nitroGLYcerin (NITROLINGUAL) spray 1 spray  1 spray Sublingual Q5 Min PRN Paul Half, MD   3 spray at 03/27/21 1439     No Known Allergies    Labs and Other Data  Lab Results   Component Value Date    SODIUM 141 07/25/2022    K 3.6 07/25/2022    CL 109 (H) 07/25/2022    CO2 24 07/25/2022    BUN 19 07/25/2022    CREAT 1.1 07/25/2022    GLU 109 07/25/2022    [place] 9.3 07/25/2022     Lab Results   Component Value Date    AST 19 07/25/2022    ALT 29 07/25/2022    ALK 52 07/25/2022    ALB 4.3 07/25/2022     TBILI 0.6 07/25/2022    DBILI 0.2 03/28/2021     Lab Results   Component Value Date    WBCCOUNT 5.6 03/28/2021    RBC 5.34 03/28/2021    HGB  15.0 03/28/2021    HCT 46.0 03/28/2021    MCV 86.2 03/28/2021    MCHC 32.7 03/28/2021    RDW 14.5 (H) 03/28/2021    PLT 195 03/28/2021    MPVH 8.2 03/28/2021     Lab Results   Component Value Date    INR 0.95 03/27/2021    PTT 28.5 03/27/2021     No results found for: PH, PO2, PCO2    Anesthesia Plan:  Risks and Benefits of Anesthesia  I personally examined the patient immediately prior to the anesthetic and reviewed the pertinent medical history, drug and allergy history, laboratory and imaging studies and consultations. I have determined that the patient has had adequate assessment and testing.    Anesthetic techniques, invasive monitors, anesthetic drugs for induction, maintenance and post-operative analgesia, risks and alternatives have been explained to the patient and/or patient's representatives.    I have prescribed the anesthetic plan:         Planned anesthesia method: General         ASA 3 (Severe systemic disease)     Potential anesthesia problems identified and risks including but not limited to the following were discussed with patient and/or patient's representative: Recall, Dental injury or sore throat, Ocular injury, Administration of blood products, Nerve injury and Injury to brain, heart and other organs    Planned monitoring method: Routine monitoring  Comments: (Denies PONV hx. Denies GERD and symptoms. Denies active chest pain or sob. States he walks up one flight of stairs at home without any issues and does daily gardening. )    Informed Consent:  Anesthetic plan and risks discussed with Patient.  Use of blood products discussed with patient who.         Plan discussed with Attending.

## 2022-07-29 ENCOUNTER — Telehealth: Payer: Self-pay

## 2022-07-29 ENCOUNTER — Other Ambulatory Visit
Admission: RE | Admit: 2022-07-29 | Discharge: 2022-07-29 | Disposition: A | Discharge status: Home / Self Care | Payer: BLUE CROSS/BLUE SHIELD | Attending: Surgery | Admitting: Surgery

## 2022-07-29 DIAGNOSIS — C73 Malignant neoplasm of thyroid gland: Secondary | ICD-10-CM | POA: Insufficient documentation

## 2022-07-29 LAB — CBC WITH DIFF, BLOOD
ANC automated: 3.5 10*3/uL (ref 2.0–8.1)
Basophils %: 0.9 %
Basophils Absolute: 0.1 10*3/uL (ref 0.0–0.2)
Eosinophils %: 5.5 %
Eosinophils Absolute: 0.3 10*3/uL (ref 0.0–0.5)
Hematocrit: 44.9 % (ref 39.5–50.0)
Hgb: 14.4 G/DL (ref 13.5–16.9)
Lymphocytes %: 27.9 %
Lymphocytes Absolute: 1.7 10*3/uL (ref 0.9–3.3)
MCH: 28.4 PG (ref 27.0–33.5)
MCHC: 32 G/DL (ref 32.0–35.5)
MCV: 88.6 FL (ref 81.5–97.0)
MPV: 8.7 FL (ref 7.2–11.7)
Monocytes %: 9.5 %
Monocytes Absolute: 0.6 10*3/uL (ref 0.0–0.8)
Neutrophils % (A): 56.2 %
PLT Count: 271 10*3/uL (ref 150–400)
RBC: 5.07 10*6/uL (ref 4.38–5.62)
RDW-CV: 15.2 % — ABNORMAL HIGH (ref 11.6–14.4)
White Bld Cell Count: 6.2 10*3/uL (ref 4.0–10.5)

## 2022-07-29 LAB — COMPREHENSIVE METABOLIC PANEL, BLOOD
ALT: 49 U/L (ref 7–52)
AST: 23 U/L (ref 13–39)
Albumin: 4.1 G/DL — ABNORMAL LOW (ref 4.2–5.5)
Alk Phos: 51 U/L (ref 34–104)
BUN: 13 mg/dL (ref 7–25)
Bilirubin, Total: 0.6 mg/dL (ref 0.0–1.4)
CO2: 24 mmol/L (ref 21–31)
Calcium: 9.3 mg/dL (ref 8.6–10.3)
Chloride: 108 mmol/L — ABNORMAL HIGH (ref 98–107)
Creat: 1 mg/dL (ref 0.7–1.3)
Electrolyte Balance: 9 mmol/L (ref 2–12)
Glucose: 196 mg/dL — ABNORMAL HIGH (ref 85–125)
Potassium: 4.2 mmol/L (ref 3.5–5.1)
Protein, Total: 6.7 G/DL (ref 6.0–8.3)
Sodium: 141 mmol/L (ref 136–145)
eGFR - high estimate: >60 (ref >59)
eGFR - low estimate: >60 (ref >59)

## 2022-07-29 LAB — PT/INR/PTT
INR: 0.94 (ref 0.87–1.12)
PTT: 28.5 s (ref 24.2–36.7)
Prothrombin Time: 12.2 s (ref 11.6–14.1)

## 2022-07-29 LAB — THYROID FUNCTION PANEL (ULTRASENSITIVE TSH + FREE T4)
Free T4: 0.82 ng/dL (ref 0.60–1.12)
TSH, Ultrasensitive: 0.806 u[IU]/mL (ref 0.450–4.120)

## 2022-07-29 NOTE — Telephone Encounter (Signed)
Called the patient's daughter and mentioned that patient needs to do blood work from Dr Ronny Bacon. Reminded not to take ASA 81 mg before the surgery 08/01/2022. Because patient didn't pick up the phone and no response. Patient's daughter will call patient and mention.Provided call back number [phone] for any questions.  Keith Cross(Judy), RN

## 2022-07-31 ENCOUNTER — Telehealth: Payer: Self-pay

## 2022-07-31 NOTE — Telephone Encounter (Signed)
Spoke with patient via [name] interpreter [phone]) and given pre-op instruction for surgery with Dr.Mao Melissa (9 /22 / 2023 )  Confirmed check in location, NPO after midnight for 8 hours, denies taking any blood thinners, NSAIDS, any Vitamins or supplements. Need a driver  Hibiclens shower instruction confirmed and POST OP appointment confirmed. Patient verbalized understanding.  All questions answered and provided call back number [phone] for any further questions.  Marijo File ),RN

## 2022-08-01 ENCOUNTER — Ambulatory Visit: Payer: BLUE CROSS/BLUE SHIELD

## 2022-08-01 ENCOUNTER — Ambulatory Visit (HOSPITAL_BASED_OUTPATIENT_CLINIC_OR_DEPARTMENT_OTHER): Payer: BLUE CROSS/BLUE SHIELD

## 2022-08-01 ENCOUNTER — Encounter: Admission: RE | Disposition: A | Discharge status: Home Health | Payer: Self-pay | Attending: Surgery

## 2022-08-01 ENCOUNTER — Ambulatory Visit
Admission: RE | Admit: 2022-08-01 | Discharge: 2022-08-01 | Disposition: A | Discharge status: Home / Self Care | Payer: BLUE CROSS/BLUE SHIELD | Attending: Surgery | Admitting: Surgery

## 2022-08-01 DIAGNOSIS — Z79899 Other long term (current) drug therapy: Secondary | ICD-10-CM | POA: Insufficient documentation

## 2022-08-01 DIAGNOSIS — Z7984 Long term (current) use of oral hypoglycemic drugs: Secondary | ICD-10-CM | POA: Insufficient documentation

## 2022-08-01 DIAGNOSIS — G4733 Obstructive sleep apnea (adult) (pediatric): Secondary | ICD-10-CM | POA: Insufficient documentation

## 2022-08-01 DIAGNOSIS — C73 Malignant neoplasm of thyroid gland: Secondary | ICD-10-CM | POA: Insufficient documentation

## 2022-08-01 DIAGNOSIS — E119 Type 2 diabetes mellitus without complications: Secondary | ICD-10-CM | POA: Insufficient documentation

## 2022-08-01 DIAGNOSIS — I10 Essential (primary) hypertension: Secondary | ICD-10-CM | POA: Insufficient documentation

## 2022-08-01 DIAGNOSIS — E785 Hyperlipidemia, unspecified: Secondary | ICD-10-CM | POA: Insufficient documentation

## 2022-08-01 LAB — GLUCOSE, POINT OF CARE
Glucose, Point of Care: 106 MG/DL (ref 70–125)
Glucose, Point of Care: 120 MG/DL (ref 70–125)

## 2022-08-01 SURGERY — LOBECTOMY, THYROID, TOTAL
Anesthesia: General | Laterality: Right | Wound class: Class I (Clean)

## 2022-08-01 MED ORDER — HYDROCODONE-ACETAMINOPHEN 5-325 MG OR TABS
1.0000 | ORAL_TABLET | ORAL | Status: DC | PRN
Start: 2022-08-01 — End: 2022-08-01

## 2022-08-01 MED ORDER — ROCURONIUM BROMIDE 50 MG/5ML IV SOLN
INTRAVENOUS | Status: DC | PRN
Start: 2022-08-01 — End: 2022-08-01
  Administered 2022-08-01: 20 mg via INTRAVENOUS

## 2022-08-01 MED ORDER — OXYCODONE HCL 5 MG OR TABS
5.0000 mg | ORAL_TABLET | ORAL | Status: DC | PRN
Start: 2022-08-01 — End: 2022-08-01
  Administered 2022-08-01: 5 mg via ORAL

## 2022-08-01 MED ORDER — TRAMADOL HCL 50 MG OR TABS
50.0000 mg | ORAL_TABLET | Freq: Four times a day (QID) | ORAL | 0 refills | Status: AC | PRN
Start: 2022-08-01 — End: 2022-10-30

## 2022-08-01 MED ORDER — PLASMA-LYTE A IV SOLN
INTRAVENOUS | Status: DC | PRN
Start: 2022-08-01 — End: 2022-08-01

## 2022-08-01 MED ORDER — OXYCODONE HCL 5 MG OR TABS
ORAL_TABLET | ORAL | Status: AC
Start: 2022-08-01 — End: ?
  Filled 2022-08-01: qty 1

## 2022-08-01 MED ORDER — ONDANSETRON HCL 4 MG/2ML IV SOLN
INTRAMUSCULAR | Status: DC | PRN
Start: 2022-08-01 — End: 2022-08-01
  Administered 2022-08-01: 4 mg via INTRAVENOUS

## 2022-08-01 MED ORDER — FENTANYL CITRATE (PF) 100 MCG/2ML IJ SOLN
25.0000 ug | INTRAMUSCULAR | Status: DC | PRN
Start: 2022-08-01 — End: 2022-08-01

## 2022-08-01 MED ORDER — FENTANYL CITRATE (PF) 100 MCG/2ML IJ SOLN
50.0000 ug | INTRAMUSCULAR | Status: DC | PRN
Start: 2022-08-01 — End: 2022-08-01
  Administered 2022-08-01 (×2): 50 ug via INTRAVENOUS

## 2022-08-01 MED ORDER — SODIUM CHLORIDE 0.9 % IR SOLN
Status: DC | PRN
Start: 2022-08-01 — End: 2022-08-01
  Administered 2022-08-01: 500 mL

## 2022-08-01 MED ORDER — PHENYLEPHRINE HCL (PRESSORS) 10 MG/ML IV SOLN
INTRAVENOUS | Status: AC
Start: 2022-08-01 — End: ?
  Filled 2022-08-01: qty 2

## 2022-08-01 MED ORDER — FENTANYL CITRATE (PF) 100 MCG/2ML IJ SOLN OPTIME (~~LOC~~)
INTRAMUSCULAR | Status: DC | PRN
Start: 2022-08-01 — End: 2022-08-01
  Administered 2022-08-01: 50 ug via INTRAVENOUS
  Administered 2022-08-01: 100 ug via INTRAVENOUS

## 2022-08-01 MED ORDER — FENTANYL CITRATE (PF) 100 MCG/2ML IJ SOLN
INTRAMUSCULAR | Status: AC
Start: 2022-08-01 — End: ?
  Filled 2022-08-01: qty 2

## 2022-08-01 MED ORDER — SUGAMMADEX SODIUM 200 MG/2ML IV SOLN
INTRAVENOUS | Status: DC | PRN
Start: 2022-08-01 — End: 2022-08-01
  Administered 2022-08-01: 200 mg via INTRAVENOUS

## 2022-08-01 MED ORDER — PHENYLEPHRINE DILUTION 100 MCG/ML IJ SOLN
INTRAVENOUS | Status: DC | PRN
Start: 2022-08-01 — End: 2022-08-01
  Administered 2022-08-01 (×5): 100 ug via INTRAVENOUS

## 2022-08-01 MED ORDER — PROPOFOL 200 MG/20ML IV EMUL OPTIME (~~LOC~~)
INTRAVENOUS | Status: DC | PRN
Start: 2022-08-01 — End: 2022-08-01
  Administered 2022-08-01: 30 mg via INTRAVENOUS
  Administered 2022-08-01: 50 mg via INTRAVENOUS
  Administered 2022-08-01: 20 mg via INTRAVENOUS
  Administered 2022-08-01: 30 mg via INTRAVENOUS
  Administered 2022-08-01: 50 mg via INTRAVENOUS
  Administered 2022-08-01: 20 mg via INTRAVENOUS

## 2022-08-01 MED ORDER — PHENYLEPHRINE HCL (PRESSORS) 10 MG/ML IV SOLN
INTRAVENOUS | Status: DC | PRN
Start: 2022-08-01 — End: 2022-08-01
  Administered 2022-08-01: 35 ug/min via INTRAVENOUS
  Administered 2022-08-01: 30 ug/min via INTRAVENOUS
  Administered 2022-08-01: 50 ug/min via INTRAVENOUS
  Administered 2022-08-01: 40 ug/min via INTRAVENOUS
  Administered 2022-08-01: 20 ug/min via INTRAVENOUS

## 2022-08-01 MED ORDER — LIDOCAINE 1% SOLN OPTIME (~~LOC~~)
INTRAMUSCULAR | Status: DC | PRN
Start: 2022-08-01 — End: 2022-08-01
  Administered 2022-08-01: 70 mg via INTRAVENOUS

## 2022-08-01 MED ORDER — EPHEDRINE SULFATE (PRESSORS) 50 MG/ML IJ SOLN
INTRAMUSCULAR | Status: DC | PRN
Start: 2022-08-01 — End: 2022-08-01
  Administered 2022-08-01 (×3): 5 mg via INTRAVENOUS

## 2022-08-01 MED ORDER — NALOXONE HCL 0.4 MG/ML IJ SOLN
0.2000 mg | Freq: Once | INTRAMUSCULAR | Status: DC | PRN
Start: 2022-08-01 — End: 2022-08-01

## 2022-08-01 MED ORDER — PROPOFOL 1000 MG/100 ML INFUSION (~~LOC~~)
INTRAVENOUS | Status: DC | PRN
Start: 2022-08-01 — End: 2022-08-01
  Administered 2022-08-01: 20 ug/kg/min via INTRAVENOUS
  Administered 2022-08-01 (×2): 15 ug/kg/min via INTRAVENOUS
  Administered 2022-08-01: 25 ug/kg/min via INTRAVENOUS

## 2022-08-01 MED ORDER — OXYCODONE HCL 5 MG OR TABS
10.0000 mg | ORAL_TABLET | ORAL | Status: DC | PRN
Start: 2022-08-01 — End: 2022-08-01

## 2022-08-01 MED ORDER — PROPOFOL 1000 MG/100ML IV EMUL
INTRAVENOUS | Status: AC
Start: 2022-08-01 — End: ?
  Filled 2022-08-01: qty 100

## 2022-08-01 SURGICAL SUPPLY — 38 items
AGENT HEMOSTATIC 4X4IN SURGICEL FIBRILLAR (Hemostatic agents/wax/sealants-absorbable) ×1 IMPLANT
Agent Hemostatic Sponge 1x2in Surgicel Sterile (Hemostatic agents/wax/sealants-absorbable) ×1 IMPLANT
BLADE 15 SHRP BP RB-BCK CBNSTL SRG STRL LF (Knives/Blades) ×1 IMPLANT
BLADE CLIPPER GENERAL PURPOSE CAREFUSION (Knives/Blades) IMPLANT
BLANKETAIRORLWRBODY (Misc Medical Supply) ×1 IMPLANT
CLIPS LIGACLIP TIT SM 20 EA/CTG (Misc Surgical Supply) ×1 IMPLANT
CONT SPECIMEN OR WRAP 120ML (Misc Medical Supply) ×1 IMPLANT
DISSECTOR ENDO PEANUT 5 MM X 45 CM X3 EA USS (Disp Instruments) ×1 IMPLANT
DRAPE, TOP, 102 X 53, STERILE (Drapes/towels) ×1 IMPLANT
DRAPESTERI1018INSTRPOUCH6.75X12" (Misc Medical Supply) ×1 IMPLANT
DRESSING - SMALL TEGADERM 2 X 2 (Dressings/packing) ×2 IMPLANT
DRESSING SPONGE SPLIT 4 X 4 (Dressings/packing) ×1 IMPLANT
GLOVES BIOGEL 6.0 LTX (Gloves/Gowns) ×2 IMPLANT
GLOVES INDICATOR 6.5 UG LTX PF GRN (Gloves/Gowns) ×1 IMPLANT
Garment Compression Calf Regular Reprocessed  Up to 18in SCD Nonsterile (Dressings/packing) ×1 IMPLANT
HOOK RETRACTOR 12MM BL (Disp Instruments) ×1 IMPLANT
Liquiband Exceed 0.8ML Topical Skin Adhesive (External Fixator parts/clamps/rods) ×1 IMPLANT
MAGNETIC INSTRUMENT MAT (Drapes/towels) ×1 IMPLANT
MANIFOLD SCT NPTN 2 2 STD 4 PORT WST (Reusable instruments/equipment) ×1 IMPLANT
PACK MINOR REV 7 (Procedure Packs/kits) ×1 IMPLANT
PROBE STIM EMG MP FLUSH PRASS NIM (Laser Fibers/Probes) ×1 IMPLANT
STAPLER SKIN APPOSE STAINLESS STEEL PLASTIC 35 COUNT WIDE STAPLE CARTRIDGE ERGONOMIC HANDLE REMOVER (Staplers and staple reloads) ×1 IMPLANT
STERISTRIPS1/2"X4"6E4547 (Misc Medical Supply) ×1 IMPLANT
Shears Ultrasonic 9cm Curved Reprocessed Harmonic Focus (Disp Instruments) ×1 IMPLANT
Sponge Cherry Dissector 10mm X-ray Detectable Sterile (Dressings/packing) ×3 IMPLANT
Sponge Gauze 4x4in 4-Ply Non-woven Sterile (Dressings/packing) ×1 IMPLANT
Sponge Surgical 4x4in 16-ply Xray Detectable RFID Sterile (Dressings/packing) ×2 IMPLANT
Suture Biosyn 5-0 P-12 45cm Undyed (Suture) ×1 IMPLANT
Suture Polysorb 3-0 V-20 75cm Undyed (Suture) ×2 IMPLANT
Suture Sofsilk 2-0 Pre-cut 12x75cm Black (Suture) ×2 IMPLANT
Suture Sofsilk 2-0 V-20 75cm Black (Suture) ×1 IMPLANT
Suture Sofsilk 3-0 Pre-cut 12x75cm Black (Suture) ×2 IMPLANT
Suture Sofsilk 3-0 V-20 5x45cm Black (Suture) ×1 IMPLANT
Syringe 10ml Luer-Lok Tip Disp (Misc Medical Supply) ×2 IMPLANT
TIP CAUT BLADE 1" CTD INSULATED (Cranial electrodes/grids) ×1 IMPLANT
Towel Surgical 17x26in Xray Detectable RFID White 4pk Sterile (Drapes/towels) ×1 IMPLANT
Tray Foley Catheter 14Fr 10ml Temp-Sensing 1-Layer Silicone (Kits/Sets/Trays) IMPLANT
U-Drape Sterile Plastic Split Sheet with 6x24 15x61cm Split 54x76 137x193cm (Drapes/towels) ×1 IMPLANT

## 2022-08-01 NOTE — Discharge Instructions (Addendum)
Ok to take the top dressing off tomorrow  Ok to shower on Sunday  Follow up as scheduled  Will send Tramadol to his pharmacy .    General Anesthesia, Adult, Care After  Refer to this sheet in the next few weeks. These instructions provide you with information on caring for yourself after your procedure. Your health care provider may also give you more specific instructions. Your treatment has been planned according to current medical practices, but problems sometimes occur. Call your health care provider if you have any problems or questions after your procedure.  WHAT TO EXPECT AFTER THE PROCEDURE  After the procedure, it is typical to experience:  Sleepiness.  Nausea and vomiting.  HOME CARE INSTRUCTIONS  For the first 24 hours after general anesthesia:  Have a responsible person with you.  Do not drive a car. If you are alone, do not take public transportation.  Do not drink alcohol.  Do not take medicine that has not been prescribed by your health care provider.  Do not sign important papers or make important decisions.  You may resume a normal diet and activities as directed by your health care provider.  Change bandages (dressings) as directed.  If you have questions or problems that seem related to general anesthesia, call the hospital and ask for the anesthetist or anesthesiologist on call.  SEEK MEDICAL CARE IF:  You have nausea and vomiting that continue the day after anesthesia.  You develop a rash.  SEEK IMMEDIATE MEDICAL CARE IF:   You have difficulty breathing.  You have chest pain.  You have any allergic problems.     This information is not intended to replace advice given to you by your health care provider. Make sure you discuss any questions you have with your health care provider.     Document Released: 02/02/2001 Document Revised: 11/17/2014 Document Reviewed: 10/11/2015  Elsevier Interactive Patient Education 2017 [employer].

## 2022-08-01 NOTE — H&P (Signed)
HISTORY & PHYSICAL - INTERVAL ASSESSMENT & UPDATE     Keith Cross        MRN: [id number]      I have reviewed the current history and physical (which is less than 50 days old) and verified its content. I have reexamined the patient today. There are no significant changes to medical status, history or physical examinations in the 24 hours prior to the procedure.  Pre-operative antibiotics are not indicated. Patient is undergoing a procedure in which antibiotics are not indicated.    Proceed to OR for right thyroid lobectomy, possible central neck dissection    Murriel Hopper, MD       08/01/22  1:30 PM

## 2022-08-01 NOTE — Op Note (Signed)
Operative Report    Derk Doubek  61 year old [birth year]  [id number]      Surgeon: Inetta Fermo, MD    Assistant: Chipper Herb, MD (Surg onc fellow)    Pre-operative diagnosis: Papillary thyroid cancer    Post-operative diagnosis: Same    Procedure: Right thyroid lobectomy    Specimen removed: Right thyroid lobe (short stitch isthmus, long stitch right upper lobe)    Anesthesia: General endotracheal anesthesia  IVF: 800 mL crystalloid  EBL: 10 cc  UOP: N/A    Findings:   -Small palpable right mid lobe nodule  -Right recurrent laryngeal and vagus nerves identified and intact throughout case and at conclusion of case    Complications:     Indications for procedure: 61 yo M with PMH significant for CVA 2015, CAD, hypertension, hyperlipidemia, Type 2 DM, OSA on CPAP. Previously evaluated by me on 12/21/20 and 01/23/21 for a 0.9 left adrenal nodule with imaging features c/w benign adenoma. Biochemical workup was negative. Presents for evaluation of a thyroid nodule. He had a reported history of a thyroid nodule diagnosed when he lived in [place] Thyroid US from 01/20/21 with a 1.1 cm right thyroid nodule, TIRADS 4. Surveillance [place] on 03/06/22 demonstrated a 1.3 cm right thyroid nodule, TIRADS 5. Subsequent FNA of right thyroid nodule on 06/18/22 c/w papillary thyroid carcinoma. After discussion about surgical options, a right thyroid lobectomy, possible central neck dissection was recommended. Details of the procedure and associated risks discussed, and informed consent obtained with [name] translator in the office.       Procedure performed:   The patient was greeted in the holding area, the right neck marked, then brought to the operating room and appropriately identified by name and wristband. The patient was transferred onto the operating room table in a supine position. General anesthesia was induced without incident. The patient was intubated with a NIMS endotracheal tube with a Glidescope uneventfully. A shoulder  roll was placed to slightly extend the neck. Bilateral arms were tucked, with all bony prominences padded. The table was placed in a modified beach chair position. The leads for the NIMS monitor were attached. The patient's neck was prepped with Betadine, and then draped in usual sterile fashion. A time out was performed to ensure correct patient and procedure.    A 5 cm transverse incision was made approximately 2 fingerbreadths above the sternal notch in an existing skin crease. Dissection was carried down through the subcutaneous tissue and platysma with Bovie electrocautery. Sub-platysmal flaps were raised superiorly and inferiorly. The strap muscles were divided in the midline raphe.     Attention was first turned towards the right thyroid lobe. The strap muscles were elevated off the thyroid lobe. The avascular plane lateral to the thyroid lobe was cleared with a peanut. The carotid sheath was then identified and opened. The right vagus nerve was stimulated with the NIMS monitor and noted to be intact. The superior pole of the right thyroid lobe was then mobilized, using a combination of Harmonic scalpel as well as 2-0 and 3-0 silk ties to ligate the vascular pedicle. The right superior parathyroid gland was identified and spared. The inferior pole of the right thyroid lobe was mobilized in the same manner as the superior pole. The right recurrent laryngeal nerve was identified and noted to be intact when stimulated with the NIMS monitor. The thyroid lobe was divided at the Ligament of Berry. The attachments of the right thyroid lobe and isthmus to the  anterior trachea were divided. The thyroid was then divided at the junction of the isthmus with the left thyroid lobe.The specimen was handed off as right thyroid lobe, with a short stitch to mark the isthmus and long stitch to mark the right upper lobe. The right recurrent laryngeal and vagus nerves were again identified and noted to be intact when stimulated  with the NIMS monitor. Hemostasis was achieved.    As there was no abnormal or suspicious lymphadenopathy identified intraoperatively, a central neck dissection was not performed.    A valsalva maneuver was performed to 30 mmHg and the right thyroidectomy bed was inspected. Hemostasis was ensured. Fibrillar was placed into the right thyroidectomy bed. The strap muscles were closed in the midline with a running 3-0 vicryl, leaving a 2cm opening inferiorly. A valsalva maneuver to 30 mmHg was repeated and hemostasis still ensured. The platysma was closed with several buried, interrupted 3-0 vicryl sutures. The skin was closed with 5-0 monocryl in a running subcuticular fashion. Steri-strips and a sterile dressing were applied. All sponge and instrument counts were correct at the end of the case. I was scrubbed and present for the entire case. The patient was awoken from anesthesia and transferred to the PACU in stable condition.      Inetta Fermo, MD  Endocrine Surgery  Division of Surgical Oncology

## 2022-08-01 NOTE — Plan of Care (Signed)
Problem: Promotion of Perioperative Health and Safety  Goal: Promotion of Health and Safety of the Perioperative Patient  Description: The patient remains safe, receives treatment appropriate to the surgical intervention and patient's physiological needs and is discharged or transferred to the appropriate level of care.  Outcome: Progressing  Flowsheets (Taken 08/01/2022 1509)  Standard of Care: Pre-Op [facility]

## 2022-08-01 NOTE — Anesthesia Postprocedure Evaluation (Signed)
Anesthesia Post Note    Patient: Keith Cross    Procedure(s) Performed: Procedure(s) with comments:  T3 OP Right thyroid lobectomy, - NIMS monitoring      Final anesthesia type: General    Patient location: PACU    Post anesthesia pain: adequate analgesia    Mental status: awake, alert  and oriented    Airway Patent: Yes    Last Vitals:    Vitals Value Taken Time   BP 134/76 08/01/22 1930   Temp 36.4 C 08/01/22 1930   Pulse 76 08/01/22 1930   Resp 13 08/01/22 1930   SpO2 98 % 08/01/22 1930        Temperature >35.5: Yes    Post vital signs: stable    Hydration: adequate    N/V:no    Plan of care per primary team.

## 2022-08-01 NOTE — Anesthesia Procedure Notes (Signed)
Intubation    Date/Time: 08/01/2022 4:06 PM    Performed by: Lysle Pearl, MD  Authorized by: Lysle Pearl, MD    Procedure Details:     Pre-oxygenated: Yes      Induction:  IV    Mask Ventilation:  Difficult (red oa)    Blade:  Glidescope 3    Blade (2nd attempt):     View Grade:  1    View Grade (2nd attempt):     Tube type:  Oral cuffed    Tube type (2nd attempt):     Tube size (mm):  7.5    Tube size (mm) (2nd attempt):     Tube Secured at (cm):  23 cm at teeth, bilateral breath sounds heard, inomed laryngeal effect tube taped onto ett and confirmed with Dr. Ronny Bacon Surgery    Cricoid pressure: Yes      Stylet: Yes      Bougie Used: No      Dental damage: No      Lip/Other damage: No      Bite block inserted:  Gauze    ET-CO2 present: Yes      Breath sounds equal: Yes      Eyes taped: Yes    Patient has difficult airway?  No

## 2022-08-02 NOTE — Telephone Encounter (Signed)
Addressed in clinic

## 2022-08-05 ENCOUNTER — Telehealth: Payer: Self-pay | Admitting: Surgery

## 2022-08-05 ENCOUNTER — Telehealth: Payer: Self-pay

## 2022-08-05 NOTE — Telephone Encounter (Signed)
Spoke with patient's daughter and she didn't really know about patient's condition just worried about the surgical swelling sign. Explained about expecting a little bit surgical site after surgery. verbalized understanding. Will call patient and check the condition.  Provided call back number [phone] for any questions.  Zalaya Astarita(Judy), RN

## 2022-08-05 NOTE — Telephone Encounter (Signed)
Daughter returned  judy 's call. Made an attempt to call clinic. Please call back to assist.

## 2022-08-05 NOTE — Telephone Encounter (Signed)
Called the patient via [name] interpreter [phone]) and left the voice mail to check patient's condition. Provided call back number [phone] if you have difficult breathing or hard to swallow sign and any other questions.  Mcadoo Muzquiz(Judy), RN

## 2022-08-07 NOTE — Progress Notes (Addendum)
I had the pleasure of seeing Keith Cross at the [facility] for follow up of OSA on PAP. He is a 61 year old male with PMH of cerebral cavernoma, thyroid [place] s/p R lobectomy, and OSA who has been undergoing management with PAP treatment. He was diagnosed with OSA on PSG on 06/07/21 with AHI 57.8/hr and O2 nadir 53%. This visit was conducted with [name], [name]    Machine: Keith Cross 11  Mask: F&P Evora FFM size L   DME: [employer]    Interim History  Since the last visit, Keith Cross reports he cleans his CPAP mask every 2 weeks usually. He has not received replacement PAP equipment yet.  He was not able to wear his PAP for about 2 weeks because of surgery for his thyroid though. It is difficult for him to put the equipment back on after getting up to urinate at night. He would like to know if he needs to wear PAP the rest of his life and is interested in alternatives to PAP therapy. He was previously given what an oral appliance and would like to know if he can wear that instead of PAP     Epworth Sleep Scale Score: 17    FOSQ Score: 11    PAP Download 07/07/22 to 08/05/22  Type of PAP  Type of PAP:: Auto PAP  Pressure  PAP pressure: 5-15  DATE  Start Date: 08/07/21  Download period  Number of days: 30  Percentage greater than 4 hours  % > 4 Hrs: 40  Average number of hrs per night when using  Average hours: 6 HRS 21 MINS  AHI  Residual: 3.3  CPAP Compliance  Days Used: 13 Days  Total Days in Possession: 30 Days  Percentage of Days Used: 43.33 %  Leaks: 31.4 liters/hour        Review of Systems  10/14 systems reviewed, all negative except where mentioned in HPI or below:      Physical Exam  BP 120/79   Pulse 109   Ht 5' 6.93" (1.7 m)   Wt 73.8 kg (162 lb 11.2 oz)   SpO2 100%   BMI 25.54 kg/m    General/Constitutional: Well developed, well nourished male, who looks at stated age of 61 year old. No acute distress.   Neurological: Alert and Oriented x 3. Cranial  nerves II through XII are grossly intact. Full range of motion throughout. Normal base and station.    Psychiatric: Normal mental status, mood is euthymic      Assessment  Keith Cross is a 61 year old male with PMH of cerebral cavernoma, thyroid [place] s/p R lobectomy, and OSA who presents today for follow up of OSA currently on device therapy. He is experiencing benefit from ongoing PAP therapy. He would like to know if his oral device is a reasonable replacement for PAP therapy     #. Obstructive Sleep Apnea: baseline very severe with AHI 57.8/hr and O2 nadir 53%. He is benefiting from therapy as evidenced by residual AHI 3.3/hr on PAP. He was unable to use therapy consistently for a brief period due to other health issues     #. Sleepiness: Epworth Sleep Scale Score: 17      Plan  #. HSAT ordered to be done with MAD to assess therapy efficacy  #. Treatment options for OSA were discussed including oral appliance and HNS. Mechanism of action and expectation of usage and  therapy efficacy of alternate therapies were briefly discussed  #. Continue wearing PAP during all hours of sleep in interim   #. Remember to regularly clean PAP mask as insufficient cleaning may contribute to increased leak   #. Additional samples of Evora FFM size L masks provided   #. Follow up with NP after sleep study results or sooner if needed. Contact clinic if any questions arise prior to the next visit.     Sincerely,  Keith Barban, NP      I have personally provided 25  minutes of clinical care time. Time includes review of therapy data, clarification of symptoms, troubleshooting issues, communication to arrange next steps and documentation of the above.

## 2022-08-08 ENCOUNTER — Ambulatory Visit (INDEPENDENT_AMBULATORY_CARE_PROVIDER_SITE_OTHER): Payer: BLUE CROSS/BLUE SHIELD | Admitting: Nurse Practitioner

## 2022-08-08 VITALS — BP 120/79 | HR 109 | Ht 66.93 in | Wt 162.7 lb

## 2022-08-08 DIAGNOSIS — G4719 Other hypersomnia: Secondary | ICD-10-CM

## 2022-08-08 DIAGNOSIS — G4733 Obstructive sleep apnea (adult) (pediatric): Secondary | ICD-10-CM

## 2022-08-08 DIAGNOSIS — Z7189 Other specified counseling: Secondary | ICD-10-CM

## 2022-08-08 LAB — PATHOLOGY TISSUE EXAM - ~~LOC~~
ADDITIONAL DIMENSION IN CENTIMETERS (CM): 0.3
ADDITIONAL DIMENSION IN CENTIMETERS (CM): 0.5
COMMENT(S): POSITIVE
Immunohistochemistry Interpretation: POSITIVE
MARGIN STATUS (NOTE L): NEGATIVE

## 2022-08-13 NOTE — Progress Notes (Signed)
Endocrine Surgery Post Operative Visit    Visit Date:  08/15/2022    [name] Interpretor # [id number]      HPI:  Keith Cross is a 61 year old year old male with PMH significant for CVA 2015, CAD, hypertension, hyperlipidemia, Type 2 DM, OSA on CPAP.     S/p right thyroid lobectomy on 08/01/22 for a 1.3 cm right thyroid nodule c/w papillary thyroid carcinoma. Procedure was uneventful and he was discharged home the same day.    Presents today for post op visit. Some mild swelling at incision. Tolerating PO.     Physical Exam:  Vital Signs:  BP 132/82 (BP Location: Left arm, BP Patient Position: Sitting, BP cuff size: Regular)   Pulse 87   Temp 97.7 F (36.5 C) (Tympanic)   Resp 17   Ht 5' 6.93" (1.7 m)   Wt 74.9 kg (165 lb 2 oz)   BMI 25.92 kg/m     General: no acute distress, voice clear  Neck: steri strips removed,  CV: RRR  Pulm: non-labored breathing  Neuro: no focal deficits      Pathology:    Surgical Pathology                                Case: [id number]                                   Authorizing Provider:  Murriel Hopper, MD         Collected:           08/01/2022 05:53 PM           Ordering Location:     [facility]                    Received:            08/02/2022 09:03 AM           Pathologist:           Irving Copas, MD                                                           Specimen:    Thyroid, Right Lobe, RIGHT THYROID LOBE SHORT STITCH- ISTHMUS, LONG STITCH- RIGHT                    UPPER POLE                                                                                Final Diagnosis   A.         RIGHT THYROID LOBE, LOBECTOMY:  -           Papillary thyroid carcinoma of the right lobe (1 cm), limited to the thyroid.  -  No obvious lymphovascular invasion seen (see CAP Cancer Protocol Summary below).  -           Surrounding multinodular hyperplastic parenchyma.      Electronically signed by Baron Hamper, MD for Irving Copas, MD on 08/08/2022 at [place]   8th Edition - Protocol posted: 3/22/2023THYROID GLAND: RESECTION - All Specimens  SPECIMEN   Procedure  Right lobectomy   TUMOR   Tumor Focality  Unifocal   Tumor Characteristics     Tumor Site  Right lobe   Tumor Size  Greatest Dimension (Centimeters): 1 cm   Additional Dimension (Centimeters)  0.5 cm     0.3 cm   Histologic Tumor Types and Subtypes  Papillary carcinoma, classic subtype   Tumor Proliferative Activity     Ki-67 Labeling Index  >3 %   Methodology  Manual count   Tumor Necrosis  Not identified   Angioinvasion (vascular invasion)  Not identified   Lymphatic Invasion  Not identified   Perineural Invasion  Not identified   Extrathyroidal Extension  Not identified   Margin Status  All margins negative for carcinoma   Distance from Invasive Carcinoma to Closest Margin  At least: 1 mm   Margin Comment  Right posterior (Block: A1)   REGIONAL LYMPH NODES   Regional Lymph Node Status  Not applicable (no regional lymph nodes submitted or found)   pTNM CLASSIFICATION (AJCC 8th Edition)   Reporting of pT, pN, and (when applicable) pM categories is based on information available to the pathologist at the time the report is issued. As per the AJCC (Chapter 1, 8th Ed.) it is the managing physician's responsibility to establish the final pathologic stage based upon all pertinent information, including but potentially not limited to this pathology report.       pT Category pT1a   pN Category pN not assigned (no nodes submitted or found)   Comment(s)  BRAF positive   .      Immunohistochemistry Interpretation    A2        BRAF                Positive              ERG                  Positive in endothelial cells              Ki-67                Slightly elevated: 3~5% of tumor cells          Assessment:  Keith Cross is a 61 year old year old male with PMH significant for CVA 2015, CAD, hypertension, hyperlipidemia, Type 2 DM, OSA on CPAP.     S/p right thyroid lobectomy on 08/01/22 for  a 1.3 cm right thyroid nodule c/w papillary thyroid carcinoma. Procedure was uneventful and he was discharged home the same day.    Pathology c/w 1 cm right sided papillary thyroid carcinoma, classic type, (-) margins, (-) extrathyroidal extension, (-) lymphovascular invasion, (+) BRAF.      Plan:    Pathology reviewed with patient. Given low risk features, does not appear a completion thyroidectomy is indicated at this time  Recommend surveillance thyroid US in 6 months  Follow up with Dr. Kathleene Hazel in 4-6 weeks to check thyroid function tests and determine need for thyroid hormone replacement  Follow up with me in 6 months to review thyroid US      Cahterine Heinzel L. Ronny Bacon, MD  Assistant Clinical Professor of [place] [employer]

## 2022-08-14 NOTE — Progress Notes (Signed)
[facility] Note    Patient: Keith Cross  Patient DOB: 25-Oct-1961  Patient ID: [id number]  Patient PCP: Emmaline Kluver  Date of Service: 08/19/2022    Reason for Visit  Follow Up       HPI  Keith Cross is a 61 year old male    Interval History  Patients last seen 07/22/22    S/p thyroidectomy 08/01/22    Blood pressures improved at home.    Still having some chest discomfort with exertion      Problem List  Patient Active Problem List    Diagnosis Date Noted   . Papillary thyroid carcinoma (CMS-HCC) 07/25/2022   . Cerebral cavernoma 07/04/2021   . OSA (obstructive sleep apnea) 04/17/2021   . Chest pain 03/27/2021   . Abnormal stress echo 03/27/2021        PMH  Past Medical History:   Diagnosis Date   . History of CVA (cerebrovascular accident)    . Hyperlipidemia    . Hypertension    . Type 2 diabetes mellitus (CMS-HCC)        Meds  Current Outpatient Medications   Medication Sig   . acetaminophen (TYLENOL) 325 MG tablet Take 2 tablets (650 mg) by mouth.   Marland Kitchen albuterol 108 (90 Base) MCG/ACT inhaler TAKE 2 PUFFS EVERY 4 HOURS   . allopurinol (ZYLOPRIM) 300 MG tablet Take 1 tablet (300 mg) by mouth daily.   Marland Kitchen amLODIPINE (NORVASC) 10 MG tablet Take 1 tablet (10 mg) by mouth daily.   Marland Kitchen aspirin 81 MG EC tablet Take 1 tablet (81 mg) by mouth daily.   Marland Kitchen atorvastatin (LIPITOR) 80 MG tablet Take 1 tablet (80 mg) by mouth daily.   . Blood Glucose Monitoring Suppl (TRUE METRIX METER) w/Device KIT As Directed.   . carvedilol (COREG) 25 MG tablet Take 1 tablet (25 mg) by mouth 2 times daily (with meals).   . colchicine 0.6 MG tablet TAKE 2 TABLETS NOW AND TAKE 1 TABLET 1 HOUR LATER FOR 1 DAY THEN TWICE A DAY   . empagliflozin (JARDIANCE) 25 mg tablet Take 1 tablet (25 mg) by mouth daily.   Marland Kitchen ezetimibe (ZETIA) 10 MG tablet Take 1 tablet (10 mg) by mouth daily.   . furosemide (LASIX) 40 MG tablet TAKE 1 TABLET BY MOUTH EVERY DAY IN THE MORNING   . galcanezumab-gnlm (EMGALITY) 120 mg/mL injection pen Inject 1 mL (120 mg)  under the skin every 30 days.   . hydrALAZINE (APRESOLINE) 25 MG tablet Take 1 tablet (25 mg) by mouth as needed (Take if SBP>160, up to twice daily).   . meclizine (ANTIVERT) 25 MG tablet    . metFORMIN (GLUCOPHAGE) 1000 mg tablet Take 1 tablet (1,000 mg) by mouth 2 times daily.   . nitroGLYcerin (NITRODUR) 0.1 MG/HR patch    . ondansetron (ZOFRAN ODT) 4 MG disintegrating tablet TAKE 1 TABLET BY MOUTH EVERY 6 HOURS AS NEEDED FOR NAUSEA OR VOMITING.   . pantoprazole (PROTONIX) 40 MG tablet Take 1 tablet (40 mg) by mouth daily.   . pioglitazone (ACTOS) 30 mg tablet Take 1 tablet (30 mg) by mouth.   . potassium chloride (KLOR-CON M20) 20 MEQ tablet TAKE 1 TABLET DAILY IN THE MORNING   . rimegepant (NURTEC ODT) tablet Dissolve 1 tablet (75 mg) on or under the tongue daily as needed (to treat migraines). Do not take more than one tablet per day.   . topiramate (TOPAMAX) 50 MG tablet Take 1 tablet (  50 mg) by mouth 2 times daily.   . traMADol (ULTRAM) 50 MG tablet Take 1 tablet (50 mg) by mouth every 6 hours as needed for Moderate Pain (Pain Score 4-6).   Marland Kitchen TRUE METRIX BLOOD GLUCOSE TEST test strip CHECK BLOOD SUGAR ONCE A DAY   . TRUEplus Lancets 30G MISC CHECK BLOOD SUGAR ONCE A DAY   . venlafaxine XR (EFFEXOR XR) 75 MG capsule Take 1 capsule (75 mg) by mouth daily.     Current Facility-Administered Medications   Medication   . nitroGLYcerin (NITROLINGUAL) spray 1 spray       All  No Known Allergies    ROS  A comprehensive review of 12 organ systems was performed and reviewed with the patient. Review of systems with the patient was negative except as indicated in the HPI above.    SH  Social History     Socioeconomic History   . Marital status: Married     Spouse name: Not on file   . Number of children: Not on file   . Years of education: Not on file   . Highest education level: Not on file   Occupational History   . Not on file   Tobacco Use   . Smoking status: Never   . Smokeless tobacco: Never   Substance and Sexual  Activity   . Alcohol use: Not Currently   . Drug use: Not on file   . Sexual activity: Not on file   Other Topics Concern   . Not on file   Social History Narrative   . Not on file     Social Determinants of Health     Financial Resource Strain: Not on file   Food Insecurity: Not on file   Transportation Needs: Not on file   Physical Activity: Not on file   Stress: Not on file   Social Connections: Not on file   Intimate Partner Violence: Not on file   Housing Stability: Not on file     FH  No family history on file.    Past Surgical History:  No date: Left knee surgery     Physical Exam   08/19/22  1309   BP: 108/75   Pulse: (!) 121     Gen: pleasant, in NAD  Neuro: A&Ox3, no focal deficit  HEENT:  NC/AT. MMM  Neck: no bruit. JVD   CV: regular rate, normal rhythm, S1 S2   Resp: clear, no rales  Abd:  NABS soft ntnd  Ext: no c/c/e  Vasc: 2+ radial, DP pulses. Cap refill < 3 sec. Periphery warm    Labs  Lab Results   Component Value Date    RBC 5.07 07/29/2022    HGB 14.4 07/29/2022    HCT 44.9 07/29/2022    MCV 88.6 07/29/2022    MCHC 32.0 07/29/2022    RDW 15.2 (H) 07/29/2022    PLT 271 07/29/2022     Lab Results   Component Value Date    BUN 13 07/29/2022    CREAT 1.0 07/29/2022    CL 108 (H) 07/29/2022    K 4.2 07/29/2022    [place] 9.3 07/29/2022    TBILI 0.6 07/29/2022    ALB 4.1 (L) 07/29/2022    AST 23 07/29/2022    ALK 51 07/29/2022    ALT 49 07/29/2022    GLU 196 (H) 07/29/2022     Lab Results   Component Value Date    INR 0.94 07/29/2022  PTT 28.5 07/29/2022       Lab Results   Component Value Date    BNP 21 03/27/2021     Lab Results   Component Value Date    A1C 6.5 (H) 07/28/2022     Lab Results   Component Value Date    CHOL 97 07/28/2022    LDLCALC 37 07/28/2022    TRIG 46 07/28/2022       Other Diagnostics  Coronary angiogram:  [facility] on 03/27/2021:  CONCLUSIONS:   1. Mid LAD with 50% stenosis. RFR of mid LAD was 0.95. This was physiologically non-significant.   2. LVEDP 20-25 mm Hg.     RECOMMENDATIONS:    1. Maximize medical therapy.   2. Aggressive risk factor modification efforts.   3. Monitor vitals and arterial access site/pulses.    Echocardiogram 03/27/21:   1. Mildly decreased left ventricular systolic function. The left ventricular ejection fraction is 51% by Biplane.   2. Left ventricular ejection fraction is 53% by 3D.   3. Mild concentric left ventricular hypertrophy.   4. Normal LV filling pressures.   5. Normal right ventricular size and systolic function.    Exercise stress echocardiogram 03/27/21:   1. Positive exercise stress test for ischemia.   2. Patient developed chest pain at peak excercise that improved with recovery and rest.   3. Stage 1: 1: Mid and apical inferior wall is abnormal.      Stage 2: 2: Apical inferior segment is abnormal.   4. The peak estimated pulmonary artery systolic pressure during exercise was indeterminate.   5. Average functional capacity.    Assessment and plan:   Keith Cross is a 61 year old male    #Hypertension  -Suspect significant improvement due to initiating CPAP for sleep apnea  -Continue carvedilol 25 mg BID, nitroglycerin patch 0.1 mg/hr for now.  Add hydralazine 25 mg PRN for SBP>160  -Re-add amlodipine 10 mg daily for both BP and anti-anginal effect  -Prior secondary hypertension work-up negative for hyperaldosteronism and pheochromocytoma.  Normal creatinine.  His left arm blood pressure was actually higher than his right arm blood pressure which does not suggest coarctation of the aorta.    -Bilateral renal artery stenosis unlikely with normal creatinine on ARB, no hemodynamically significant renal artery stenosis on duplex US  -Following with sleep medicine for treatment of severe OSA    #Coronary artery disease  -Moderate non obstructive disease found on coronary angiogram  -Aspirin 81 mg daily, carvedilol 25 mg BID, atorvastatin 80 mg daily, amlodipine 10 mg daily, nitroglycerin 0.1 mg/hr patch  -He had abnormal exercise stress echocardiogram and was  referred for coronary angiogram as above which showed non obstructive disease  -Consider microvascular disease?  Control blood pressure as above.  Nitroglycerin 0.1 mg/hr patch.    -Will refer to cardiac rehab to improve exertional capacity    #Hyperlipidemia  -Goal LDL<70 mg/dL given CAD  -FLP 04/17/21: tc 167, hdl 47, ldL 93, TG 137  -FLP 06/19/21: TC 97, HDL 44, LDL 33, TG 98  -FLP 05/15/22: TC 170, HDL 60, LDL 92, TG 91  -FLP 07/28/22: TC 97, HDL 41, LDL 37, TG 46  -Atorvastatin 80 mg daily, ezetimibe 10 mg daily  -Now at goal, continue    #Valve issue?  -Unclear issue with the valve.  TTE shows no significant valvular heart disease.  He has TTE in our system from 2016 showing aortic sclerosis with no other signficant valve disease  RTC in four months    I spent greater than 30 minutes on the day of the encounter reviewing the patient's diagnostic tests, performing a history and physical exam, writing orders, educating the patient, coordinating patient care with the staff, and documenting in the electronic medical records system.             ICD-10-CM ICD-9-CM    1. Benign hypertensive heart disease without heart failure  I11.9 402.10       2. Hyperlipidemia, unspecified hyperlipidemia type  E78.5 272.4       3. Chest pain, unspecified type  R07.9 786.50       4. Coronary artery disease involving native coronary artery of native heart without angina pectoris  I25.10 414.01       5. Aortic valve disorder  I35.9 424.1            There are no Patient Instructions on file for this visit.    Paul Half, MD  Health Sciences Assistant Clinical Professor  Division of Cardiology  Department of [place]

## 2022-08-15 ENCOUNTER — Ambulatory Visit: Payer: BLUE CROSS/BLUE SHIELD | Attending: Surgery | Admitting: Surgery

## 2022-08-15 ENCOUNTER — Encounter: Payer: Self-pay | Admitting: Surgery

## 2022-08-15 VITALS — BP 132/82 | HR 87 | Temp 97.7°F | Resp 17 | Ht 66.93 in | Wt 165.1 lb

## 2022-08-15 DIAGNOSIS — C73 Malignant neoplasm of thyroid gland: Secondary | ICD-10-CM | POA: Insufficient documentation

## 2022-08-15 NOTE — Patient Instructions (Addendum)
Follow on 02/13/2023 @ 845 am with Dr. Ronny Bacon  Please call radiology dept at [phone] to schedule Head and Neck ultrasound appointment.    Please do the scan before seeing Dr Ronny Bacon in clinic.

## 2022-08-19 ENCOUNTER — Ambulatory Visit: Payer: BLUE CROSS/BLUE SHIELD | Attending: Cardiovascular Disease | Admitting: Cardiovascular Disease

## 2022-08-19 VITALS — BP 108/75 | HR 121 | Wt 167.0 lb

## 2022-08-19 DIAGNOSIS — I359 Nonrheumatic aortic valve disorder, unspecified: Secondary | ICD-10-CM | POA: Insufficient documentation

## 2022-08-19 DIAGNOSIS — Z6826 Body mass index (BMI) 26.0-26.9, adult: Secondary | ICD-10-CM | POA: Insufficient documentation

## 2022-08-19 DIAGNOSIS — E785 Hyperlipidemia, unspecified: Secondary | ICD-10-CM | POA: Insufficient documentation

## 2022-08-19 DIAGNOSIS — I119 Hypertensive heart disease without heart failure: Secondary | ICD-10-CM | POA: Insufficient documentation

## 2022-08-19 DIAGNOSIS — I2510 Atherosclerotic heart disease of native coronary artery without angina pectoris: Secondary | ICD-10-CM | POA: Insufficient documentation

## 2022-08-19 DIAGNOSIS — R079 Chest pain, unspecified: Secondary | ICD-10-CM | POA: Insufficient documentation

## 2022-08-19 LAB — ECG 12-LEAD
P AXIS: 50 Deg
PR INTERVAL: 149 ms
QRS INTERVAL/DURATION: 102 ms
QT: 411 ms
QTc (Bazett): 428 ms
R AXIS: 19 Deg
R-R INTERVAL AVERAGE: 881 ms
T AXIS: 16 Deg
VENTRICULAR RATE: 68 {beats}/min

## 2022-09-03 ENCOUNTER — Ambulatory Visit
Payer: BLUE CROSS/BLUE SHIELD | Attending: Student in an Organized Health Care Education/Training Program | Admitting: Student in an Organized Health Care Education/Training Program

## 2022-09-03 VITALS — BP 157/98 | HR 78 | Ht 66.0 in | Wt 172.8 lb

## 2022-09-03 DIAGNOSIS — C73 Malignant neoplasm of thyroid gland: Secondary | ICD-10-CM | POA: Insufficient documentation

## 2022-09-03 DIAGNOSIS — Z6827 Body mass index (BMI) 27.0-27.9, adult: Secondary | ICD-10-CM | POA: Insufficient documentation

## 2022-09-03 NOTE — Progress Notes (Signed)
ENDOCRINOLOGY OUTPATIENT VISIT - [facility]    Date and Time of Evaluation:  Wednesday September 03, 2022    Primary Care Physician:  Glenford Peers [facility]     Chief Complaint:   No chief complaint on file.      History of Present Illness:    Hasnain Manheim is a 61 year old male with CVA 2015, HTN, HLD, and T2DM, here for evaluation and management of adrenal nodule.    1. Adrenal nodule.    Subcentimeter adrenal nodule (0.9 cm) on 10/2020 CTAP with 28 HU and absolute/relative contrast washout of 74%/42%, c/w benign adrenal adenoma.     Was seen by Dr. Inetta Fermo of Endocrine Surgery given history of uncontrolled hypertension and previous hormonal w/up with elevated plasma metanephrines 62 (< 57) and normetanephrines 252 (< 148), though less than 2x ULN. Aldosterone level was normal at 8. Repeat labs with Dr. Ronny Bacon with unremarkable plasma metanephrines (normetanephrines very minimally elevated at 0.98, normal < 0.9). 24-hour urine metanephrines were wnl. Aldosterone/renin wnl with normal dexamethasone suppression test.      Currently on multiple hypertensive agents, including clonidine, carvedilol, hydralazine, isosorbide mononitrate, amlodipine, losartan, metoprolol, and furosemide. BP 181/108 in clinic today.     Patient does endorse headaches in the morning and late evening. He does not have a Neurologist. Also endorses episodes of palpitations, diaphoresis, SOB, and chest pain. Advised patient he needs to present to ER if he has these symptoms. Has Cardiology referral. Reports diarrhea with mucus in stool once every few weeks.     Also had thyroid US, read pending.      05/07/2022 follow up:    Visit completed with [name] interpretor ([employer])     Thyroid US with right 1.3 cm TR 5 nodule. Amenable to biopsy.     BP improving now that he has started CPAP for OSA.     Still having intermittent chest pain. Following with cardiology.     Following with neurology for cerebral cavernoma.     07/17/2022 follow up:    Patient  joined by daughter and wife on call.    Discussed that 06/2022 thyroid FNA c/w PTC and that patient will require surgery for treatment, which he is amenable to.     09/03/2022 follow up:    S/p lobectomy 08/01/2022 with Dr. 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 No plan for completion at this time.    More tired. Waking up overnight to use the restroom. Feels cold. Weight is stable. No hair, skin, or nail changes. No constipation or diarrhea. No tremors, palpitations, anxiety. A little trouble swallowing. No hoarseness.      Does endorse ongoing headache and SOB which preceeded surgery. BPs remain labile.    Past Medical/Surgical History:  Past Medical History:   Diagnosis Date    History of CVA (cerebrovascular accident)     Hyperlipidemia     Hypertension     Type 2 diabetes mellitus (CMS-HCC)        Family History:  No family history on file.    Social History:  Social History     Socioeconomic History    Marital status: Married   Tobacco Use    Smoking status: Never    Smokeless tobacco: Never   Substance and Sexual Activity    Alcohol use: Not Currently       Allergies:  No Known Allergies    Medications:  Current Outpatient Medications on File Prior to Visit  Medication Sig Dispense Refill    acetaminophen (TYLENOL) 325 MG tablet Take 2 tablets (650 mg) by mouth.      albuterol 108 (90 Base) MCG/ACT inhaler TAKE 2 PUFFS EVERY 4 HOURS      allopurinol (ZYLOPRIM) 300 MG tablet Take 1 tablet (300 mg) by mouth daily.      amLODIPINE (NORVASC) 10 MG tablet Take 1 tablet (10 mg) by mouth daily. 90 tablet 3    aspirin 81 MG EC tablet Take 1 tablet (81 mg) by mouth daily. 90 tablet 3    atorvastatin (LIPITOR) 80 MG tablet Take 1 tablet (80 mg) by mouth daily. 90 tablet 3    Blood Glucose Monitoring Suppl (TRUE METRIX METER) w/Device KIT As Directed.      carvedilol (COREG) 25 MG tablet Take 1 tablet (25 mg) by mouth 2 times daily (with meals). 180 tablet 3    colchicine 0.6 MG tablet TAKE 2 TABLETS NOW  AND TAKE 1 TABLET 1 HOUR LATER FOR 1 DAY THEN TWICE A DAY      empagliflozin (JARDIANCE) 25 mg tablet Take 1 tablet (25 mg) by mouth daily. 90 tablet 3    ezetimibe (ZETIA) 10 MG tablet Take 1 tablet (10 mg) by mouth daily. 90 tablet 3    furosemide (LASIX) 40 MG tablet TAKE 1 TABLET BY MOUTH EVERY DAY IN THE MORNING      galcanezumab-gnlm (EMGALITY) 120 mg/mL injection pen Inject 1 mL (120 mg) under the skin every 30 days. 1 mL 11    hydrALAZINE (APRESOLINE) 25 MG tablet Take 1 tablet (25 mg) by mouth as needed (Take if SBP>160, up to twice daily). 90 tablet 3    meclizine (ANTIVERT) 25 MG tablet       metFORMIN (GLUCOPHAGE) 1000 mg tablet Take 1 tablet (1,000 mg) by mouth 2 times daily.      nitroGLYcerin (NITRODUR) 0.1 MG/HR patch       ondansetron (ZOFRAN ODT) 4 MG disintegrating tablet TAKE 1 TABLET BY MOUTH EVERY 6 HOURS AS NEEDED FOR NAUSEA OR VOMITING.      pantoprazole (PROTONIX) 40 MG tablet Take 1 tablet (40 mg) by mouth daily.      pioglitazone (ACTOS) 30 mg tablet Take 1 tablet (30 mg) by mouth.      potassium chloride (KLOR-CON M20) 20 MEQ tablet TAKE 1 TABLET DAILY IN THE MORNING      rimegepant (NURTEC ODT) tablet Dissolve 1 tablet (75 mg) on or under the tongue daily as needed (to treat migraines). Do not take more than one tablet per day. 16 tablet 11    topiramate (TOPAMAX) 50 MG tablet Take 1 tablet (50 mg) by mouth 2 times daily.      traMADol (ULTRAM) 50 MG tablet Take 1 tablet (50 mg) by mouth every 6 hours as needed for Moderate Pain (Pain Score 4-6). 12 tablet 0    TRUE METRIX BLOOD GLUCOSE TEST test strip CHECK BLOOD SUGAR ONCE A DAY      TRUEplus Lancets 30G MISC CHECK BLOOD SUGAR ONCE A DAY      venlafaxine XR (EFFEXOR XR) 75 MG capsule Take 1 capsule (75 mg) by mouth daily. 90 capsule 1     Current Facility-Administered Medications on File Prior to Visit   Medication Dose Route Frequency Provider Last Rate Last Admin    nitroGLYcerin (NITROLINGUAL) spray 1 spray  1 spray Sublingual Q5  Min PRN Paul Half, MD   3 spray at 03/27/21 1439  Physical Exam:  Vital Signs: BP (!) 157/98 (BP Location: Right arm, BP Patient Position: Sitting)   Pulse 78   Ht '5\' 6"'$  (1.676 m)   Wt 78.4 kg (172 lb 13.5 oz)   BMI 27.90 kg/m     General:  No acute distress  HEENT: No proptosis, lid lag, or periorbital edema. No scleral icterus   Lungs: Symmetric chest rise, normal work of breathing  Neurological: Alerted and oriented x 4  Skin:  No visible lesions  Psych: Appropriate mood and affect    Diagnostic Data:    Laboratory Data:    Lab Results   Component Value Date    HGB 14.4 07/29/2022    HCT 44.9 07/29/2022    PLT 271 07/29/2022    RBC 5.07 07/29/2022    MCV 88.6 07/29/2022    RDW 15.2 (H) 07/29/2022    NEUTP 56.2 07/29/2022    NEUTAB 3.5 07/29/2022    EOSAB 0.3 07/29/2022       Lab Results   Component Value Date/Time    SODIUM 141 07/29/2022 10:55 AM    K 4.2 07/29/2022 10:55 AM    CL 108 (H) 07/29/2022 10:55 AM    CO2 24 07/29/2022 10:55 AM    BUN 13 07/29/2022 10:55 AM    CREAT 1.0 07/29/2022 10:55 AM    GLU 196 (H) 07/29/2022 10:55 AM    [place] 9.3 07/29/2022 10:55 AM    MG 2.0 03/28/2021 08:28 AM    PHOS 3.9 03/28/2021 08:28 AM    TPROT 6.7 07/29/2022 10:55 AM    ALB 4.1 (L) 07/29/2022 10:55 AM    ALK 51 07/29/2022 10:55 AM    TBILI 0.6 07/29/2022 10:55 AM    AST 23 07/29/2022 10:55 AM    ALT 49 07/29/2022 10:55 AM       Lab Results   Component Value Date/Time    A1C 6.5 (H) 07/28/2022 09:12 AM       Lab Results   Component Value Date/Time    TSHHS 0.806 07/29/2022 10:55 AM    FREET4 0.82 07/29/2022 10:55 AM     03/2022 Thyroid US    IMPRESSION:     1.  Indeterminate 1.3 cm TR 5 nodule in the right thyroid lobe. Recommend ultrasound-guided fine-needle aspiration for further evaluation.    06/18/2022 Thyroid FNA        07/2022 Right Lobectomy    A.         RIGHT THYROID LOBE, LOBECTOMY:  -           Papillary thyroid carcinoma of the right lobe (1 cm), limited to the thyroid.  -           No obvious  lymphovascular invasion seen (see CAP Cancer Protocol Summary below).  -           Surrounding multinodular hyperplastic parenchyma.      Electronically signed by Baron Hamper, MD for Irving Copas, MD on 08/08/2022 at 1256   Comment    Prior fine needle aspiration findings ([id number]) noted.  Multiple deeper levels were examined.      Synoptic Checklist   THYROID GLAND   8th Edition - Protocol posted: 3/22/2023THYROID GLAND: RESECTION - All Specimens  SPECIMEN   Procedure  Right lobectomy   TUMOR   Tumor Focality  Unifocal   Tumor Characteristics     Tumor Site  Right lobe   Tumor Size  Greatest Dimension (Centimeters): 1 cm   Additional  Dimension (Centimeters)  0.5 cm     0.3 cm   Histologic Tumor Types and Subtypes  Papillary carcinoma, classic subtype   Tumor Proliferative Activity     Ki-67 Labeling Index  >3 %   Methodology  Manual count   Tumor Necrosis  Not identified   Angioinvasion (vascular invasion)  Not identified   Lymphatic Invasion  Not identified   Perineural Invasion  Not identified   Extrathyroidal Extension  Not identified   Margin Status  All margins negative for carcinoma   Distance from Invasive Carcinoma to Closest Margin  At least: 1 mm   Margin Comment  Right posterior (Block: A1)   REGIONAL LYMPH NODES   Regional Lymph Node Status  Not applicable (no regional lymph nodes submitted or found)   pTNM CLASSIFICATION (AJCC 8th Edition)   Reporting of pT, pN, and (when applicable) pM categories is based on information available to the pathologist at the time the report is issued. As per the AJCC (Chapter 1, 8th Ed.) it is the managing physician's responsibility to establish the final pathologic stage based upon all pertinent information, including but potentially not limited to this pathology report.        pT Category  pT1a   pN Category  pN not assigned (no nodes submitted or found)   Comment(s)  BRAF positive   .      Immunohistochemistry Interpretation    A2        BRAF                 Positive              ERG                  Positive in endothelial cells              Ki-67                Slightly elevated: 3~5% of tumor cells         Impression:  Trueman Worlds is a 61 year old male with CVA 2015, HTN, HLD, and T2DM, here for evaluation and management of adrenal nodule.    # Papillary thyroid cancer  - 1.3 cm right TR 5 nodule s/p FNA c/w PTC in 06/2022  - S/p right lobectomy with Dr. Ronny Bacon with 1 cm unifocal classic PTC without concerning features  - No plan for completion at this time  - Check TFTs    # T2DM   - A1c 7.4%, goal < 8% without hypoglycemia given hx of CAD  - MTF 1g BID  - Jardiance 25 mg daily   - Actos 30 mg  - At goal, but if BG can be lowered without hypoglycemia it would be reasonable to consider an additional agent. We had a very extensive discussion with the translator re his PMH; no apparent hx of MTC/MEN, but initially endorsed pancreatitis, which he later clarified was a renal/GU issues, likely nephrolithiasis and possibly BPH. He would like to do some additional research into GLP-1RA before our next visit     # Adrenal nodule  - 0.9 cm with negative hormonal workup (normal aldosterone/renin, DST, and minimally elevated plasma normetanephrines <2x ULN) in 12/2020 with Dr. Inetta Fermo  - Repeat aldo/renin unremarkable, with minimally elevated plasma normetanephrines again in 01/2021; has not completed LNSC yet   - Subcentimeter adrenal nodules do not typically require surveillance, however given nearly 1 cm size will consider repeat CT scan.  If stable does not require regular monitoring.      #HTN  - Refractory despite multiple antihypertensives  - Negative workup for hyperaldosteronism and pheochromocytoma.   - On CPAP  - Following with cardiology    Future Appointments   Date Time Provider [place]   09/10/2022  4:00 PM [place]   09/26/2022  4:00 PM Serafina Mitchell, NP [id number] [place]   10/16/2022 11:45 AM Simonne Come, MD [place] [id number]  [id number]   12/03/2022  2:30 PM Ichael Pullara, Betsey Holiday, MD [place] PLZEND2 [facility]OT[facility]   12/23/2022  2:30 PM Paul Half, MD [place] [facility] -[facility]OT[facility]   02/13/2023  8:45 AM Murriel Hopper, MD  [facility] [place]     Vernell Leep, MD

## 2022-09-10 ENCOUNTER — Ambulatory Visit (INDEPENDENT_AMBULATORY_CARE_PROVIDER_SITE_OTHER): Payer: BLUE CROSS/BLUE SHIELD

## 2022-09-10 DIAGNOSIS — G4733 Obstructive sleep apnea (adult) (pediatric): Secondary | ICD-10-CM

## 2022-09-10 NOTE — Progress Notes (Addendum)
HSAT NOTE        Keith Cross  a  61 year old male arrived for his appointment on time.    Reason for visit: Home Sleep Apnea Test (HSAT) he verbalizes understand that he is supposed to complete HSAT with Oral Appliance  setup with Nox T3 Serial Number: [id number]  Nonin WristOx SN: [id number]    Approximate time spent with patient: 15 minutes    Education was provided to the patient  Education topics covered: HSAT setup  Education provided via: Explanation, Handout, and Demonstration  Expressed understanding of education through:Return Demonstration, Verbal Confirmation of Understanding, and Asked questions (Answers Provided)    The patient understands that they are required to bring the device back: 09/11/22

## 2022-09-17 ENCOUNTER — Ambulatory Visit: Payer: BLUE CROSS/BLUE SHIELD | Admitting: Nurse Practitioner

## 2022-09-26 ENCOUNTER — Ambulatory Visit (INDEPENDENT_AMBULATORY_CARE_PROVIDER_SITE_OTHER): Payer: BLUE CROSS/BLUE SHIELD | Admitting: Nurse Practitioner

## 2022-09-26 VITALS — BP 163/99 | HR 81 | Ht 66.0 in | Wt 181.3 lb

## 2022-09-26 DIAGNOSIS — R0683 Snoring: Secondary | ICD-10-CM

## 2022-09-26 DIAGNOSIS — Z7189 Other specified counseling: Secondary | ICD-10-CM

## 2022-09-26 DIAGNOSIS — Z789 Other specified health status: Secondary | ICD-10-CM

## 2022-09-26 DIAGNOSIS — G4733 Obstructive sleep apnea (adult) (pediatric): Secondary | ICD-10-CM

## 2022-09-26 NOTE — Patient Instructions (Signed)
#.   Mandibular Advancement Device: also known as oral appliances, these work by moving your lower jaw forward when in place to increase airway opening. There are custom-made and over-the counter options, though over-the-counter options are typically more bulky. A referral can be placed to a dentist who specializes in this if a custom oral appliance is desired. Please be aware that insurance may not cover this. Please see below for some dentists in the area who specialize in these device:     Dr. Wynelle Link, DDS   Address: [address], [place], [place] [address]  Phone: [phone]    Dr. Moshe Salisbury, DDS & Demetra Shiner, DDS  Address 1: [address], [place], [place], [place] [address]  Address 2: [address] [place], [place] [address]  Phone: [phone]    Jillyn Ledger, DDS   Address: [address] [place], [place] [address]   Phone: [phone]   [email or web address]

## 2022-09-26 NOTE — Progress Notes (Signed)
I had the pleasure of seeing Keith Cross at the [facility] for follow up of sleep study results and OSA on PAP. He is a 61 year old male with PMH of cerebral cavernoma, thyroid [place] s/p R lobectomy, and OSA who has been undergoing management with PAP treatment. He was diagnosed with OSA on PSG on 06/07/21 with AHI 57.8/hr and O2 nadir 53%. He has had difficulty tolerating PAP and MAD was made to assess therapy efficacy     This visit was conducted with [name] (ID# [phone])    Machine: Resmed Airsense 11  Mask: F&P [place] FFM size L    DME: [employer]     Interim History  Since the last visit, Keith Cross reports he was not wearing a oral appliance the night of the HSAT. He clarified that he is planning on getting one made. He does not want to wear a PAP for apnea anymore. In the past he had had an oral appliance but he now explains that this was 10 years ago and he has since lost it.     Epworth Sleep Scale Score: 13    FOSQ Score: 11.5    09/10/22 HSAT Results  2. Baseline oxyhemoglobin saturation during waking at baseline was 93.   3. Study revealed overall Apnea-Hypopnea Index (AHI) of 66.0/hr with minimum oxyhemoglobin desaturation of 58.0%. Supine AHI of 65.3/h.   4. An average pulse rate of 75.4 bpm with a maximum pulse rate recorded at 112.0 bpm           PAP Download 08/24/22 to 09/22/22  Type of PAP  Type of PAP:: Auto PAP (AIRSENSE 11 AUTOSET)  Pressure  PAP pressure: 5-15  DATE  Start Date: 08/07/21  Download period  Number of days: 30  Percentage greater than 4 hours  % > 4 Hrs: 33  Average number of hrs per night when using  Average hours: 6 HRS 47 MINS  AHI  Residual: 2.9  CPAP Compliance  Days Used: 11 Days  Total Days in Possession: 30 Days  Percentage of Days Used: 36.67 %  Leaks: 27.3 liters/hour        Review of Systems  10/14 systems reviewed, all negative except where mentioned in HPI or below:      Physical Exam  BP (!) 163/99   Pulse 81   Ht '5\' 6"'$  (1.676 m)    Wt 82.2 kg (181 lb 5.3 oz)   SpO2 95%   BMI 29.27 kg/m    General/Constitutional: Well developed, well nourished male, who looks at stated age of 61 year old. No acute distress.   Neurological: Alert and Oriented x 3. Cranial nerves II through XII are grossly intact. Full range of motion throughout. Normal base and station.    Psychiatric: Normal mental status, mood is euthymic       Assessment  Keith Cross is a 61 year old male with PMH of cerebral cavernoma, thyroid [place] s/p R lobectomy, and OSA who presents today for follow up of OSA currently on device therapy and review of sleep study results that showed insufficient response and persistent severe OSA with MAD and snoring    #. Obstructive Sleep Apnea: baseline very severe with AHI 66/hr and O2 nadir 58%. He is benefiting from therapy as evidenced by residual AHI 2.9/hr on PAP but has difficulty tolerating it     #. Snoring     Plan  #. The risks of untreated sleep  apnea were reviewed and the functional and cardiovascular benefits of therapy were reviewed.   #. Discussed MAD therapy including mechanism of action, expectations for therapy, and possible limitations of efficacy relative to PAP. PAP therapy is recommended given severity of OSA, but patient requests a referral be made to try MAD  #. An order has been placed for MAD   #. Remember to wear PAP device at all times when sleeping while awaiting MAD   #. Please follow up after MAD has been made and at final adjustment so a repeat HSAT can be done to assess therapy efficacy    #. Follow up with NP after MAD has been made as above, or sooner if needed. Contact clinic if any questions arise prior to the next visit.     Sincerely,  Harold Barban, NP      I have personally provided 30 minutes of clinical care time. Time includes review of therapy data, clarification of symptoms, troubleshooting issues, communication to arrange next steps and documentation of the above.

## 2022-10-16 ENCOUNTER — Ambulatory Visit: Payer: BLUE CROSS/BLUE SHIELD | Admitting: Neurosurgery

## 2022-11-01 ENCOUNTER — Ambulatory Visit
Admit: 2022-11-01 | Discharge: 2022-11-01 | Disposition: A | Discharge status: Home / Self Care | Payer: BLUE CROSS/BLUE SHIELD | Attending: Radiology | Admitting: Radiology

## 2022-11-01 DIAGNOSIS — R9082 White matter disease, unspecified: Secondary | ICD-10-CM | POA: Insufficient documentation

## 2022-11-01 DIAGNOSIS — D1802 Hemangioma of intracranial structures: Secondary | ICD-10-CM

## 2022-11-01 DIAGNOSIS — Z8673 Personal history of transient ischemic attack (TIA), and cerebral infarction without residual deficits: Secondary | ICD-10-CM | POA: Insufficient documentation

## 2022-11-01 DIAGNOSIS — R531 Weakness: Secondary | ICD-10-CM | POA: Insufficient documentation

## 2022-12-03 ENCOUNTER — Ambulatory Visit
Payer: BC Managed Care – PPO | Attending: Student in an Organized Health Care Education/Training Program | Admitting: Student in an Organized Health Care Education/Training Program

## 2022-12-03 ENCOUNTER — Encounter: Payer: Self-pay | Admitting: Student in an Organized Health Care Education/Training Program

## 2022-12-03 ENCOUNTER — Other Ambulatory Visit (HOSPITAL_BASED_OUTPATIENT_CLINIC_OR_DEPARTMENT_OTHER)
Admission: RE | Admit: 2022-12-03 | Discharge: 2022-12-03 | Disposition: A | Discharge status: Home Health | Payer: BC Managed Care – PPO

## 2022-12-03 VITALS — Wt 176.4 lb

## 2022-12-03 DIAGNOSIS — E1159 Type 2 diabetes mellitus with other circulatory complications: Secondary | ICD-10-CM

## 2022-12-03 DIAGNOSIS — C73 Malignant neoplasm of thyroid gland: Secondary | ICD-10-CM

## 2022-12-03 DIAGNOSIS — E278 Other specified disorders of adrenal gland: Secondary | ICD-10-CM

## 2022-12-03 DIAGNOSIS — Z6828 Body mass index (BMI) 28.0-28.9, adult: Secondary | ICD-10-CM | POA: Insufficient documentation

## 2022-12-03 DIAGNOSIS — D3500 Benign neoplasm of unspecified adrenal gland: Secondary | ICD-10-CM

## 2022-12-03 LAB — COMPREHENSIVE METABOLIC PANEL, BLOOD
ALT: 31 U/L (ref 7–52)
AST: 20 U/L (ref 13–39)
Albumin: 4.4 G/DL (ref 4.2–5.5)
Alk Phos: 54 U/L (ref 34–104)
BUN: 18 mg/dL (ref 7–25)
Bilirubin, Total: 0.6 mg/dL (ref 0.0–1.4)
CO2: 24 mmol/L (ref 21–31)
Calcium: 9 mg/dL (ref 8.6–10.3)
Chloride: 106 mmol/L (ref 98–107)
Creat: 1.1 mg/dL (ref 0.7–1.3)
Electrolyte Balance: 8 mmol/L (ref 2–12)
Glucose: 142 mg/dL — ABNORMAL HIGH (ref 85–125)
Potassium: 3.8 mmol/L (ref 3.5–5.1)
Protein, Total: 7.1 G/DL (ref 6.0–8.3)
Sodium: 138 mmol/L (ref 136–145)
eGFR - high estimate: >60 (ref >59)
eGFR - low estimate: >60 (ref >59)

## 2022-12-03 LAB — FREE THYROXINE, BLOOD: Free T4: 1.02 ng/dL (ref 0.60–1.12)

## 2022-12-03 LAB — LIPID(CHOL FRACT) PANEL, BLOOD
Cholesterol: 176 MG/DL (ref <200)
HDL Cholesterol: 48 MG/DL (ref >40)
LDL Cholesterol (calc): 98 MG/DL (ref <160)
Non HDL Cholesterol (calculated): 128 MG/DL (ref <130)
Triglycerides: 149 MG/DL (ref <150)
VLDL Cholesterol (calculated): 30 MG/DL

## 2022-12-03 LAB — TSH, BLOOD: TSH, Ultrasensitive: 2.651 u[IU]/mL (ref 0.450–4.120)

## 2022-12-03 MED ORDER — MOUNJARO 15 MG/0.5ML SC SOPN
15.0000 mg | PEN_INJECTOR | SUBCUTANEOUS | 11 refills | Status: AC
Start: 2022-12-03 — End: ?

## 2022-12-03 MED ORDER — LOSARTAN POTASSIUM 25 MG OR TABS
25.0000 mg | ORAL_TABLET | Freq: Every day | ORAL | 3 refills | Status: DC
Start: 2022-12-03 — End: 2022-12-23

## 2022-12-03 MED ORDER — MOUNJARO 7.5 MG/0.5ML SC SOPN
7.5000 mg | PEN_INJECTOR | SUBCUTANEOUS | 0 refills | Status: AC
Start: 2022-12-03 — End: ?

## 2022-12-03 MED ORDER — MOUNJARO 2.5 MG/0.5ML SC SOPN
2.5000 mg | PEN_INJECTOR | SUBCUTANEOUS | 0 refills | Status: DC
Start: 2022-12-03 — End: 2023-04-28

## 2022-12-03 MED ORDER — MOUNJARO 5 MG/0.5ML SC SOPN
5.0000 mg | PEN_INJECTOR | SUBCUTANEOUS | 0 refills | Status: AC
Start: 2022-12-03 — End: ?

## 2022-12-03 MED ORDER — MOUNJARO 10 MG/0.5ML SC SOPN
10.0000 mg | PEN_INJECTOR | SUBCUTANEOUS | 0 refills | Status: AC
Start: 2022-12-03 — End: ?

## 2022-12-03 MED ORDER — MOUNJARO 12.5 MG/0.5ML SC SOPN
12.5000 mg | PEN_INJECTOR | SUBCUTANEOUS | 0 refills | Status: AC
Start: 2022-12-03 — End: ?

## 2022-12-03 NOTE — Progress Notes (Signed)
ENDOCRINOLOGY OUTPATIENT VISIT - [facility]    Date and Time of Evaluation:  Wednesday December 03, 2022    Primary Care Physician:  Glenford Peers [facility]     Chief Complaint:   No chief complaint on file.      History of Present Illness:    Keith Cross is a 62 year old male with CVA 2015, HTN, HLD, and T2DM, here for evaluation and management of adrenal nodule.    1. Adrenal nodule.    Subcentimeter adrenal nodule (0.9 cm) on 10/2020 CTAP with 28 HU and absolute/relative contrast washout of 74%/42%, c/w benign adrenal adenoma.     Was seen by Dr. Inetta Fermo of Endocrine Surgery given history of uncontrolled hypertension and previous hormonal w/up with elevated plasma metanephrines 62 (< 57) and normetanephrines 252 (< 148), though less than 2x ULN. Aldosterone level was normal at 8. Repeat labs with Dr. Ronny Bacon with unremarkable plasma metanephrines (normetanephrines very minimally elevated at 0.98, normal < 0.9). 24-hour urine metanephrines were wnl. Aldosterone/renin wnl with normal dexamethasone suppression test.      Currently on multiple hypertensive agents, including clonidine, carvedilol, hydralazine, isosorbide mononitrate, amlodipine, losartan, metoprolol, and furosemide. BP 181/108 in clinic today.     Patient does endorse headaches in the morning and late evening. He does not have a Neurologist. Also endorses episodes of palpitations, diaphoresis, SOB, and chest pain. Advised patient he needs to present to ER if he has these symptoms. Has Cardiology referral. Reports diarrhea with mucus in stool once every few weeks.     Also had thyroid US, read pending.      05/07/2022 follow up:    Visit completed with [name] interpretor ([employer])     Thyroid US with right 1.3 cm TR 5 nodule. Amenable to biopsy.     BP improving now that he has started CPAP for OSA.     Still having intermittent chest pain. Following with cardiology.     Following with neurology for cerebral cavernoma.     07/17/2022 follow up:    Patient  joined by daughter and wife on call.    Discussed that 06/2022 thyroid FNA c/w PTC and that patient will require surgery for treatment, which he is amenable to.     09/03/2022 follow up:    S/p lobectomy 08/01/2022 with Dr. 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 No plan for completion at this time.    More tired. Waking up overnight to use the restroom. Feels cold. Weight is stable. No hair, skin, or nail changes. No constipation or diarrhea. No tremors, palpitations, anxiety. A little trouble swallowing. No hoarseness.      Does endorse ongoing headache and SOB which preceeded surgery. BPs remain labile.    12/03/2022 follow up:    No recent labs - will obtain fasting labs for DM.     Significantly hypertensive in clinic today. After discussion with Dr. Ronalee Red, will resume losartan which patient is amenable to.     Has chest pressure at times; none at this time.    Past Medical/Surgical History:  Past Medical History:   Diagnosis Date    History of CVA (cerebrovascular accident)     Hyperlipidemia     Hypertension     Type 2 diabetes mellitus (CMS-HCC)        Family History:  No family history on file.    Social History:  Social History     Socioeconomic History    Marital status:  Married   Tobacco Use    Smoking status: Never    Smokeless tobacco: Never   Substance and Sexual Activity    Alcohol use: Not Currently       Allergies:  No Known Allergies    Medications:  Current Outpatient Medications on File Prior to Visit   Medication Sig Dispense Refill    acetaminophen (TYLENOL) 325 MG tablet Take 2 tablets (650 mg) by mouth.      albuterol 108 (90 Base) MCG/ACT inhaler TAKE 2 PUFFS EVERY 4 HOURS      allopurinol (ZYLOPRIM) 300 MG tablet Take 1 tablet (300 mg) by mouth daily.      amLODIPINE (NORVASC) 10 MG tablet Take 1 tablet (10 mg) by mouth daily. 90 tablet 3    aspirin 81 MG EC tablet Take 1 tablet (81 mg) by mouth daily. 90 tablet 3    atorvastatin (LIPITOR) 80 MG tablet Take 1 tablet (80 mg) by  mouth daily. 90 tablet 3    Blood Glucose Monitoring Suppl (TRUE METRIX METER) w/Device KIT As Directed.      carvedilol (COREG) 25 MG tablet Take 1 tablet (25 mg) by mouth 2 times daily (with meals). 180 tablet 3    colchicine 0.6 MG tablet TAKE 2 TABLETS NOW AND TAKE 1 TABLET 1 HOUR LATER FOR 1 DAY THEN TWICE A DAY      empagliflozin (JARDIANCE) 25 mg tablet Take 1 tablet (25 mg) by mouth daily. 90 tablet 3    ezetimibe (ZETIA) 10 MG tablet Take 1 tablet (10 mg) by mouth daily. 90 tablet 3    furosemide (LASIX) 40 MG tablet TAKE 1 TABLET BY MOUTH EVERY DAY IN THE MORNING      galcanezumab-gnlm (EMGALITY) 120 mg/mL injection pen Inject 1 mL (120 mg) under the skin every 30 days. 1 mL 11    hydrALAZINE (APRESOLINE) 25 MG tablet Take 1 tablet (25 mg) by mouth as needed (Take if SBP>160, up to twice daily). 90 tablet 3    meclizine (ANTIVERT) 25 MG tablet       metFORMIN (GLUCOPHAGE) 1000 mg tablet Take 1 tablet (1,000 mg) by mouth 2 times daily.      nitroGLYcerin (NITRODUR) 0.1 MG/HR patch       ondansetron (ZOFRAN ODT) 4 MG disintegrating tablet TAKE 1 TABLET BY MOUTH EVERY 6 HOURS AS NEEDED FOR NAUSEA OR VOMITING.      pantoprazole (PROTONIX) 40 MG tablet Take 1 tablet (40 mg) by mouth daily.      pioglitazone (ACTOS) 30 mg tablet Take 1 tablet (30 mg) by mouth.      potassium chloride (KLOR-CON M20) 20 MEQ tablet TAKE 1 TABLET DAILY IN THE MORNING      rimegepant (NURTEC ODT) tablet Dissolve 1 tablet (75 mg) on or under the tongue daily as needed (to treat migraines). Do not take more than one tablet per day. 16 tablet 11    topiramate (TOPAMAX) 50 MG tablet Take 1 tablet (50 mg) by mouth 2 times daily.      TRUE METRIX BLOOD GLUCOSE TEST test strip CHECK BLOOD SUGAR ONCE A DAY      TRUEplus Lancets 30G MISC CHECK BLOOD SUGAR ONCE A DAY      venlafaxine XR (EFFEXOR XR) 75 MG capsule Take 1 capsule (75 mg) by mouth daily. 90 capsule 1     Current Facility-Administered Medications on File Prior to Visit    Medication Dose Route Frequency Provider Last Rate Last Admin  nitroGLYcerin (NITROLINGUAL) spray 1 spray  1 spray Sublingual Q5 Min PRN Paul Half, MD   3 spray at 03/27/21 1439       Physical Exam:  Vital Signs: There were no vitals taken for this visit.    General:  No acute distress  HEENT: No proptosis, lid lag, or periorbital edema. No scleral icterus   Lungs: Symmetric chest rise, normal work of breathing  Neurological: Alerted and oriented x 4  Skin:  No visible lesions  Psych: Appropriate mood and affect    Diagnostic Data:    Laboratory Data:    Lab Results   Component Value Date    HGB 14.4 07/29/2022    HCT 44.9 07/29/2022    PLT 271 07/29/2022    RBC 5.07 07/29/2022    MCV 88.6 07/29/2022    RDW 15.2 (H) 07/29/2022    NEUTP 56.2 07/29/2022    NEUTAB 3.5 07/29/2022    EOSAB 0.3 07/29/2022       Lab Results   Component Value Date/Time    SODIUM 141 07/29/2022 10:55 AM    K 4.2 07/29/2022 10:55 AM    CL 108 (H) 07/29/2022 10:55 AM    CO2 24 07/29/2022 10:55 AM    BUN 13 07/29/2022 10:55 AM    CREAT 1.0 07/29/2022 10:55 AM    GLU 196 (H) 07/29/2022 10:55 AM    [place] 9.3 07/29/2022 10:55 AM    MG 2.0 03/28/2021 08:28 AM    PHOS 3.9 03/28/2021 08:28 AM    TPROT 6.7 07/29/2022 10:55 AM    ALB 4.1 (L) 07/29/2022 10:55 AM    ALK 51 07/29/2022 10:55 AM    TBILI 0.6 07/29/2022 10:55 AM    AST 23 07/29/2022 10:55 AM    ALT 49 07/29/2022 10:55 AM       Lab Results   Component Value Date/Time    A1C 6.5 (H) 07/28/2022 09:12 AM       Lab Results   Component Value Date/Time    TSHHS 0.806 07/29/2022 10:55 AM    FREET4 0.82 07/29/2022 10:55 AM     03/2022 Thyroid US    IMPRESSION:     1.  Indeterminate 1.3 cm TR 5 nodule in the right thyroid lobe. Recommend ultrasound-guided fine-needle aspiration for further evaluation.    06/18/2022 Thyroid FNA        07/2022 Right Lobectomy    A.         RIGHT THYROID LOBE, LOBECTOMY:  -           Papillary thyroid carcinoma of the right lobe (1 cm), limited to the thyroid.  -            No obvious lymphovascular invasion seen (see CAP Cancer Protocol Summary below).  -           Surrounding multinodular hyperplastic parenchyma.      Electronically signed by Baron Hamper, MD for Irving Copas, MD on 08/08/2022 at 1256   Comment    Prior fine needle aspiration findings ([id number]) noted.  Multiple deeper levels were examined.      Synoptic Checklist   THYROID GLAND   8th Edition - Protocol posted: 3/22/2023THYROID GLAND: RESECTION - All Specimens  SPECIMEN   Procedure  Right lobectomy   TUMOR   Tumor Focality  Unifocal   Tumor Characteristics     Tumor Site  Right lobe   Tumor Size  Greatest Dimension (Centimeters): 1 cm   Additional Dimension (  Centimeters)  0.5 cm     0.3 cm   Histologic Tumor Types and Subtypes  Papillary carcinoma, classic subtype   Tumor Proliferative Activity     Ki-67 Labeling Index  >3 %   Methodology  Manual count   Tumor Necrosis  Not identified   Angioinvasion (vascular invasion)  Not identified   Lymphatic Invasion  Not identified   Perineural Invasion  Not identified   Extrathyroidal Extension  Not identified   Margin Status  All margins negative for carcinoma   Distance from Invasive Carcinoma to Closest Margin  At least: 1 mm   Margin Comment  Right posterior (Block: A1)   REGIONAL LYMPH NODES   Regional Lymph Node Status  Not applicable (no regional lymph nodes submitted or found)   pTNM CLASSIFICATION (AJCC 8th Edition)   Reporting of pT, pN, and (when applicable) pM categories is based on information available to the pathologist at the time the report is issued. As per the AJCC (Chapter 1, 8th Ed.) it is the managing physician's responsibility to establish the final pathologic stage based upon all pertinent information, including but potentially not limited to this pathology report.        pT Category  pT1a   pN Category  pN not assigned (no nodes submitted or found)   Comment(s)  BRAF positive   .      Immunohistochemistry Interpretation    A2        BRAF                 Positive              ERG                  Positive in endothelial cells              Ki-67                Slightly elevated: 3~5% of tumor cells         Impression:  Keith Cross is a 62 year old male with CVA 2015, HTN, HLD, and T2DM, here for evaluation and management of adrenal nodule.    # Papillary thyroid cancer  - 1.3 cm right TR 5 nodule s/p FNA c/w PTC in 06/2022  - S/p right lobectomy with Dr. Ronny Bacon with 1 cm unifocal classic PTC without concerning features  - No plan for completion at this time  - Normal TFTS 07/2022 - will repeat with next labs    # T2DM   - A1c 6.5% in 07/2022, goal < 8% without hypoglycemia given hx of CAD  - MTF 1g BID  - Jardiance 25 mg daily   - Actos 30 mg  - Start Mounjaro given cardiovascular history - will plan to wean off Actos as tolerated    # Adrenal nodule  - 0.9 cm with negative hormonal workup (normal aldosterone/renin, DST, and minimally elevated plasma normetanephrines <2x ULN) in 12/2020 with Dr. Inetta Fermo  - Repeat aldo/renin unremarkable, with minimally elevated plasma normetanephrines again in 01/2021; has not completed LNSC yet   - Will repeat CT scan and hormonal testing; if stable, no further monitoring given size < 1 cm     #HTN  - Refractory despite multiple antihypertensives  - Resume losartan 25 mg daily today; follow up with Dr. Ronalee Red on 2/13 as scheduled  - Negative workup for hyperaldosteronism and pheochromocytoma.   - On CPAP    Future Appointments  Date Time Provider [place]   12/23/2022  2:30 PM Paul Half, MD [place] [id number] [place]   01/01/2023  9:00 AM Simonne Come, MD [facility]   02/13/2023  8:45 AM Murriel Hopper, MD [place] [facility] [place]   04/01/2023  3:00 PM Chase Knebel, Betsey Holiday, MD [facility] [place] [facility]     Vernell Leep, MD

## 2022-12-03 NOTE — Patient Instructions (Signed)
Start losartan 25 mg daily for blood pressure  Start Mounjaro 2.5 mg weekly injections for diabetes; increase each month (already ordered). If you do not feel well, please call us to give you the lower dose for another month  Please pick up material for salivary test (3 sets) and 24-hour urine test. For the salivary test, please do not eat or drink anything but water for 2 hours prior, then rinse out mouth with water 10 minutes prior, and wait to brush/floss until AFTER (to prevent small amounts of blood contaminating). Please turn these all in and get blood drawn at 8 am  Please call radiology at [phone] to schedule your CT abdomen scan  Return to clinic in 3-4 months

## 2022-12-04 LAB — GLYCOSYLATED HGB(A1C), BLOOD: Glycated Hgb, A1C: 7.7 % — ABNORMAL HIGH (ref 4.6–5.6)

## 2022-12-05 LAB — ALDOSTERONE, BLOOD: Aldosterone: 13.2 ng/dL

## 2022-12-07 LAB — RENIN, BLOOD: Renin: 1.5 ng/mL/hr

## 2022-12-09 LAB — METANEPHRINES, PLASMA
Metanephrine,Free: 0.28 nmol/L (ref <0.50)
Normetanephrine,Free: 0.49 nmol/L (ref <0.90)

## 2022-12-22 NOTE — Progress Notes (Unsigned)
[facility] Note    Patient: Keith Cross  Patient DOB: Nov 03, 1961  Patient ID: [id number]  Patient PCP: Emmaline Kluver  Date of Service: 12/23/2022    Reason for Visit  No chief complaint on file.       HPI  Keith Cross is a 62 year old male    Interval History  Patients last seen 08/19/22    In the interim, added losartan 25 mg daily by endocrinology    S/p thyroidectomy 08/01/22    Blood pressures improved at home.    Still having some chest discomfort with exertion      Problem List  Patient Active Problem List    Diagnosis Date Noted    Papillary thyroid carcinoma (CMS-HCC) 07/25/2022    Cerebral cavernoma 07/04/2021    OSA (obstructive sleep apnea) 04/17/2021    Chest pain 03/27/2021    Abnormal stress echo 03/27/2021        PMH  Past Medical History:   Diagnosis Date    History of CVA (cerebrovascular accident)     Hyperlipidemia     Hypertension     Type 2 diabetes mellitus (CMS-HCC)        Meds  Current Outpatient Medications   Medication Sig    acetaminophen (TYLENOL) 325 MG tablet Take 2 tablets (650 mg) by mouth.    albuterol 108 (90 Base) MCG/ACT inhaler TAKE 2 PUFFS EVERY 4 HOURS    allopurinol (ZYLOPRIM) 300 MG tablet Take 1 tablet (300 mg) by mouth daily.    amLODIPINE (NORVASC) 10 MG tablet Take 1 tablet (10 mg) by mouth daily.    aspirin 81 MG EC tablet Take 1 tablet (81 mg) by mouth daily.    atorvastatin (LIPITOR) 80 MG tablet Take 1 tablet (80 mg) by mouth daily.    Blood Glucose Monitoring Suppl (TRUE METRIX METER) w/Device KIT As Directed.    carvedilol (COREG) 25 MG tablet Take 1 tablet (25 mg) by mouth 2 times daily (with meals).    colchicine 0.6 MG tablet TAKE 2 TABLETS NOW AND TAKE 1 TABLET 1 HOUR LATER FOR 1 DAY THEN TWICE A DAY    empagliflozin (JARDIANCE) 25 mg tablet Take 1 tablet (25 mg) by mouth daily.    ezetimibe (ZETIA) 10 MG tablet Take 1 tablet (10 mg) by mouth daily.    furosemide (LASIX) 40 MG tablet TAKE 1 TABLET BY MOUTH EVERY DAY IN THE MORNING     galcanezumab-gnlm (EMGALITY) 120 mg/mL injection pen Inject 1 mL (120 mg) under the skin every 30 days.    hydrALAZINE (APRESOLINE) 25 MG tablet Take 1 tablet (25 mg) by mouth as needed (Take if SBP>160, up to twice daily).    losartan (COZAAR) 25 MG tablet Take 1 tablet (25 mg) by mouth daily.    meclizine (ANTIVERT) 25 MG tablet     metFORMIN (GLUCOPHAGE) 1000 mg tablet Take 1 tablet (1,000 mg) by mouth 2 times daily.    nitroGLYcerin (NITRODUR) 0.1 MG/HR patch     ondansetron (ZOFRAN ODT) 4 MG disintegrating tablet TAKE 1 TABLET BY MOUTH EVERY 6 HOURS AS NEEDED FOR NAUSEA OR VOMITING.    pantoprazole (PROTONIX) 40 MG tablet Take 1 tablet (40 mg) by mouth daily.    pioglitazone (ACTOS) 30 mg tablet Take 1 tablet (30 mg) by mouth.    potassium chloride (KLOR-CON M20) 20 MEQ tablet TAKE 1 TABLET DAILY IN THE MORNING    rimegepant (NURTEC ODT) tablet Dissolve 1 tablet (75  mg) on or under the tongue daily as needed (to treat migraines). Do not take more than one tablet per day.    Tirzepatide [facility]) 10 MG/0.5ML injection pen Inject 0.5 mL (10 mg) under the skin every 7 days. Inject 10 mg once a week for 4 weeks, then increase to 12.5 mg weekly    Tirzepatide (MOUNJARO) 12.5 MG/0.5ML injection pen Inject 0.5 mL (12.5 mg) under the skin every 7 days. Inject 12.5 mg once a week for 4 weeks, then increase to 15 mg weekly    Tirzepatide (MOUNJARO) 15 MG/0.5ML injection pen Inject 0.5 mL (15 mg) under the skin every 7 days. After 4 weeks of the 12.5 mg/week dose, increase to 15 mg weekly. This is the maintenance dose.    Tirzepatide [facility]) 2.5 MG/0.5ML injection pen Inject 0.5 mL (2.5 mg) under the skin every 7 days. for 28 days. Then start the 5 mg into the skin every 7 days.    Tirzepatide [facility]) 5 MG/0.5ML injection pen Inject 0.5 mL (5 mg) under the skin every 7 days. Inject 5 mg every week for 4 weeks, then increase to 7.5 mg weekly    Tirzepatide (MOUNJARO) 7.5 MG/0.5ML injection pen Inject 0.5 mL (7.5 mg)  under the skin every 7 days. Inject 7.5 mg once a week for 4 weeks, then increase to 10 mg weekly    topiramate (TOPAMAX) 50 MG tablet Take 1 tablet (50 mg) by mouth 2 times daily.    TRUE METRIX BLOOD GLUCOSE TEST test strip CHECK BLOOD SUGAR ONCE A DAY    TRUEplus Lancets 30G MISC CHECK BLOOD SUGAR ONCE A DAY    venlafaxine XR (EFFEXOR XR) 75 MG capsule Take 1 capsule (75 mg) by mouth daily.     Current Facility-Administered Medications   Medication    nitroGLYcerin (NITROLINGUAL) spray 1 spray       All  No Known Allergies    ROS  A comprehensive review of 12 organ systems was performed and reviewed with the patient. Review of systems with the patient was negative except as indicated in the HPI above.    SH  Social History     Socioeconomic History    Marital status: Married     Spouse name: Not on file    Number of children: Not on file    Years of education: Not on file    Highest education level: Not on file   Occupational History    Not on file   Tobacco Use    Smoking status: Never    Smokeless tobacco: Never   Substance and Sexual Activity    Alcohol use: Not Currently    Drug use: Not on file    Sexual activity: Not on file   Other Topics Concern    Not on file   Social History Narrative    Not on file     Social Determinants of Health     Financial Resource Strain: Not on file   Food Insecurity: Not on file   Transportation Needs: Not on file   Physical Activity: Not on file   Stress: Not on file   Social Connections: Not on file   Intimate Partner Violence: Not on file   Housing Stability: Not on file     FH  No family history on file.    Past Surgical History:  No date: Left knee surgery     Physical Exam  There were no vitals filed for this visit.  Gen: pleasant, in NAD  Neuro: A&Ox3, no focal deficit  HEENT:  NC/AT. MMM  Neck: no bruit. JVD   CV: regular rate, normal rhythm, S1 S2   Resp: clear, no rales  Abd:  NABS soft ntnd  Ext: no c/c/e  Vasc: 2+ radial, DP pulses. Cap refill < 3 sec. Periphery  warm    Labs  Lab Results   Component Value Date    RBC 5.07 07/29/2022    HGB 14.4 07/29/2022    HCT 44.9 07/29/2022    MCV 88.6 07/29/2022    MCHC 32.0 07/29/2022    RDW 15.2 (H) 07/29/2022    PLT 271 07/29/2022     Lab Results   Component Value Date    BUN 18 12/03/2022    CREAT 1.1 12/03/2022    CL 106 12/03/2022    K 3.8 12/03/2022    [place] 9.0 12/03/2022    TBILI 0.6 12/03/2022    ALB 4.4 12/03/2022    AST 20 12/03/2022    ALK 54 12/03/2022    ALT 31 12/03/2022    GLU 142 (H) 12/03/2022     Lab Results   Component Value Date    INR 0.94 07/29/2022    PTT 28.5 07/29/2022       Lab Results   Component Value Date    BNP 21 03/27/2021     Lab Results   Component Value Date    A1C 7.7 (H) 12/03/2022     Lab Results   Component Value Date    CHOL 176 12/03/2022    LDLCALC 98 12/03/2022    TRIG 149 12/03/2022       Other Diagnostics  Coronary angiogram:  [facility] on 03/27/2021:  CONCLUSIONS:   1. Mid LAD with 50% stenosis. RFR of mid LAD was 0.95. This was physiologically non-significant.   2. LVEDP 20-25 mm Hg.     RECOMMENDATIONS:   1. Maximize medical therapy.   2. Aggressive risk factor modification efforts.   3. Monitor vitals and arterial access site/pulses.    Echocardiogram 03/27/21:   1. Mildly decreased left ventricular systolic function. The left ventricular ejection fraction is 51% by Biplane.   2. Left ventricular ejection fraction is 53% by 3D.   3. Mild concentric left ventricular hypertrophy.   4. Normal LV filling pressures.   5. Normal right ventricular size and systolic function.    Exercise stress echocardiogram 03/27/21:   1. Positive exercise stress test for ischemia.   2. Patient developed chest pain at peak excercise that improved with recovery and rest.   3. Stage 1: 1: Mid and apical inferior wall is abnormal.      Stage 2: 2: Apical inferior segment is abnormal.   4. The peak estimated pulmonary artery systolic pressure during exercise was indeterminate.   5. Average functional capacity.    Assessment  and plan:   Keith Cross is a 62 year old male    #Hypertension  -Suspect significant improvement due to initiating CPAP for sleep apnea  -Continue carvedilol 25 mg BID, nitroglycerin patch 0.1 mg/hr for now.  Add hydralazine 25 mg PRN for SBP>160  -Re-add amlodipine 10 mg daily for both BP and anti-anginal effect  -Prior secondary hypertension work-up negative for hyperaldosteronism and pheochromocytoma.  Normal creatinine.  His left arm blood pressure was actually higher than his right arm blood pressure which does not suggest coarctation of the aorta.    -Bilateral renal artery stenosis unlikely with normal creatinine on  ARB, no hemodynamically significant renal artery stenosis on duplex US  -Following with sleep medicine for treatment of severe OSA    #Coronary artery disease  -Moderate non obstructive disease found on coronary angiogram  -Aspirin 81 mg daily, carvedilol 25 mg BID, atorvastatin 80 mg daily, amlodipine 10 mg daily, nitroglycerin 0.1 mg/hr patch  -He had abnormal exercise stress echocardiogram and was referred for coronary angiogram as above which showed non obstructive disease  -Consider microvascular disease?  Control blood pressure as above.  Nitroglycerin 0.1 mg/hr patch.    -Will refer to cardiac rehab to improve exertional capacity    #Hyperlipidemia  -Goal LDL<70 mg/dL given CAD  -FLP 04/17/21: tc 167, hdl 47, ldL 93, TG 137  -FLP 06/19/21: TC 97, HDL 44, LDL 33, TG 98  -FLP 05/15/22: TC 170, HDL 60, LDL 92, TG 91  -FLP 07/28/22: TC 97, HDL 41, LDL 37, TG 46  -Atorvastatin 80 mg daily, ezetimibe 10 mg daily  -Now at goal, continue    #Valve issue?  -Unclear issue with the valve.  TTE shows no significant valvular heart disease.  He has TTE in our system from 2016 showing aortic sclerosis with no other signficant valve disease    RTC in four months    I spent greater than 30 minutes on the day of the encounter reviewing the patient's diagnostic tests, performing a history and physical exam, writing  orders, educating the patient, coordinating patient care with the staff, and documenting in the electronic medical records system.             ICD-10-CM ICD-9-CM    1. Benign hypertensive heart disease without heart failure  I11.9 402.10       2. Hyperlipidemia, unspecified hyperlipidemia type  E78.5 272.4       3. Chest pain, unspecified type  R07.9 786.50       4. Coronary artery disease involving native coronary artery of native heart without angina pectoris  I25.10 414.01       5. Aortic valve disorder  I35.9 424.1            There are no Patient Instructions on file for this visit.    Paul Half, MD  Health Sciences Assistant Clinical Professor  Division of Cardiology  Department of [place]

## 2022-12-23 ENCOUNTER — Ambulatory Visit: Payer: BC Managed Care – PPO | Attending: Cardiovascular Disease | Admitting: Cardiovascular Disease

## 2022-12-23 VITALS — BP 158/97 | HR 92 | Resp 16 | Ht 66.0 in | Wt 177.0 lb

## 2022-12-23 DIAGNOSIS — E785 Hyperlipidemia, unspecified: Secondary | ICD-10-CM

## 2022-12-23 DIAGNOSIS — Z6828 Body mass index (BMI) 28.0-28.9, adult: Secondary | ICD-10-CM

## 2022-12-23 DIAGNOSIS — I359 Nonrheumatic aortic valve disorder, unspecified: Secondary | ICD-10-CM

## 2022-12-23 DIAGNOSIS — R079 Chest pain, unspecified: Secondary | ICD-10-CM

## 2022-12-23 DIAGNOSIS — I2510 Atherosclerotic heart disease of native coronary artery without angina pectoris: Secondary | ICD-10-CM

## 2022-12-23 DIAGNOSIS — I119 Hypertensive heart disease without heart failure: Secondary | ICD-10-CM

## 2022-12-23 MED ORDER — LOSARTAN POTASSIUM 50 MG OR TABS
50.0000 mg | ORAL_TABLET | Freq: Every day | ORAL | 3 refills | Status: DC
Start: 2022-12-23 — End: 2023-04-23

## 2022-12-23 NOTE — Patient Instructions (Addendum)
Dear Lance Bosch,                                Your cardiologist has referred you to participate in Cardiac Rehabilitation. The program consists of progressive exercise 3 times a week for approximately 1 hour for a period of 4 to 12 weeks. Cardiac Rehab classes are every Monday, Wednesday and Friday.                                                                                If you are interested in scheduling for your Cardiac Rehabilitation consult, please call [phone] between 8:00 am and 4:00 pm Monday through Friday.                                                                    Our highly trained staff will work with you to schedule an appointment that is most convenient for you.  They will assist with whatever your needs may be (insurance, directions, or even translation services).  On the day of your appointment, please remember to bring copies of any necessary medical records, as well as a list of your current medications.          My Blood Pressure Log       Take at least two blood pressure readings one minute apart - once in the morning before taking medications and in the evening before dinner.    For best results, remain still for at least 5 minutes. Sit with your back straight and supported and your feet at on the floor.    When you measure your blood pressure, rest your arm on a at surface (such as a table) so your upper arm is at heart level.    Record your blood pressure on this sheet and show it to your doctor at every visit.    Date AM PM  DATE AM  PM

## 2022-12-28 ENCOUNTER — Ambulatory Visit: Payer: BC Managed Care – PPO

## 2023-01-01 ENCOUNTER — Inpatient Hospital Stay: Admit: 2023-01-01 | Payer: Self-pay

## 2023-01-01 ENCOUNTER — Encounter: Payer: Self-pay | Admitting: Neurosurgery

## 2023-01-01 ENCOUNTER — Ambulatory Visit: Payer: BC Managed Care – PPO | Attending: Neurosurgery | Admitting: Neurosurgery

## 2023-01-01 VITALS — BP 177/118 | HR 84 | Temp 97.7°F | Resp 17 | Ht 66.0 in | Wt 177.4 lb

## 2023-01-01 DIAGNOSIS — E278 Other specified disorders of adrenal gland: Secondary | ICD-10-CM | POA: Insufficient documentation

## 2023-01-01 DIAGNOSIS — Q283 Other malformations of cerebral vessels: Secondary | ICD-10-CM | POA: Insufficient documentation

## 2023-01-01 DIAGNOSIS — Z6828 Body mass index (BMI) 28.0-28.9, adult: Secondary | ICD-10-CM | POA: Insufficient documentation

## 2023-01-01 MED ORDER — LOSARTAN POTASSIUM 25 MG OR TABS
ORAL_TABLET | ORAL | Status: DC
Start: 2022-12-23 — End: 2023-04-23

## 2023-01-01 MED ORDER — ISOSORBIDE MONONITRATE CR 60 MG OR TB24
ORAL_TABLET | ORAL | Status: AC
Start: 2022-10-18 — End: ?

## 2023-01-01 NOTE — Patient Instructions (Signed)
Scan(s) Ordered:  We will request for authorization from your insurance.  If this is approved outside [place], please obtain the CD images and bring back to our office so we can upload the images into your chart.  Please have them fax the report to [phone].     Otherwise, if you complete this study at [place], you do not need to do anything.      [facility] [place]:  [phone]      To follow up on your authorization status, you can call [phone]

## 2023-01-01 NOTE — Progress Notes (Signed)
[name] telephone interpreter was used for visit.     Simonne Come, MD  [facility]  Department of Neurological Surgery  [place]  [place], [place] [address]   [email or web address]  New Referrals: [phone] Fax: [phone]  Follow Up Apt: [id number]    Follow-Up Visit                                    Date of Evaluation: 01/01/2023    Dear Dr. Pasty Arch, Referred,    I had the pleasure of seeing your patient Keith Cross in neurosurgery clinic today.  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   He did present in July to [facility] with right sided HA and left sided hemiplegia.  His symptoms improved and he was discharged with complicated migraine. He spent 2 weeks in rehab before his motor function improved and it is still not at 100 %.     I did review his CT from Dec and MRI from July which show that the left temporal and right posterior medullary lesions are unchanged.      I am a bit worried about these lesions especially with the right sided weakness.  But I do not see any evidence of ICH or growth.    Because the risk of surgery particularly for the medullary lesion, I would not recommend any intervention  unless the patient has a symptomatic hemorrhage or uncontrollable epilepsy.      I would like to see the patient back with a followup MRI in 5 months.      Current Medications:  Current Outpatient Medications   Medication Sig Dispense Refill    acetaminophen (TYLENOL) 325 MG tablet Take 2 tablets (650 mg) by mouth.      albuterol 108 (90 Base) MCG/ACT inhaler TAKE 2 PUFFS EVERY 4 HOURS      allopurinol (ZYLOPRIM) 300 MG tablet Take 1 tablet (300 mg) by mouth daily.      amLODIPINE (NORVASC) 10 MG tablet Take 1 tablet (10 mg) by mouth daily. 90 tablet 3    aspirin 81 MG EC tablet Take 1 tablet (81  mg) by mouth daily. 90 tablet 3    atorvastatin (LIPITOR) 80 MG tablet Take 1 tablet (80 mg) by mouth daily. 90 tablet 3    Blood Glucose Monitoring Suppl (TRUE METRIX METER) w/Device KIT As Directed.      carvedilol (COREG) 25 MG tablet Take 1 tablet (25 mg) by mouth 2 times daily (with meals). 180 tablet 3    colchicine 0.6 MG tablet TAKE 2 TABLETS NOW AND TAKE 1 TABLET 1 HOUR LATER FOR 1 DAY THEN TWICE A DAY      empagliflozin (JARDIANCE) 25 mg tablet Take 1 tablet (25 mg) by mouth daily. 90 tablet 3    ezetimibe (ZETIA) 10 MG tablet Take 1 tablet (10 mg) by mouth daily. 90 tablet 3    furosemide (LASIX) 40 MG tablet TAKE 1 TABLET BY MOUTH EVERY DAY IN THE MORNING      galcanezumab-gnlm (EMGALITY) 120 mg/mL injection pen Inject 1 mL (120 mg) under the skin every 30 days. 1 mL 11    hydrALAZINE (APRESOLINE) 25 MG tablet Take 1 tablet (25  mg) by mouth as needed (Take if SBP>160, up to twice daily). 90 tablet 3    isosorbide mononitrate (IMDUR) 60 MG Controlled-Release tablet       losartan (COZAAR) 25 MG tablet       losartan (COZAAR) 50 MG tablet Take 1 tablet (50 mg) by mouth daily. 90 tablet 3    meclizine (ANTIVERT) 25 MG tablet       metFORMIN (GLUCOPHAGE) 1000 mg tablet Take 1 tablet (1,000 mg) by mouth 2 times daily.      nitroGLYcerin (NITRODUR) 0.1 MG/HR patch       ondansetron (ZOFRAN ODT) 4 MG disintegrating tablet TAKE 1 TABLET BY MOUTH EVERY 6 HOURS AS NEEDED FOR NAUSEA OR VOMITING.      pantoprazole (PROTONIX) 40 MG tablet Take 1 tablet (40 mg) by mouth daily.      pioglitazone (ACTOS) 30 mg tablet Take 1 tablet (30 mg) by mouth.      potassium chloride (KLOR-CON M20) 20 MEQ tablet TAKE 1 TABLET DAILY IN THE MORNING      rimegepant (NURTEC ODT) tablet Dissolve 1 tablet (75 mg) on or under the tongue daily as needed (to treat migraines). Do not take more than one tablet per day. 16 tablet 11    Tirzepatide (MOUNJARO) 10 MG/0.5ML injection pen Inject 0.5 mL (10 mg) under the skin every 7 days. Inject 10  mg once a week for 4 weeks, then increase to 12.5 mg weekly 2 mL 0    Tirzepatide (MOUNJARO) 12.5 MG/0.5ML injection pen Inject 0.5 mL (12.5 mg) under the skin every 7 days. Inject 12.5 mg once a week for 4 weeks, then increase to 15 mg weekly 2 mL 0    Tirzepatide (MOUNJARO) 15 MG/0.5ML injection pen Inject 0.5 mL (15 mg) under the skin every 7 days. After 4 weeks of the 12.5 mg/week dose, increase to 15 mg weekly. This is the maintenance dose. 2 mL 11    Tirzepatide (MOUNJARO) 2.5 MG/0.5ML injection pen Inject 0.5 mL (2.5 mg) under the skin every 7 days. for 28 days. Then start the 5 mg into the skin every 7 days. 2 mL 0    Tirzepatide (MOUNJARO) 5 MG/0.5ML injection pen Inject 0.5 mL (5 mg) under the skin every 7 days. Inject 5 mg every week for 4 weeks, then increase to 7.5 mg weekly 2 mL 0    Tirzepatide (MOUNJARO) 7.5 MG/0.5ML injection pen Inject 0.5 mL (7.5 mg) under the skin every 7 days. Inject 7.5 mg once a week for 4 weeks, then increase to 10 mg weekly 2 mL 0    topiramate (TOPAMAX) 50 MG tablet Take 1 tablet (50 mg) by mouth 2 times daily.      TRUE METRIX BLOOD GLUCOSE TEST test strip CHECK BLOOD SUGAR ONCE A DAY      TRUEplus Lancets 30G MISC CHECK BLOOD SUGAR ONCE A DAY      venlafaxine XR (EFFEXOR XR) 75 MG capsule Take 1 capsule (75 mg) by mouth daily. 90 capsule 1     Current Facility-Administered Medications   Medication Dose Route Frequency Provider Last Rate Last Admin    nitroGLYcerin (NITROLINGUAL) spray 1 spray  1 spray Sublingual Q5 Min PRN Paul Half, MD   3 spray at 03/27/21 1439             I spent 20 min/50% of visit in consultation regarding the patient's symptoms, imaging and management.    Simonne Come, MD  Department of Neurological Surgery

## 2023-01-02 ENCOUNTER — Other Ambulatory Visit
Admission: RE | Admit: 2023-01-02 | Discharge: 2023-01-02 | Disposition: A | Discharge status: Home Health | Payer: BC Managed Care – PPO | Attending: Student in an Organized Health Care Education/Training Program

## 2023-01-02 DIAGNOSIS — E278 Other specified disorders of adrenal gland: Secondary | ICD-10-CM | POA: Insufficient documentation

## 2023-01-02 LAB — 24 HOUR URINE CREATININE
Creat UR Period: 24 HOURS
Creat UR Volume: 2080 ML
Creat UR mg/dL: 72.5 MG/DL
Creat UR: 1.5 G/TOT VOL (ref 1.5–2.0)

## 2023-01-02 LAB — CORTISOL, URINE FREE

## 2023-01-07 ENCOUNTER — Ambulatory Visit
Admission: RE | Admit: 2023-01-07 | Discharge: 2023-01-07 | Disposition: A | Discharge status: Home Health | Payer: BC Managed Care – PPO | Attending: Student in an Organized Health Care Education/Training Program | Admitting: Student in an Organized Health Care Education/Training Program

## 2023-01-07 DIAGNOSIS — E279 Disorder of adrenal gland, unspecified: Secondary | ICD-10-CM

## 2023-01-07 DIAGNOSIS — K760 Fatty (change of) liver, not elsewhere classified: Secondary | ICD-10-CM

## 2023-01-07 DIAGNOSIS — N200 Calculus of kidney: Secondary | ICD-10-CM | POA: Insufficient documentation

## 2023-01-07 DIAGNOSIS — E278 Other specified disorders of adrenal gland: Secondary | ICD-10-CM | POA: Insufficient documentation

## 2023-01-07 LAB — CORTISOL, SALIVA
Midnight Cortisol, Salivary: <50 ng/dL (ref <100)
Midnight Cortisol, Salivary: <50 ng/dL (ref <100)
Midnight Cortisol, Salivary: <50 ng/dL (ref <100)

## 2023-01-07 LAB — CORTISOL, URINE FREE
Cortisol UR Free Time, Hr: 24 h
Cortisol UR Free Tot Vol, mL: 2080 mL
Cortisol Urine Free Creatinine mg/dL: 75 mg/dL
Cortisol Urine Free ug/Day: 14.1 ug/d (ref <=60.0)
Cortisol Urine Free ug/L: 6.76 ug/L

## 2023-01-22 NOTE — Progress Notes (Signed)
[facility] Note    Patient: Keith Cross  Patient DOB: Jan 28, 1961  Patient ID: [id number]  Patient PCP: Emmaline Kluver  Date of Service: 01/26/2023    Reason for Visit  Follow Up       HPI  Keith Cross is a 62 year old male    Interval History  Patients last seen 12/23/22    Has been recording his blood pressures.  In general, higher in the morning with average of [id number] systolic with some nadirs at night to as low as [id number] systolic.  Takes all of his blood pressure medications in the morning    Still having chest discomfort, tried to contact cardiac rehab but reports was not able to get in contact with them    Problem List  Patient Active Problem List    Diagnosis Date Noted    Papillary thyroid carcinoma (CMS-HCC) 07/25/2022    Cerebral cavernoma 07/04/2021    OSA (obstructive sleep apnea) 04/17/2021    Chest pain 03/27/2021    Abnormal stress echo 03/27/2021        PMH  Past Medical History:   Diagnosis Date    History of CVA (cerebrovascular accident)     Hyperlipidemia     Hypertension     Type 2 diabetes mellitus (CMS-HCC)        Meds  Current Outpatient Medications   Medication Sig    acetaminophen (TYLENOL) 325 MG tablet Take 2 tablets (650 mg) by mouth.    albuterol 108 (90 Base) MCG/ACT inhaler TAKE 2 PUFFS EVERY 4 HOURS    allopurinol (ZYLOPRIM) 300 MG tablet Take 1 tablet (300 mg) by mouth daily.    amLODIPINE (NORVASC) 10 MG tablet Take 1 tablet (10 mg) by mouth daily.    aspirin 81 MG EC tablet Take 1 tablet (81 mg) by mouth daily.    atorvastatin (LIPITOR) 80 MG tablet Take 1 tablet (80 mg) by mouth daily.    Blood Glucose Monitoring Suppl (TRUE METRIX METER) w/Device KIT As Directed.    carvedilol (COREG) 25 MG tablet Take 1 tablet (25 mg) by mouth 2 times daily (with meals).    colchicine 0.6 MG tablet TAKE 2 TABLETS NOW AND TAKE 1 TABLET 1 HOUR LATER FOR 1 DAY THEN TWICE A DAY    empagliflozin (JARDIANCE) 25 mg tablet Take 1 tablet (25 mg) by mouth daily.    ezetimibe (ZETIA) 10 MG  tablet Take 1 tablet (10 mg) by mouth daily.    furosemide (LASIX) 40 MG tablet TAKE 1 TABLET BY MOUTH EVERY DAY IN THE MORNING    galcanezumab-gnlm (EMGALITY) 120 mg/mL injection pen Inject 1 mL (120 mg) under the skin every 30 days.    hydrALAZINE (APRESOLINE) 25 MG tablet Take 1 tablet (25 mg) by mouth as needed (Take if SBP>160, up to twice daily).    isosorbide mononitrate (IMDUR) 60 MG Controlled-Release tablet     losartan (COZAAR) 25 MG tablet     losartan (COZAAR) 50 MG tablet Take 1 tablet (50 mg) by mouth daily.    meclizine (ANTIVERT) 25 MG tablet     metFORMIN (GLUCOPHAGE) 1000 mg tablet Take 1 tablet (1,000 mg) by mouth 2 times daily.    nitroGLYcerin (NITRODUR) 0.1 MG/HR patch     ondansetron (ZOFRAN ODT) 4 MG disintegrating tablet TAKE 1 TABLET BY MOUTH EVERY 6 HOURS AS NEEDED FOR NAUSEA OR VOMITING.    pantoprazole (PROTONIX) 40 MG tablet Take 1 tablet (40 mg) by mouth  daily.    pioglitazone (ACTOS) 30 mg tablet Take 1 tablet (30 mg) by mouth.    potassium chloride (KLOR-CON M20) 20 MEQ tablet TAKE 1 TABLET DAILY IN THE MORNING    rimegepant (NURTEC ODT) tablet Dissolve 1 tablet (75 mg) on or under the tongue daily as needed (to treat migraines). Do not take more than one tablet per day.    Tirzepatide [facility]) 10 MG/0.5ML injection pen Inject 0.5 mL (10 mg) under the skin every 7 days. Inject 10 mg once a week for 4 weeks, then increase to 12.5 mg weekly    Tirzepatide (MOUNJARO) 12.5 MG/0.5ML injection pen Inject 0.5 mL (12.5 mg) under the skin every 7 days. Inject 12.5 mg once a week for 4 weeks, then increase to 15 mg weekly    Tirzepatide (MOUNJARO) 15 MG/0.5ML injection pen Inject 0.5 mL (15 mg) under the skin every 7 days. After 4 weeks of the 12.5 mg/week dose, increase to 15 mg weekly. This is the maintenance dose.    Tirzepatide [facility]) 2.5 MG/0.5ML injection pen Inject 0.5 mL (2.5 mg) under the skin every 7 days. for 28 days. Then start the 5 mg into the skin every 7 days.     Tirzepatide [facility]) 5 MG/0.5ML injection pen Inject 0.5 mL (5 mg) under the skin every 7 days. Inject 5 mg every week for 4 weeks, then increase to 7.5 mg weekly    Tirzepatide (MOUNJARO) 7.5 MG/0.5ML injection pen Inject 0.5 mL (7.5 mg) under the skin every 7 days. Inject 7.5 mg once a week for 4 weeks, then increase to 10 mg weekly    topiramate (TOPAMAX) 50 MG tablet Take 1 tablet (50 mg) by mouth 2 times daily.    TRUE METRIX BLOOD GLUCOSE TEST test strip CHECK BLOOD SUGAR ONCE A DAY    TRUEplus Lancets 30G MISC CHECK BLOOD SUGAR ONCE A DAY    venlafaxine XR (EFFEXOR XR) 75 MG capsule Take 1 capsule (75 mg) by mouth daily.     Current Facility-Administered Medications   Medication    nitroGLYcerin (NITROLINGUAL) spray 1 spray       All  No Known Allergies    ROS  A comprehensive review of 12 organ systems was performed and reviewed with the patient. Review of systems with the patient was negative except as indicated in the HPI above.    SH  Social History     Socioeconomic History    Marital status: Married     Spouse name: Not on file    Number of children: Not on file    Years of education: Not on file    Highest education level: Not on file   Occupational History    Not on file   Tobacco Use    Smoking status: Never    Smokeless tobacco: Never   Substance and Sexual Activity    Alcohol use: Not Currently    Drug use: Not on file    Sexual activity: Not on file   Other Topics Concern    Not on file   Social History Narrative    Not on file     Social Determinants of Health     Financial Resource Strain: Not on file   Food Insecurity: Not on file   Transportation Needs: Not on file   Physical Activity: Not on file   Stress: Not on file   Social Connections: Not on file   Intimate Partner Violence: Not on file   Housing  Stability: Not on file     FH  No family history on file.    Past Surgical History:  No date: Left knee surgery     Physical Exam   01/26/23  1314 01/26/23  1315   BP: (!) 156/101 (!) 163/97    Pulse: 90 90   Gen: pleasant, in NAD  Neuro: A&Ox3, no focal deficit  HEENT:  NC/AT. MMM  Neck: no bruit. JVD   CV: regular rate, normal rhythm, S1 S2   Resp: clear, no rales  Abd:  NABS soft ntnd  Ext: no c/c/e  Vasc: 2+ radial, DP pulses. Cap refill < 3 sec. Periphery warm    Labs  Lab Results   Component Value Date    RBC 5.07 07/29/2022    HGB 14.4 07/29/2022    HCT 44.9 07/29/2022    MCV 88.6 07/29/2022    MCHC 32.0 07/29/2022    RDW 15.2 (H) 07/29/2022    PLT 271 07/29/2022     Lab Results   Component Value Date    BUN 18 12/03/2022    CREAT 1.1 12/03/2022    CL 106 12/03/2022    K 3.8 12/03/2022    [place] 9.0 12/03/2022    TBILI 0.6 12/03/2022    ALB 4.4 12/03/2022    AST 20 12/03/2022    ALK 54 12/03/2022    ALT 31 12/03/2022    GLU 142 (H) 12/03/2022     Lab Results   Component Value Date    INR 0.94 07/29/2022    PTT 28.5 07/29/2022       Lab Results   Component Value Date    BNP 21 03/27/2021     Lab Results   Component Value Date    A1C 7.7 (H) 12/03/2022     Lab Results   Component Value Date    CHOL 176 12/03/2022    LDLCALC 98 12/03/2022    TRIG 149 12/03/2022       Other Diagnostics  Coronary angiogram:  [facility] on 03/27/2021:  CONCLUSIONS:   1. Mid LAD with 50% stenosis. RFR of mid LAD was 0.95. This was physiologically non-significant.   2. LVEDP 20-25 mm Hg.     RECOMMENDATIONS:   1. Maximize medical therapy.   2. Aggressive risk factor modification efforts.   3. Monitor vitals and arterial access site/pulses.    Echocardiogram 03/27/21:   1. Mildly decreased left ventricular systolic function. The left ventricular ejection fraction is 51% by Biplane.   2. Left ventricular ejection fraction is 53% by 3D.   3. Mild concentric left ventricular hypertrophy.   4. Normal LV filling pressures.   5. Normal right ventricular size and systolic function.    Exercise stress echocardiogram 03/27/21:   1. Positive exercise stress test for ischemia.   2. Patient developed chest pain at peak excercise that improved with  recovery and rest.   3. Stage 1: 1: Mid and apical inferior wall is abnormal.      Stage 2: 2: Apical inferior segment is abnormal.   4. The peak estimated pulmonary artery systolic pressure during exercise was indeterminate.   5. Average functional capacity.    Assessment and plan:   Keith Cross is a 62 year old male      #Hypertension  -Suspect significant improvement due to initiating CPAP for sleep apnea  -Continue carvedilol 25 mg BID, BCTZ 25 mg daily nitroglycerin patch 0.1 mg/hr for now.    Discontinue hydralazine as may  be causing hypotension  -Amlodipine 10 mg daily, losartan 50 mg daily  -Will have start taking amlodipine at night to hopefully stablize his blood pressures in normal range  -Prior secondary hypertension work-up negative for hyperaldosteronism and pheochromocytoma.  Normal creatinine.  His left arm blood pressure was actually higher than his right arm blood pressure which does not suggest coarctation of the aorta.    -Bilateral renal artery stenosis unlikely with normal creatinine on ARB, no hemodynamically significant renal artery stenosis on duplex US  -Following with sleep medicine for treatment of severe OSA    #Coronary artery disease  -Moderate non obstructive disease found on coronary angiogram  -Aspirin 81 mg daily, carvedilol 25 mg BID, atorvastatin 80 mg daily, amlodipine 10 mg daily, nitroglycerin 0.1 mg/hr patch  -He had abnormal exercise stress echocardiogram and was referred for coronary angiogram as above which showed non obstructive disease  -Consider microvascular disease?  Control blood pressure as above.  Nitroglycerin 0.1 mg/hr patch.    -Will refer to cardiac rehab to improve exertional capacity    #Hyperlipidemia  -Goal LDL<70 mg/dL given CAD  -FLP 04/17/21: tc 167, hdl 47, ldL 93, TG 137  -FLP 06/19/21: TC 97, HDL 44, LDL 33, TG 98  -FLP 05/15/22: TC 170, HDL 60, LDL 92, TG 91  -FLP 07/28/22: TC 97, HDL 41, LDL 37, TG 46  -Atorvastatin 80 mg daily, ezetimibe 10 mg  daily  -Now at goal, continue    #Valve issue?  -Unclear issue with the valve.  TTE shows no significant valvular heart disease.  He has TTE in our system from 2016 showing aortic sclerosis with no other signficant valve disease    RTC in one month to address BP    I spent greater than 30 minutes on the day of the encounter reviewing the patient's diagnostic tests, performing a history and physical exam, writing orders, educating the patient, coordinating patient care with the staff, and documenting in the electronic medical records system.             ICD-10-CM ICD-9-CM    1. Benign hypertensive heart disease without heart failure  I11.9 402.10       2. Hyperlipidemia, unspecified hyperlipidemia type  E78.5 272.4       3. Chest pain, unspecified type  R07.9 786.50       4. Coronary artery disease involving native coronary artery of native heart without angina pectoris  I25.10 414.01       5. Aortic valve disorder  I35.9 424.1            There are no Patient Instructions on file for this visit.    Paul Half, MD  Health Sciences Assistant Clinical Professor  Division of Cardiology  Department of [place]

## 2023-01-26 ENCOUNTER — Ambulatory Visit: Payer: BC Managed Care – PPO | Attending: Cardiovascular Disease | Admitting: Cardiovascular Disease

## 2023-01-26 VITALS — BP 163/97 | HR 90 | Wt 175.6 lb

## 2023-01-26 DIAGNOSIS — I359 Nonrheumatic aortic valve disorder, unspecified: Secondary | ICD-10-CM | POA: Insufficient documentation

## 2023-01-26 DIAGNOSIS — E785 Hyperlipidemia, unspecified: Secondary | ICD-10-CM | POA: Insufficient documentation

## 2023-01-26 DIAGNOSIS — Z6828 Body mass index (BMI) 28.0-28.9, adult: Secondary | ICD-10-CM | POA: Insufficient documentation

## 2023-01-26 DIAGNOSIS — R079 Chest pain, unspecified: Secondary | ICD-10-CM

## 2023-01-26 DIAGNOSIS — I2510 Atherosclerotic heart disease of native coronary artery without angina pectoris: Secondary | ICD-10-CM | POA: Insufficient documentation

## 2023-01-26 DIAGNOSIS — I119 Hypertensive heart disease without heart failure: Secondary | ICD-10-CM

## 2023-01-27 ENCOUNTER — Telehealth: Payer: Self-pay

## 2023-01-27 NOTE — Telephone Encounter (Signed)
Called the patient via [name] [phone] and left the VM regarding upcoming appointment with Dr. Ronny Bacon on 02/13/2023 and needs to update new insurance information. Provided call back number [phone] to update new insurance information.   Colsen Modi(Judy), RN ( Nurse Navigator for Dr. Ronny Bacon)

## 2023-02-13 ENCOUNTER — Ambulatory Visit: Payer: BC Managed Care – PPO | Admitting: Surgery

## 2023-02-21 ENCOUNTER — Ambulatory Visit
Admission: RE | Admit: 2023-02-21 | Discharge: 2023-02-21 | Disposition: A | Discharge status: Home / Self Care | Payer: BC Managed Care – PPO | Attending: Surgery | Admitting: Surgery

## 2023-02-21 DIAGNOSIS — C73 Malignant neoplasm of thyroid gland: Secondary | ICD-10-CM | POA: Insufficient documentation

## 2023-02-27 ENCOUNTER — Encounter: Payer: Self-pay | Admitting: Surgery

## 2023-02-27 ENCOUNTER — Telehealth: Payer: Self-pay

## 2023-02-27 ENCOUNTER — Ambulatory Visit: Payer: BC Managed Care – PPO | Attending: Surgery | Admitting: Surgery

## 2023-02-27 VITALS — BP 153/101 | HR 73 | Temp 97.4°F | Resp 18 | Ht 66.0 in | Wt 173.1 lb

## 2023-02-27 DIAGNOSIS — Z6827 Body mass index (BMI) 27.0-27.9, adult: Secondary | ICD-10-CM | POA: Insufficient documentation

## 2023-02-27 DIAGNOSIS — C73 Malignant neoplasm of thyroid gland: Secondary | ICD-10-CM | POA: Insufficient documentation

## 2023-02-27 NOTE — Progress Notes (Signed)
Endocrine Surgery Follow Up Visit    Visit Date:  02/27/2023    [place] Interpretor: [place] #[id number]    HPI:  Keith Cross is a 62 year old year old male with PMH significant for CVA 2015, CAD, hypertension, hyperlipidemia, Type 2 DM, OSA on CPAP.      S/p right thyroid lobectomy on 08/01/22 for a 1.3 cm right thyroid nodule c/w papillary thyroid carcinoma. Procedure was uneventful and he was discharged home the same day.     08/15/22: Post op visit.  Pathology c/w 1 cm right sided papillary thyroid carcinoma, classic type, (-) margins, (-) extrathyroidal extension, (-) lymphovascular invasion, (+) BRAF.     Interval History:    Presents today for 6 month post op visit.Reports feeling well.    6 month surveillance [place] on 02/21/23 without evidence of recurrence.     Thyroid function tests in January WNL- not on thyroid hormone supplementation.    Physical Exam:  Vital Signs:  BP (!) 153/101 (BP Location: Left arm, BP Patient Position: Sitting, BP cuff size: Regular)   Pulse 73   Temp 97.4 F (36.3 C) (Temporal)   Resp 18   Ht 5\' 6"  (1.676 m)   Wt 78.5 kg (173 lb 1 oz)   BMI 27.93 kg/m     General: no acute distress, voice clear  Neck: well healed cervical incision; no palpable masses  CV: RRR  Pulm: non-labored breathing  Neuro: no focal deficits    Labs:     Latest Reference Range & Units 12/03/22 16:06   Renin ng/mL/hr 1.5   TSH 0.450 - 4.120 uIU/mL 2.651       Imaging:    THYROID/NECK SONOGRAM 02/21/2023       FINDINGS:     Thyroid isthmus: 0.2 cm in thickness  Right thyroid lobe: Removed  Left thyroid lobe: 4.6 x 2.0 x 1.2 cm     Background thyroid parenchyma: Homogeneous.     Thyroid nodules: No discrete thyroid nodules.     No morphologically abnormal or enlarged cervical chain lymph nodes.        IMPRESSION:     1.  Right thyroid lobe is surgically absent without abnormal findings in the surgical bed. The left thyroid lobe is unremarkable in appearance.       Pathology:    Case Report   Surgical Pathology                                 Case: [id number]                                   Authorizing Provider:  Runell Gess, MD         Collected:           08/01/2022 05:53 PM           Ordering Location:     [facility]                    Received:            08/02/2022 09:03 AM           Pathologist:           Saralyn Pilar, MD  Specimen:    Thyroid, Right Lobe, RIGHT THYROID LOBE SHORT STITCH- ISTHMUS, LONG STITCH- RIGHT                    UPPER POLE                                                                                Final Diagnosis   A.         RIGHT THYROID LOBE, LOBECTOMY:  -           Papillary thyroid carcinoma of the right lobe (1 cm), limited to the thyroid.  -           No obvious lymphovascular invasion seen (see CAP Cancer Protocol Summary below).  -           Surrounding multinodular hyperplastic parenchyma.      Electronically signed by Maris Berger, MD for Saralyn Pilar, MD on 08/08/2022 at 1256   Comment    Prior fine needle aspiration findings ([id number]) noted.  Multiple deeper levels were examined.      Synoptic Checklist   THYROID GLAND   8th Edition - Protocol posted: 3/22/2023THYROID GLAND: RESECTION - All Specimens  SPECIMEN   Procedure  Right lobectomy   TUMOR   Tumor Focality  Unifocal   Tumor Characteristics     Tumor Site  Right lobe   Tumor Size  Greatest Dimension (Centimeters): 1 cm   Additional Dimension (Centimeters)  0.5 cm     0.3 cm   Histologic Tumor Types and Subtypes  Papillary carcinoma, classic subtype   Tumor Proliferative Activity     Ki-67 Labeling Index  >3 %   Methodology  Manual count   Tumor Necrosis  Not identified   Angioinvasion (vascular invasion)  Not identified   Lymphatic Invasion  Not identified   Perineural Invasion  Not identified   Extrathyroidal Extension  Not identified   Margin Status  All margins negative for carcinoma   Distance from Invasive Carcinoma to Closest Margin  At least: 1  mm   Margin Comment  Right posterior (Block: A1)   REGIONAL LYMPH NODES   Regional Lymph Node Status  Not applicable (no regional lymph nodes submitted or found)   pTNM CLASSIFICATION (AJCC 8th Edition)   Reporting of pT, pN, and (when applicable) pM categories is based on information available to the pathologist at the time the report is issued. As per the AJCC (Chapter 1, 8th Ed.) it is the managing physician's responsibility to establish the final pathologic stage based upon all pertinent information, including but potentially not limited to this pathology report.        pT Category  pT1a   pN Category  pN not assigned (no nodes submitted or found)   Comment(s)  BRAF positive          Assessment:  Keith Cross is a 62 year old year old male with PMH significant for CVA 2015, CAD, hypertension, hyperlipidemia, Type 2 DM, OSA on CPAP.      S/p right thyroid lobectomy on 08/01/22 for a 1.3 cm right thyroid nodule c/w papillary thyroid carcinoma. Procedure was uneventful and he  was discharged home the same day.     Pathology c/w 1 cm right sided papillary thyroid carcinoma, classic type, (-) margins, (-) extrathyroidal extension, (-) lymphovascular invasion, (+) BRAF.    6 month surveillance [place] on 02/21/23 without evidence of recurrence.     Thyroid function tests in January WNL- not on thyroid hormone supplementation.      Plan:    Labs and imaging reviewed with patient  Surveillance [place] in 6 months (1 year post op)  Follow up with me in 6 months to review [place]  Continue to follow with endocrinology Dr. Kathlene Cote L. Baxter Flattery, MD  Assistant Clinical Professor of Surgery  [facility]

## 2023-02-27 NOTE — Telephone Encounter (Signed)
Called patient and just checked in at [facility] [address].   Kahlani Graber(Judy), RN ( Nurse Navigator for Dr. Baxter Flattery)

## 2023-03-16 ENCOUNTER — Ambulatory Visit: Payer: BC Managed Care – PPO | Admitting: Cardiovascular Disease

## 2023-04-01 ENCOUNTER — Encounter: Payer: Self-pay | Admitting: Student in an Organized Health Care Education/Training Program

## 2023-04-01 ENCOUNTER — Ambulatory Visit
Payer: BC Managed Care – PPO | Attending: Student in an Organized Health Care Education/Training Program | Admitting: Student in an Organized Health Care Education/Training Program

## 2023-04-01 ENCOUNTER — Other Ambulatory Visit
Admission: RE | Admit: 2023-04-01 | Discharge: 2023-04-01 | Disposition: A | Discharge status: Home Health | Payer: BC Managed Care – PPO

## 2023-04-01 VITALS — BP 149/97 | HR 89 | Ht 66.0 in | Wt 174.1 lb

## 2023-04-01 DIAGNOSIS — E1159 Type 2 diabetes mellitus with other circulatory complications: Secondary | ICD-10-CM | POA: Insufficient documentation

## 2023-04-01 DIAGNOSIS — C73 Malignant neoplasm of thyroid gland: Secondary | ICD-10-CM

## 2023-04-01 DIAGNOSIS — Z6828 Body mass index (BMI) 28.0-28.9, adult: Secondary | ICD-10-CM | POA: Insufficient documentation

## 2023-04-01 LAB — LIPID(CHOL FRACT) PANEL, BLOOD
Cholesterol: 167 MG/DL (ref <200)
HDL Cholesterol: 53 MG/DL (ref >40)
LDL Cholesterol (calc): 99 MG/DL (ref <160)
Non HDL Cholesterol (calculated): 114 MG/DL (ref <130)
Triglycerides: 76 MG/DL (ref <150)
VLDL Cholesterol (calculated): 15 MG/DL

## 2023-04-01 LAB — COMPREHENSIVE METABOLIC PANEL, BLOOD
ALT: 45 U/L (ref 7–52)
AST: 32 U/L (ref 13–39)
Albumin: 4.3 G/DL (ref 4.2–5.5)
Alk Phos: 59 U/L (ref 34–104)
BUN: 12 mg/dL (ref 7–25)
Bilirubin, Total: 0.8 mg/dL (ref 0.0–1.4)
CO2: 24 mmol/L (ref 21–31)
Calcium: 9 mg/dL (ref 8.6–10.3)
Chloride: 103 mmol/L (ref 98–107)
Creat: 1.1 mg/dL (ref 0.7–1.3)
Electrolyte Balance: 10 mmol/L (ref 2–12)
Glucose: 127 mg/dL — ABNORMAL HIGH (ref 85–125)
Potassium: 3.5 mmol/L (ref 3.5–5.1)
Protein, Total: 7.4 G/DL (ref 6.0–8.3)
Sodium: 137 mmol/L (ref 136–145)
eGFR - high estimate: >60 (ref >59)
eGFR - low estimate: >60 (ref >59)

## 2023-04-01 LAB — RANDOM URINE MICROALB/CREAT RATIO PANEL
Creatinine, G/L: 1.2 g/L
Microalbumin/Creatinine Ratio: 25.8 MG MICAL/GM CRE
Microalbumin: 31 MG/L

## 2023-04-01 LAB — FREE THYROXINE, BLOOD: Free T4: 0.94 ng/dL (ref 0.60–1.12)

## 2023-04-01 LAB — TSH, BLOOD: TSH, Ultrasensitive: 2.501 u[IU]/mL (ref 0.450–4.120)

## 2023-04-01 LAB — HEMOGLOBIN A1C, POINT OF CARE TESTING: Hemoglobin A1c, POC: 7.8 % — ABNORMAL HIGH (ref 4.0–5.6)

## 2023-04-01 NOTE — Progress Notes (Signed)
ENDOCRINOLOGY OUTPATIENT VISIT - [facility]    Date and Time of Evaluation:  Wednesday Apr 01, 2023    Primary Care Physician:  Hoover Brunette [facility]     Chief Complaint:   Chief Complaint   Patient presents with    Follow Up       History of Present Illness:    Keith Cross is a 62 year old male with CVA 2015, HTN, HLD, and T2DM, here for evaluation and management of adrenal nodule.    Subcentimeter adrenal nodule (0.9 cm) on 10/2020 CTAP with 28 HU and absolute/relative contrast washout of 74%/42%, c/w benign adrenal adenoma.     Was seen by Dr. Billy Fischer of Endocrine Surgery given history of uncontrolled hypertension and previous hormonal w/up with elevated plasma metanephrines 62 (< 57) and normetanephrines 252 (< 148), though less than 2x ULN. Aldosterone level was normal at 8. Repeat labs with Dr. Baxter Flattery with unremarkable plasma metanephrines (normetanephrines very minimally elevated at 0.98, normal < 0.9). 24-hour urine metanephrines were wnl. Aldosterone/renin wnl with normal dexamethasone suppression test.      Currently on multiple hypertensive agents, including clonidine, carvedilol, hydralazine, isosorbide mononitrate, amlodipine, losartan, metoprolol, and furosemide. BP 181/108 in clinic today.     Patient does endorse headaches in the morning and late evening. He does not have a Neurologist. Also endorses episodes of palpitations, diaphoresis, SOB, and chest pain. Advised patient he needs to present to ER if he has these symptoms. Has Cardiology referral. Reports diarrhea with mucus in stool once every few weeks.     04/01/2023 follow up:     Currently on Mounjaro 7.5 mg weekly. Tolerating well.     Adrenal workup all unremarkable with stable nodule on imaging.    Stable thyroid US in 02/2023.     Having two bowls of rice daily.     Past Medical/Surgical History:  Past Medical History:   Diagnosis Date    History of CVA (cerebrovascular accident)     Hyperlipidemia     Hypertension     Type 2 diabetes  mellitus (CMS-HCC)        Family History:  No family history on file.    Social History:  Social History     Socioeconomic History    Marital status: Married   Tobacco Use    Smoking status: Never    Smokeless tobacco: Never   Substance and Sexual Activity    Alcohol use: Not Currently     Social Determinants of Health     Financial Resource Strain: Low Risk  (05/21/2022)    Received from [facility] and [facility] [place]    Overall [employer], medical Strain (CARDIA)     Difficulty of Paying Living Expenses: Not very hard    Received from [facility]       Allergies:  No Known Allergies    Medications:  Current Outpatient Medications on File Prior to Visit   Medication Sig Dispense Refill    acetaminophen (TYLENOL) 325 MG tablet Take 2 tablets (650 mg) by mouth.      albuterol 108 (90 Base) MCG/ACT inhaler TAKE 2 PUFFS EVERY 4 HOURS      allopurinol (ZYLOPRIM) 300 MG tablet Take 1 tablet (300 mg) by mouth daily.      amLODIPINE (NORVASC) 10 MG tablet Take 1 tablet (10 mg) by mouth daily. 90 tablet 3    aspirin 81 MG EC tablet Take 1 tablet (81  mg) by mouth daily. 90 tablet 3    atorvastatin (LIPITOR) 80 MG tablet Take 1 tablet (80 mg) by mouth daily. 90 tablet 3    Blood Glucose Monitoring Suppl (TRUE METRIX METER) w/Device KIT As Directed.      carvedilol (COREG) 25 MG tablet Take 1 tablet (25 mg) by mouth 2 times daily (with meals). 180 tablet 3    colchicine 0.6 MG tablet TAKE 2 TABLETS NOW AND TAKE 1 TABLET 1 HOUR LATER FOR 1 DAY THEN TWICE A DAY      empagliflozin (JARDIANCE) 25 mg tablet Take 1 tablet (25 mg) by mouth daily. 90 tablet 3    ezetimibe (ZETIA) 10 MG tablet Take 1 tablet (10 mg) by mouth daily. 90 tablet 3    furosemide (LASIX) 40 MG tablet TAKE 1 TABLET BY MOUTH EVERY DAY IN THE MORNING      galcanezumab-gnlm (EMGALITY) 120 mg/mL injection pen Inject 1 mL (120 mg) under the skin every 30 days. 1 mL 11    hydrALAZINE (APRESOLINE) 25 MG tablet Take 1 tablet  (25 mg) by mouth as needed (Take if SBP>160, up to twice daily). 90 tablet 3    isosorbide mononitrate (IMDUR) 60 MG Controlled-Release tablet       losartan (COZAAR) 25 MG tablet       losartan (COZAAR) 50 MG tablet Take 1 tablet (50 mg) by mouth daily. 90 tablet 3    meclizine (ANTIVERT) 25 MG tablet       metFORMIN (GLUCOPHAGE) 1000 mg tablet Take 1 tablet (1,000 mg) by mouth 2 times daily.      nitroGLYcerin (NITRODUR) 0.1 MG/HR patch       ondansetron (ZOFRAN ODT) 4 MG disintegrating tablet TAKE 1 TABLET BY MOUTH EVERY 6 HOURS AS NEEDED FOR NAUSEA OR VOMITING.      pantoprazole (PROTONIX) 40 MG tablet Take 1 tablet (40 mg) by mouth daily.      pioglitazone (ACTOS) 30 mg tablet Take 1 tablet (30 mg) by mouth.      potassium chloride (KLOR-CON M20) 20 MEQ tablet TAKE 1 TABLET DAILY IN THE MORNING      rimegepant (NURTEC ODT) tablet Dissolve 1 tablet (75 mg) on or under the tongue daily as needed (to treat migraines). Do not take more than one tablet per day. 16 tablet 11    Tirzepatide (MOUNJARO) 10 MG/0.5ML injection pen Inject 0.5 mL (10 mg) under the skin every 7 days. Inject 10 mg once a week for 4 weeks, then increase to 12.5 mg weekly 2 mL 0    Tirzepatide (MOUNJARO) 12.5 MG/0.5ML injection pen Inject 0.5 mL (12.5 mg) under the skin every 7 days. Inject 12.5 mg once a week for 4 weeks, then increase to 15 mg weekly 2 mL 0    Tirzepatide (MOUNJARO) 15 MG/0.5ML injection pen Inject 0.5 mL (15 mg) under the skin every 7 days. After 4 weeks of the 12.5 mg/week dose, increase to 15 mg weekly. This is the maintenance dose. 2 mL 11    Tirzepatide (MOUNJARO) 2.5 MG/0.5ML injection pen Inject 0.5 mL (2.5 mg) under the skin every 7 days. for 28 days. Then start the 5 mg into the skin every 7 days. 2 mL 0    Tirzepatide (MOUNJARO) 5 MG/0.5ML injection pen Inject 0.5 mL (5 mg) under the skin every 7 days. Inject 5 mg every week for 4 weeks, then increase to 7.5 mg weekly 2 mL 0    Tirzepatide (MOUNJARO) 7.5 MG/0.5ML  injection pen Inject 0.5 mL (7.5 mg) under the skin every 7 days. Inject 7.5 mg once a week for 4 weeks, then increase to 10 mg weekly 2 mL 0    topiramate (TOPAMAX) 50 MG tablet Take 1 tablet (50 mg) by mouth 2 times daily.      TRUE METRIX BLOOD GLUCOSE TEST test strip CHECK BLOOD SUGAR ONCE A DAY      TRUEplus Lancets 30G MISC CHECK BLOOD SUGAR ONCE A DAY      venlafaxine XR (EFFEXOR XR) 75 MG capsule Take 1 capsule (75 mg) by mouth daily. 90 capsule 1     Current Facility-Administered Medications on File Prior to Visit   Medication Dose Route Frequency Provider Last Rate Last Admin    nitroGLYcerin (NITROLINGUAL) spray 1 spray  1 spray Sublingual Q5 Min PRN [name], MD   3 spray at 03/27/21 1439       Physical Exam:  Vital Signs: BP (!) 149/97   Pulse 89   Ht 5\' 6"  (1.676 m)   Wt 79 kg (174 lb 1.6 oz)   BMI 28.10 kg/m     General:  No acute distress  HEENT: No proptosis, lid lag, or periorbital edema. No scleral icterus   Lungs: Symmetric chest rise, normal work of breathing  Neurological: Alerted and oriented x 4  Skin:  No visible lesions  Psych: Appropriate mood and affect    Diagnostic Data:    Laboratory Data:    Lab Results   Component Value Date    HGB 14.4 07/29/2022    HCT 44.9 07/29/2022    PLT 271 07/29/2022    RBC 5.07 07/29/2022    MCV 88.6 07/29/2022    RDW 15.2 (H) 07/29/2022    NEUTP 56.2 07/29/2022    NEUTAB 3.5 07/29/2022    EOSAB 0.3 07/29/2022       Lab Results   Component Value Date/Time    SODIUM 138 12/03/2022 04:06 PM    K 3.8 12/03/2022 04:06 PM    CL 106 12/03/2022 04:06 PM    CO2 24 12/03/2022 04:06 PM    BUN 18 12/03/2022 04:06 PM    CREAT 1.1 12/03/2022 04:06 PM    GLU 142 (H) 12/03/2022 04:06 PM    [place] 9.0 12/03/2022 04:06 PM    MG 2.0 03/28/2021 08:28 AM    PHOS 3.9 03/28/2021 08:28 AM    TPROT 7.1 12/03/2022 04:06 PM    ALB 4.4 12/03/2022 04:06 PM    ALK 54 12/03/2022 04:06 PM    TBILI 0.6 12/03/2022 04:06 PM    AST 20 12/03/2022 04:06 PM    ALT 31 12/03/2022 04:06 PM        Lab Results   Component Value Date/Time    A1C 7.8 (H) 04/01/2023 03:15 PM    A1C 7.7 (H) 12/03/2022 04:06 PM       Lab Results   Component Value Date/Time    TSHHS 2.651 12/03/2022 04:06 PM    FREET4 1.02 12/03/2022 04:06 PM     02/2023 Thyroid US    IMPRESSION:     1.  Right thyroid lobe is surgically absent without abnormal findings in the surgical bed. The left thyroid lobe is unremarkable in appearance.     END     06/18/2022 Thyroid FNA        07/2022 Right Lobectomy    A.         RIGHT THYROID LOBE, LOBECTOMY:  -  Papillary thyroid carcinoma of the right lobe (1 cm), limited to the thyroid.  -           No obvious lymphovascular invasion seen (see CAP Cancer Protocol Summary below).  -           Surrounding multinodular hyperplastic parenchyma.      Electronically signed by Maris Berger, MD for Saralyn Pilar, MD on 08/08/2022 at 1256   Comment    Prior fine needle aspiration findings ([id number]) noted.  Multiple deeper levels were examined.      Synoptic Checklist   THYROID GLAND   8th Edition - Protocol posted: 3/22/2023THYROID GLAND: RESECTION - All Specimens  SPECIMEN   Procedure  Right lobectomy   TUMOR   Tumor Focality  Unifocal   Tumor Characteristics     Tumor Site  Right lobe   Tumor Size  Greatest Dimension (Centimeters): 1 cm   Additional Dimension (Centimeters)  0.5 cm     0.3 cm   Histologic Tumor Types and Subtypes  Papillary carcinoma, classic subtype   Tumor Proliferative Activity     Ki-67 Labeling Index  >3 %   Methodology  Manual count   Tumor Necrosis  Not identified   Angioinvasion (vascular invasion)  Not identified   Lymphatic Invasion  Not identified   Perineural Invasion  Not identified   Extrathyroidal Extension  Not identified   Margin Status  All margins negative for carcinoma   Distance from Invasive Carcinoma to Closest Margin  At least: 1 mm   Margin Comment  Right posterior (Block: A1)   REGIONAL LYMPH NODES   Regional Lymph Node Status  Not applicable (no  regional lymph nodes submitted or found)   pTNM CLASSIFICATION (AJCC 8th Edition)   Reporting of pT, pN, and (when applicable) pM categories is based on information available to the pathologist at the time the report is issued. As per the AJCC (Chapter 1, 8th Ed.) it is the managing physician's responsibility to establish the final pathologic stage based upon all pertinent information, including but potentially not limited to this pathology report.        pT Category  pT1a   pN Category  pN not assigned (no nodes submitted or found)   Comment(s)  BRAF positive   .      Immunohistochemistry Interpretation    A2        BRAF                Positive              ERG                  Positive in endothelial cells              Ki-67                Slightly elevated: 3~5% of tumor cells         Impression:  Keith Cross is a 62 year old male with CVA 2015, HTN, HLD, and T2DM, here for evaluation and management of adrenal nodule.    # Papillary thyroid cancer  - 1.3 cm right TR 5 nodule s/p FNA c/w PTC in 06/2022  - S/p right lobectomy with Dr. Baxter Flattery with 1 cm unifocal classic PTC without concerning features  - No plan for completion at this time  - Normal TFTS 11/2022  - Stable thyroid US 02/2023    # T2DM   - A1c 7.8%  in 03/2023, goal < 8% without hypoglycemia given hx of CAD  - MTF 1g BID  - Jardiance 25 mg daily   - Actos 30 mg  - Continue Mounjaro taper as tolerated    # Adrenal nodule  - Initially detected on 10/2020 CTAP with 28 HU and absolute/relative contrast washout of 74%/42%, c/w benign adrenal adenoma  - LNSC, 24-hour UFC, renin/aldo, and metanephrines all unremarkable 1-12/2022  - Stable nodule on 12/2022 CT abdomen   - Given stable labs and imaging, will hold of on further monitoring at this time given size < 1 cm     #HTN  - Refractory despite multiple antihypertensives  - Negative workup for hyperaldosteronism and pheochromocytoma.   - On CPAP  - Continue f/u with Cardiology     RTC in 4 months with labs  prior    Greggory Stallion, MD

## 2023-04-02 LAB — GLYCOSYLATED HGB(A1C), BLOOD: Glycated Hgb, A1C: 7.5 % — ABNORMAL HIGH (ref 4.6–5.6)

## 2023-04-20 NOTE — Progress Notes (Unsigned)
[facility] Cardiology Clinic Note    Patient: Keith Cross  Patient DOB: [birth year]  Patient ID: [id number]  Patient PCP: Suzan Slick  Date of Service: 04/23/2023    Reason for Visit  No chief complaint on file.       HPI  Keith Cross is a 62 year old male    Interval History  Patients last seen 01/26/23    Has been recording his blood pressures.  In general, higher in the morning with average of 140s-150s systolic with some nadirs at night to as low as 70s-90s systolic.  Takes all of his blood pressure medications in the morning    Still having chest discomfort, tried to contact cardiac rehab but reports was not able to get in contact with them    Problem List  Patient Active Problem List    Diagnosis Date Noted    Papillary thyroid carcinoma (CMS-HCC) 07/25/2022    Cerebral cavernoma 07/04/2021    OSA (obstructive sleep apnea) 04/17/2021    Chest pain 03/27/2021    Abnormal stress echo 03/27/2021        PMH  Past Medical History:   Diagnosis Date    History of CVA (cerebrovascular accident)     Hyperlipidemia     Hypertension     Type 2 diabetes mellitus (CMS-HCC)        Meds  Current Outpatient Medications   Medication Sig    acetaminophen (TYLENOL) 325 MG tablet Take 2 tablets (650 mg) by mouth.    albuterol 108 (90 Base) MCG/ACT inhaler TAKE 2 PUFFS EVERY 4 HOURS    allopurinol (ZYLOPRIM) 300 MG tablet Take 1 tablet (300 mg) by mouth daily.    amLODIPINE (NORVASC) 10 MG tablet Take 1 tablet (10 mg) by mouth daily.    aspirin 81 MG EC tablet Take 1 tablet (81 mg) by mouth daily.    atorvastatin (LIPITOR) 80 MG tablet Take 1 tablet (80 mg) by mouth daily.    Blood Glucose Monitoring Suppl (TRUE METRIX METER) w/Device KIT As Directed.    carvedilol (COREG) 25 MG tablet Take 1 tablet (25 mg) by mouth 2 times daily (with meals).    colchicine 0.6 MG tablet TAKE 2 TABLETS NOW AND TAKE 1 TABLET 1 HOUR LATER FOR 1 DAY THEN TWICE A DAY    empagliflozin (JARDIANCE) 25 mg tablet Take 1 tablet (25 mg) by mouth daily.     ezetimibe (ZETIA) 10 MG tablet Take 1 tablet (10 mg) by mouth daily.    furosemide (LASIX) 40 MG tablet TAKE 1 TABLET BY MOUTH EVERY DAY IN THE MORNING    galcanezumab-gnlm (EMGALITY) 120 mg/mL injection pen Inject 1 mL (120 mg) under the skin every 30 days.    hydrALAZINE (APRESOLINE) 25 MG tablet Take 1 tablet (25 mg) by mouth as needed (Take if SBP>160, up to twice daily).    isosorbide mononitrate (IMDUR) 60 MG Controlled-Release tablet     losartan (COZAAR) 25 MG tablet     losartan (COZAAR) 50 MG tablet Take 1 tablet (50 mg) by mouth daily.    meclizine (ANTIVERT) 25 MG tablet     metFORMIN (GLUCOPHAGE) 1000 mg tablet Take 1 tablet (1,000 mg) by mouth 2 times daily.    nitroGLYcerin (NITRODUR) 0.1 MG/HR patch     ondansetron (ZOFRAN ODT) 4 MG disintegrating tablet TAKE 1 TABLET BY MOUTH EVERY 6 HOURS AS NEEDED FOR NAUSEA OR VOMITING.    pantoprazole (PROTONIX) 40 MG tablet Take 1 tablet (40  mg) by mouth daily.    pioglitazone (ACTOS) 30 mg tablet Take 1 tablet (30 mg) by mouth.    potassium chloride (KLOR-CON M20) 20 MEQ tablet TAKE 1 TABLET DAILY IN THE MORNING    rimegepant (NURTEC ODT) tablet Dissolve 1 tablet (75 mg) on or under the tongue daily as needed (to treat migraines). Do not take more than one tablet per day.    Tirzepatide [facility]) 10 MG/0.5ML injection pen Inject 0.5 mL (10 mg) under the skin every 7 days. Inject 10 mg once a week for 4 weeks, then increase to 12.5 mg weekly    Tirzepatide (MOUNJARO) 12.5 MG/0.5ML injection pen Inject 0.5 mL (12.5 mg) under the skin every 7 days. Inject 12.5 mg once a week for 4 weeks, then increase to 15 mg weekly    Tirzepatide (MOUNJARO) 15 MG/0.5ML injection pen Inject 0.5 mL (15 mg) under the skin every 7 days. After 4 weeks of the 12.5 mg/week dose, increase to 15 mg weekly. This is the maintenance dose.    Tirzepatide [facility]) 2.5 MG/0.5ML injection pen Inject 0.5 mL (2.5 mg) under the skin every 7 days. for 28 days. Then start the 5 mg into the skin  every 7 days.    Tirzepatide [facility]) 5 MG/0.5ML injection pen Inject 0.5 mL (5 mg) under the skin every 7 days. Inject 5 mg every week for 4 weeks, then increase to 7.5 mg weekly    Tirzepatide (MOUNJARO) 7.5 MG/0.5ML injection pen Inject 0.5 mL (7.5 mg) under the skin every 7 days. Inject 7.5 mg once a week for 4 weeks, then increase to 10 mg weekly    topiramate (TOPAMAX) 50 MG tablet Take 1 tablet (50 mg) by mouth 2 times daily.    TRUE METRIX BLOOD GLUCOSE TEST test strip CHECK BLOOD SUGAR ONCE A DAY    TRUEplus Lancets 30G MISC CHECK BLOOD SUGAR ONCE A DAY    venlafaxine XR (EFFEXOR XR) 75 MG capsule Take 1 capsule (75 mg) by mouth daily.     Current Facility-Administered Medications   Medication    nitroGLYcerin (NITROLINGUAL) spray 1 spray       All  No Known Allergies    ROS  A comprehensive review of 12 organ systems was performed and reviewed with the patient. Review of systems with the patient was negative except as indicated in the HPI above.    SH  Social History     Socioeconomic History    Marital status: Married     Spouse name: Not on file    Number of children: Not on file    Years of education: Not on file    Highest education level: Not on file   Occupational History    Not on file   Tobacco Use    Smoking status: Never    Smokeless tobacco: Never   Substance and Sexual Activity    Alcohol use: Not Currently    Drug use: Not on file    Sexual activity: Not on file   Other Topics Concern    Not on file   Social History Narrative    Not on file     Social Determinants of Health     Financial Resource Strain: Low Risk  (05/21/2022)    Received from [facility] [place]    Overall [employer] (CARDIA)     Difficulty of Paying Living Expenses: Not very hard   Food Insecurity: Not on file  Transportation Needs: Not on file   Physical Activity: Not on file   Stress: Not on file   Social Connections: Not on file   Intimate Partner Violence: Not on file    Housing Stability: Not At Risk (06/14/2022)    Received from [facility] Stability     Housing Stability: Not on file     Housing Stability: Not on file     FH  No family history on file.    Past Surgical History:  No date: Left knee surgery     Physical Exam  There were no vitals filed for this visit.  Gen: pleasant, in NAD  Neuro: A&Ox3, no focal deficit  HEENT:  NC/AT. MMM  Neck: no bruit. JVD   CV: regular rate, normal rhythm, S1 S2   Resp: clear, no rales  Abd:  NABS soft ntnd  Ext: no c/c/e  Vasc: 2+ radial, DP pulses. Cap refill < 3 sec. Periphery warm    Labs  Lab Results   Component Value Date    RBC 5.07 07/29/2022    HGB 14.4 07/29/2022    HCT 44.9 07/29/2022    MCV 88.6 07/29/2022    MCHC 32.0 07/29/2022    RDW 15.2 (H) 07/29/2022    PLT 271 07/29/2022     Lab Results   Component Value Date    BUN 12 04/01/2023    CREAT 1.1 04/01/2023    CL 103 04/01/2023    K 3.5 04/01/2023    [place] 9.0 04/01/2023    TBILI 0.8 04/01/2023    ALB 4.3 04/01/2023    AST 32 04/01/2023    ALK 59 04/01/2023    ALT 45 04/01/2023    GLU 127 (H) 04/01/2023     Lab Results   Component Value Date    INR 0.94 07/29/2022    PTT 28.5 07/29/2022       Lab Results   Component Value Date    BNP 21 03/27/2021     Lab Results   Component Value Date    A1C 7.5 (H) 04/01/2023     Lab Results   Component Value Date    CHOL 167 04/01/2023    LDLCALC 99 04/01/2023    TRIG 76 04/01/2023       Other Diagnostics  Coronary angiogram:  [facility] on 03/27/2021:  CONCLUSIONS:   1. Mid LAD with 50% stenosis. RFR of mid LAD was 0.95. This was physiologically non-significant.   2. LVEDP 20-25 mm Hg.     RECOMMENDATIONS:   1. Maximize medical therapy.   2. Aggressive risk factor modification efforts.   3. Monitor vitals and arterial access site/pulses.    Echocardiogram 03/27/21:   1. Mildly decreased left ventricular systolic function. The left ventricular ejection fraction is 51% by Biplane.   2. Left ventricular ejection fraction is 53% by 3D.   3.  Mild concentric left ventricular hypertrophy.   4. Normal LV filling pressures.   5. Normal right ventricular size and systolic function.    Exercise stress echocardiogram 03/27/21:   1. Positive exercise stress test for ischemia.   2. Patient developed chest pain at peak excercise that improved with recovery and rest.   3. Stage 1: 1: Mid and apical inferior wall is abnormal.      Stage 2: 2: Apical inferior segment is abnormal.   4. The peak estimated pulmonary artery systolic pressure during exercise was indeterminate.   5. Average functional capacity.  Assessment and plan:   Keith Cross is a 62 year old male      #Hypertension  -Suspect significant improvement due to initiating CPAP for sleep apnea  -Continue carvedilol 25 mg BID, BCTZ 25 mg daily nitroglycerin patch 0.1 mg/hr for now.    Discontinue hydralazine as may be causing hypotension  -Amlodipine 10 mg daily, losartan 50 mg daily  -Will have start taking amlodipine at night to hopefully stablize his blood pressures in normal range  -Prior secondary hypertension work-up negative for hyperaldosteronism and pheochromocytoma.  Normal creatinine.  His left arm blood pressure was actually higher than his right arm blood pressure which does not suggest coarctation of the aorta.    -Bilateral renal artery stenosis unlikely with normal creatinine on ARB, no hemodynamically significant renal artery stenosis on duplex US  -Following with sleep medicine for treatment of severe OSA    #Coronary artery disease  -Moderate non obstructive disease found on coronary angiogram  -Aspirin 81 mg daily, carvedilol 25 mg BID, atorvastatin 80 mg daily, amlodipine 10 mg daily, nitroglycerin 0.1 mg/hr patch  -He had abnormal exercise stress echocardiogram and was referred for coronary angiogram as above which showed non obstructive disease  -Consider microvascular disease?  Control blood pressure as above.  Nitroglycerin 0.1 mg/hr patch.    -Will refer to cardiac rehab to improve  exertional capacity    #Hyperlipidemia  -Goal LDL<70 mg/dL given CAD  -FLP 07/17/03: tc 167, hdl 47, ldL 93, TG 137  -FLP 06/19/21: TC 97, HDL 44, LDL 33, TG 98  -FLP 05/15/22: TC 170, HDL 60, LDL 92, TG 91  -FLP 07/28/22: TC 97, HDL 41, LDL 37, TG 46  -Atorvastatin 80 mg daily, ezetimibe 10 mg daily  -Now at goal, continue    #Valve issue?  -Unclear issue with the valve.  TTE shows no significant valvular heart disease.  He has TTE in our system from 2016 showing aortic sclerosis with no other signficant valve disease    RTC in one month to address BP    I spent greater than 30 minutes on the day of the encounter reviewing the patient's diagnostic tests, performing a history and physical exam, writing orders, educating the patient, coordinating patient care with the staff, and documenting in the electronic medical records system.             ICD-10-CM ICD-9-CM    1. Benign hypertensive heart disease without heart failure  I11.9 402.10       2. Hyperlipidemia, unspecified hyperlipidemia type  E78.5 272.4       3. Chest pain, unspecified type  R07.9 786.50       4. Coronary artery disease involving native coronary artery of native heart without angina pectoris  I25.10 414.01       5. Aortic valve disorder  I35.9 424.1            There are no Patient Instructions on file for this visit.    [name], MD  Health Sciences Assistant Clinical Professor  Division of Cardiology  Department of Medicine  [facility]

## 2023-04-23 ENCOUNTER — Ambulatory Visit: Payer: BC Managed Care – PPO | Attending: Cardiovascular Disease | Admitting: Cardiovascular Disease

## 2023-04-23 VITALS — BP 148/101 | HR 75 | Resp 20 | Ht 67.0 in | Wt 173.2 lb

## 2023-04-23 DIAGNOSIS — I119 Hypertensive heart disease without heart failure: Secondary | ICD-10-CM

## 2023-04-23 DIAGNOSIS — E785 Hyperlipidemia, unspecified: Secondary | ICD-10-CM | POA: Insufficient documentation

## 2023-04-23 DIAGNOSIS — I2510 Atherosclerotic heart disease of native coronary artery without angina pectoris: Secondary | ICD-10-CM

## 2023-04-23 DIAGNOSIS — Z6827 Body mass index (BMI) 27.0-27.9, adult: Secondary | ICD-10-CM | POA: Insufficient documentation

## 2023-04-23 DIAGNOSIS — Z01810 Encounter for preprocedural cardiovascular examination: Secondary | ICD-10-CM | POA: Insufficient documentation

## 2023-04-23 DIAGNOSIS — I359 Nonrheumatic aortic valve disorder, unspecified: Secondary | ICD-10-CM

## 2023-04-23 DIAGNOSIS — R079 Chest pain, unspecified: Secondary | ICD-10-CM | POA: Insufficient documentation

## 2023-04-23 MED ORDER — NITROGLYCERIN 0.1 MG/HR TD PT24
1.0000 | MEDICATED_PATCH | Freq: Every day | TRANSDERMAL | 0 refills | Status: AC
Start: 2023-04-23 — End: ?

## 2023-04-23 MED ORDER — REPATHA 140 MG/ML SC SOSY
140.0000 mg | PREFILLED_SYRINGE | SUBCUTANEOUS | 3 refills | Status: AC
Start: 2023-04-23 — End: ?

## 2023-04-23 MED ORDER — HYDROCHLOROTHIAZIDE 25 MG OR TABS: 25.00 mg | ORAL_TABLET | Freq: Every day | ORAL | Status: AC

## 2023-04-23 MED ORDER — LOSARTAN POTASSIUM 25 MG OR TABS
75.0000 mg | ORAL_TABLET | Freq: Every day | ORAL | 3 refills | Status: DC
Start: 2023-04-23 — End: 2023-07-08

## 2023-04-23 NOTE — Interdisciplinary (Signed)
Interpreter number [phone]

## 2023-04-23 NOTE — Patient Instructions (Signed)
Please contact cardiac rehab at [phone]

## 2023-04-28 ENCOUNTER — Telehealth: Payer: Self-pay | Admitting: Student in an Organized Health Care Education/Training Program

## 2023-04-28 ENCOUNTER — Other Ambulatory Visit: Payer: Self-pay | Admitting: Student in an Organized Health Care Education/Training Program

## 2023-04-28 DIAGNOSIS — E1159 Type 2 diabetes mellitus with other circulatory complications: Secondary | ICD-10-CM

## 2023-04-28 MED ORDER — MOUNJARO 2.5 MG/0.5ML SC SOPN
2.5000 mg | PEN_INJECTOR | SUBCUTANEOUS | 0 refills | Status: AC
Start: 2023-04-28 — End: ?

## 2023-04-28 NOTE — Telephone Encounter (Signed)
Christine from the pharmacy is  requesting prescription refill for   Medication and Dosage: Tirzepatide [facility]) 2.5 MG/0.5ML injection pen  to be filled at:   Pharmacy:   [facility], [place] - [address] [phone]    Do you have less than 5 days of meds remaining? Yes    Does the patient/caller request to be called once refill is sent to the pharmacy? Don't Know

## 2023-04-28 NOTE — Telephone Encounter (Signed)
Rx pending for MD in refill encounter

## 2023-04-30 NOTE — Telephone Encounter (Signed)
Lvm informing pt prescription has been sent in.

## 2023-05-29 ENCOUNTER — Telehealth: Payer: Self-pay | Admitting: Neurosurgery

## 2023-05-29 NOTE — Telephone Encounter (Signed)
Patient is scheduled for an appointment on 06/04/23. He would like to know if he needs any imaging done prior to the appointment?     Please assist, thank you.

## 2023-06-04 ENCOUNTER — Ambulatory Visit: Payer: BC Managed Care – PPO | Admitting: Neurosurgery

## 2023-06-22 ENCOUNTER — Ambulatory Visit
Admit: 2023-06-22 | Discharge: 2023-06-22 | Disposition: A | Discharge status: Home / Self Care | Payer: BC Managed Care – PPO | Attending: Neuroradiology | Admitting: Neuroradiology

## 2023-06-22 DIAGNOSIS — Q283 Other malformations of cerebral vessels: Secondary | ICD-10-CM | POA: Insufficient documentation

## 2023-06-22 DIAGNOSIS — R9082 White matter disease, unspecified: Secondary | ICD-10-CM | POA: Insufficient documentation

## 2023-06-24 NOTE — Progress Notes (Signed)
[facility] Note    Patient: Keith Cross  Patient DOB: 1961/10/10  Patient ID: [id number]  Patient PCP: Suzan Slick  Date of Service: 07/08/2023    Reason for Visit  No chief complaint on file.       HPI  Keith Cross is a 62 year old male    Interval History  Patients last seen 04/23/23    Interpeter: Mgoc 22007    Still having some fatigue with exertion.      Blood pressure has been averaging 150-170/90-110.  With rare hypotensive values    Pending shoulder surgery with Dr. Francis Dowse    Problem List  Patient Active Problem List    Diagnosis Date Noted    Papillary thyroid carcinoma (CMS-HCC) 07/25/2022    Cerebral cavernoma 07/04/2021    OSA (obstructive sleep apnea) 04/17/2021    Chest pain 03/27/2021    Abnormal stress echo 03/27/2021        PMH  Past Medical History:   Diagnosis Date    History of CVA (cerebrovascular accident)     Hyperlipidemia     Hypertension     Type 2 diabetes mellitus (CMS-HCC)        Meds  Current Outpatient Medications   Medication Sig    acetaminophen (TYLENOL) 325 MG tablet Take 2 tablets (650 mg) by mouth.    albuterol 108 (90 Base) MCG/ACT inhaler TAKE 2 PUFFS EVERY 4 HOURS    allopurinol (ZYLOPRIM) 300 MG tablet Take 1 tablet (300 mg) by mouth daily.    amLODIPINE (NORVASC) 10 MG tablet Take 1 tablet (10 mg) by mouth daily.    aspirin 81 MG EC tablet Take 1 tablet (81 mg) by mouth daily.    atorvastatin (LIPITOR) 80 MG tablet Take 1 tablet (80 mg) by mouth daily.    Blood Glucose Monitoring Suppl (TRUE METRIX METER) w/Device KIT As Directed.    carvedilol (COREG) 25 MG tablet Take 1 tablet (25 mg) by mouth 2 times daily (with meals).    colchicine 0.6 MG tablet TAKE 2 TABLETS NOW AND TAKE 1 TABLET 1 HOUR LATER FOR 1 DAY THEN TWICE A DAY    empagliflozin (JARDIANCE) 25 mg tablet Take 1 tablet (25 mg) by mouth daily.    Evolocumab (REPATHA) 140 MG/ML injection Inject 140 mg under the skin every 14 days.    ezetimibe (ZETIA) 10 MG tablet Take 1 tablet (10 mg) by mouth  daily.    furosemide (LASIX) 40 MG tablet TAKE 1 TABLET BY MOUTH EVERY DAY IN THE MORNING    galcanezumab-gnlm (EMGALITY) 120 mg/mL injection pen Inject 1 mL (120 mg) under the skin every 30 days.    hydrALAZINE (APRESOLINE) 25 MG tablet Take 1 tablet (25 mg) by mouth as needed (Take if SBP>160, up to twice daily).    hydroCHLOROthiazide (HYDRODIURIL) 25 MG tablet Take 1 tablet (25 mg) by mouth daily.    isosorbide mononitrate (IMDUR) 60 MG Controlled-Release tablet     losartan (COZAAR) 25 MG tablet Take 3 tablets (75 mg) by mouth daily.    meclizine (ANTIVERT) 25 MG tablet     metFORMIN (GLUCOPHAGE) 1000 mg tablet Take 1 tablet (1,000 mg) by mouth 2 times daily.    nitroGLYcerin (NITRODUR) 0.1 MG/HR patch Apply 1 patch topically daily.    ondansetron (ZOFRAN ODT) 4 MG disintegrating tablet TAKE 1 TABLET BY MOUTH EVERY 6 HOURS AS NEEDED FOR NAUSEA OR VOMITING.    pantoprazole (PROTONIX) 40 MG tablet Take 1  tablet (40 mg) by mouth daily.    pioglitazone (ACTOS) 30 mg tablet Take 1 tablet (30 mg) by mouth.    potassium chloride (KLOR-CON M20) 20 MEQ tablet TAKE 1 TABLET DAILY IN THE MORNING    rimegepant (NURTEC ODT) tablet Dissolve 1 tablet (75 mg) on or under the tongue daily as needed (to treat migraines). Do not take more than one tablet per day.    Tirzepatide [facility]) 10 MG/0.5ML injection pen Inject 0.5 mL (10 mg) under the skin every 7 days. Inject 10 mg once a week for 4 weeks, then increase to 12.5 mg weekly    Tirzepatide (MOUNJARO) 12.5 MG/0.5ML injection pen Inject 0.5 mL (12.5 mg) under the skin every 7 days. Inject 12.5 mg once a week for 4 weeks, then increase to 15 mg weekly    Tirzepatide (MOUNJARO) 15 MG/0.5ML injection pen Inject 0.5 mL (15 mg) under the skin every 7 days. After 4 weeks of the 12.5 mg/week dose, increase to 15 mg weekly. This is the maintenance dose.    Tirzepatide [facility]) 2.5 MG/0.5ML injection pen Inject 0.5 mL (2.5 mg) under the skin every 7 days. for 28 days. Then start  the 5 mg into the skin every 7 days.    Tirzepatide [facility]) 5 MG/0.5ML injection pen Inject 0.5 mL (5 mg) under the skin every 7 days. Inject 5 mg every week for 4 weeks, then increase to 7.5 mg weekly    Tirzepatide (MOUNJARO) 7.5 MG/0.5ML injection pen Inject 0.5 mL (7.5 mg) under the skin every 7 days. Inject 7.5 mg once a week for 4 weeks, then increase to 10 mg weekly    topiramate (TOPAMAX) 50 MG tablet Take 1 tablet (50 mg) by mouth 2 times daily.    TRUE METRIX BLOOD GLUCOSE TEST test strip CHECK BLOOD SUGAR ONCE A DAY    TRUEplus Lancets 30G MISC CHECK BLOOD SUGAR ONCE A DAY    venlafaxine XR (EFFEXOR XR) 75 MG capsule Take 1 capsule (75 mg) by mouth daily.     Current Facility-Administered Medications   Medication    nitroGLYcerin (NITROLINGUAL) spray 1 spray       All  No Known Allergies    ROS  A comprehensive review of 12 organ systems was performed and reviewed with the patient. Review of systems with the patient was negative except as indicated in the HPI above.    SH  Social History     Socioeconomic History    Marital status: Married     Spouse name: Not on file    Number of children: Not on file    Years of education: Not on file    Highest education level: Not on file   Occupational History    Not on file   Tobacco Use    Smoking status: Never    Smokeless tobacco: Never   Substance and Sexual Activity    Alcohol use: Not Currently    Drug use: Not on file    Sexual activity: Not on file   Other Topics Concern    Not on file   Social History Narrative    Not on file     Social Determinants of Health     Financial Resource Strain: Low Risk  (05/21/2022)    Received from [facility] [place]    Overall [employer] (CARDIA)     Difficulty of Paying Living Expenses: Not very hard   Food Insecurity: Not on file  Transportation Needs: Not on file   Physical Activity: Not on file   Stress: Not on file   Social Connections: Not on file   Intimate Partner  Violence: Not on file   Housing Stability: Not At Risk (06/14/2022)    Received from [facility]     Housing Stability: Not on file     Housing Stability: Not on file     FH  No family history on file.    Past Surgical History:  No date: Left knee surgery     Physical Exam  There were no vitals filed for this visit.    Gen: pleasant, in NAD  Neuro: A&Ox3, no focal deficit  HEENT:  NC/AT. MMM  Neck: no bruit. JVD   CV: regular rate, normal rhythm, S1 S2   Resp: clear, no rales  Abd:  NABS soft ntnd  Ext: no c/c/e  Vasc: 2+ radial, DP pulses. Cap refill < 3 sec. Periphery warm    Labs  Lab Results   Component Value Date    RBC 5.07 07/29/2022    HGB 14.4 07/29/2022    HCT 44.9 07/29/2022    MCV 88.6 07/29/2022    MCHC 32.0 07/29/2022    RDW 15.2 (H) 07/29/2022    PLT 271 07/29/2022     Lab Results   Component Value Date    BUN 12 04/01/2023    CREAT 1.1 04/01/2023    CL 103 04/01/2023    K 3.5 04/01/2023    Bradley 9.0 04/01/2023    TBILI 0.8 04/01/2023    ALB 4.3 04/01/2023    AST 32 04/01/2023    ALK 59 04/01/2023    ALT 45 04/01/2023    GLU 127 (H) 04/01/2023     Lab Results   Component Value Date    INR 0.94 07/29/2022    PTT 28.5 07/29/2022       Lab Results   Component Value Date    BNP 21 03/27/2021     Lab Results   Component Value Date    A1C 7.5 (H) 04/01/2023     Lab Results   Component Value Date    CHOL 167 04/01/2023    LDLCALC 99 04/01/2023    TRIG 76 04/01/2023       Other Diagnostics  Coronary angiogram:  LHC on 03/27/2021:  CONCLUSIONS:   1. Mid LAD with 50% stenosis. RFR of mid LAD was 0.95. This was physiologically non-significant.   2. LVEDP 20-25 mm Hg.     RECOMMENDATIONS:   1. Maximize medical therapy.   2. Aggressive risk factor modification efforts.   3. Monitor vitals and arterial access site/pulses.    Echocardiogram 03/27/21:   1. Mildly decreased left ventricular systolic function. The left ventricular ejection fraction is 51% by Biplane.   2. Left ventricular ejection  fraction is 53% by 3D.   3. Mild concentric left ventricular hypertrophy.   4. Normal LV filling pressures.   5. Normal right ventricular size and systolic function.    Exercise stress echocardiogram 03/27/21:   1. Positive exercise stress test for ischemia.   2. Patient developed chest pain at peak excercise that improved with recovery and rest.   3. Stage 1: 1: Mid and apical inferior wall is abnormal.      Stage 2: 2: Apical inferior segment is abnormal.   4. The peak estimated pulmonary artery systolic pressure during exercise was indeterminate.   5. Average functional  capacity.    Assessment and plan:   Keith Cross is a 62 year old male      #Hypertension  -Suspect significant improvement due to initiating CPAP for sleep apnea  -Continue carvedilol 25 mg BID, HCTZ 25 mg daily nitroglycerin patch 0.1 mg/hr for now.    -Amlodipine 10 mg daily, losartan 50 mg daily  -Increase losartan to 75 mg daily  -Prior secondary hypertension work-up negative for hyperaldosteronism and pheochromocytoma.  Normal creatinine.  His left arm blood pressure was actually higher than his right arm blood pressure which does not suggest coarctation of the aorta.    -Bilateral renal artery stenosis unlikely with normal creatinine on ARB, no hemodynamically significant renal artery stenosis on duplex US  -Following with sleep medicine for treatment of severe OSA    #Coronary artery disease  -Moderate non obstructive disease found on coronary angiogram  -Aspirin 81 mg daily, carvedilol 25 mg BID, atorvastatin 80 mg daily, amlodipine 10 mg daily, nitroglycerin 0.1 mg/hr patch  -He had abnormal exercise stress echocardiogram and was referred for coronary angiogram as above which showed non obstructive disease  -Consider microvascular disease?  Control blood pressure as above.  Nitroglycerin 0.1 mg/hr patch.    -Will refer to cardiac rehab to improve exertional capacity    #Hyperlipidemia  -Goal LDL<70 mg/dL given CAD  -FLP 11/15/08: tc 167, hdl  47, ldL 93, TG 137  -FLP 06/19/21: TC 97, HDL 44, LDL 33, TG 98  -FLP 05/15/22: TC 170, HDL 60, LDL 92, TG 91  -FLP 07/28/22: TC 97, HDL 41, LDL 37, TG 46  -FLP 04/01/23: Cholesterol 167 (05/22) HDL 53 (05/22) LDL 99 (05/22) Triglycerides 76 (05/22)  -FLP 06/26/23: Cholesterol 201* (08/16) HDL 54 (08/16) LDL 124 (08/16) Triglycerides 117 (08/16)  -With increase on evocolumab 140 mg subQ q2 weeks.  Recehck FLP to see if delayed effect prior to next visit.  May need to re-add atorvastatin/ezetimibe    #Valve issue?  -Unclear issue with the valve.  TTE shows no significant valvular heart disease.  He has TTE in our system from 2016 showing aortic sclerosis with no other signficant valve disease    #Preprocedural evaluation  -Patient pending shoulder surgery  -Patient's RCRI is 0 placing him at 3.9% risk of MACE in 30 days.  -He has recent coronary angiogram showing no obstructive CAD  -Patient has no evidence of unstable coronary syndrome, decompensated heart failure or uncontrolled arrhythmias.  -He can proceed to planned procedure without further cardiac risk stratification.      RTC in two months        I spent greater than 30 minutes on the day of the encounter reviewing the patient's diagnostic tests, performing a history and physical exam, writing orders, educating the patient, coordinating patient care with the staff, and documenting in the electronic medical records system.             ICD-10-CM ICD-9-CM    1. Benign hypertensive heart disease without heart failure  I11.9 402.10       2. Hyperlipidemia, unspecified hyperlipidemia type  E78.5 272.4       3. Coronary artery disease involving native coronary artery of native heart without angina pectoris  I25.10 414.01       4. Chest pain, unspecified type  R07.9 786.50       5. Aortic valve disorder  I35.9 424.1            There are no Patient Instructions on file for  this visit.    [name], MD  Health Sciences Assistant Clinical Professor  Division of  Cardiology  Department of Medicine  [facility]

## 2023-06-25 ENCOUNTER — Ambulatory Visit: Payer: BC Managed Care – PPO | Admitting: Neurosurgery

## 2023-06-25 ENCOUNTER — Telehealth: Payer: Self-pay

## 2023-06-25 ENCOUNTER — Encounter: Payer: Self-pay | Admitting: Cardiovascular Disease

## 2023-06-25 DIAGNOSIS — E785 Hyperlipidemia, unspecified: Secondary | ICD-10-CM

## 2023-06-25 NOTE — Telephone Encounter (Signed)
-----   Message from [name], MD sent at 06/25/2023 12:27 PM PDT -----  Hi,     Can this patient please have fasting lipids checked prior to the next appointment?    Thanks,  [name]

## 2023-06-25 NOTE — Telephone Encounter (Signed)
Called patient and spoke with daughter Boneta Lucks, reminded to have patient get fasting lipids done prior to follow up 07/08/23. Confirmed appointment date, time and location.    Ross Marcus, LVN

## 2023-06-26 ENCOUNTER — Encounter: Payer: Self-pay | Admitting: Neurosurgery

## 2023-06-26 ENCOUNTER — Other Ambulatory Visit
Admission: RE | Admit: 2023-06-26 | Discharge: 2023-06-26 | Disposition: A | Discharge status: Home Health | Payer: BC Managed Care – PPO | Attending: Cardiovascular Disease

## 2023-06-26 ENCOUNTER — Ambulatory Visit: Payer: BC Managed Care – PPO | Attending: Neurosurgery | Admitting: Neurosurgery

## 2023-06-26 VITALS — BP 178/119 | HR 98 | Temp 97.3°F | Resp 16 | Wt 175.7 lb

## 2023-06-26 DIAGNOSIS — G4489 Other headache syndrome: Secondary | ICD-10-CM | POA: Insufficient documentation

## 2023-06-26 DIAGNOSIS — E785 Hyperlipidemia, unspecified: Secondary | ICD-10-CM | POA: Insufficient documentation

## 2023-06-26 DIAGNOSIS — Q283 Other malformations of cerebral vessels: Secondary | ICD-10-CM | POA: Insufficient documentation

## 2023-06-26 LAB — LIPID(CHOL FRACT) PANEL, BLOOD
Cholesterol: 201 MG/DL — ABNORMAL HIGH (ref <200)
HDL Cholesterol: 54 MG/DL (ref >40)
LDL Cholesterol (calc): 124 MG/DL (ref <160)
Non HDL Cholesterol (calculated): 147 MG/DL — ABNORMAL HIGH (ref <130)
Triglycerides: 117 MG/DL (ref <150)
VLDL Cholesterol (calculated): 23 MG/DL

## 2023-06-26 NOTE — Progress Notes (Signed)
Vara Guardian, MD  [facility]  Department of Neurological Surgery  [address] [place] [address]  [place], [place] [address]   [email or web address]  New Referrals: [phone] Fax: [phone]  Follow Up Apt: [phone]    Follow-Up Visit                                    Date of Evaluation: 06/26/2023    Dear Dr. Seymour Bars, Referred,    I had the pleasure of seeing your patient Keith Cross in neurosurgery clinic today.  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      He has no new symptoms.    I reviewed his new MRI which shows not changes.    I told him that I don't think the lesions are responsible for his HA or numbness.  I would not recommend any intervention or surveillance unless he has new neurological symptoms such as severe HA, seizure, or stroke symptoms.      I would like to refer him to a neurologist for management of HA which has not responded to OTC meds.       Current Medications:  Current Outpatient Medications   Medication Sig Dispense Refill    acetaminophen (TYLENOL) 325 MG tablet Take 2 tablets (650 mg) by mouth.      albuterol 108 (90 Base) MCG/ACT inhaler TAKE 2 PUFFS EVERY 4 HOURS      allopurinol (ZYLOPRIM) 300 MG tablet Take 1 tablet (300 mg) by mouth daily.      amLODIPINE (NORVASC) 10 MG tablet Take 1 tablet (10 mg) by mouth daily. 90 tablet 3    aspirin 81 MG EC tablet Take 1 tablet (81 mg) by mouth daily. 90 tablet 3    atorvastatin (LIPITOR) 80 MG tablet Take 1 tablet (80 mg) by mouth daily. 90 tablet 3    Blood Glucose Monitoring Suppl (TRUE METRIX METER) w/Device KIT As Directed.      carvedilol (COREG) 25 MG tablet Take 1 tablet (25 mg) by mouth 2 times daily (with meals). 180 tablet 3    colchicine 0.6 MG tablet TAKE 2 TABLETS NOW AND TAKE 1 TABLET 1 HOUR LATER FOR 1 DAY THEN TWICE A DAY      empagliflozin  (JARDIANCE) 25 mg tablet Take 1 tablet (25 mg) by mouth daily. 90 tablet 3    Evolocumab (REPATHA) 140 MG/ML injection Inject 140 mg under the skin every 14 days. 2 mL 3    ezetimibe (ZETIA) 10 MG tablet Take 1 tablet (10 mg) by mouth daily. 90 tablet 3    furosemide (LASIX) 40 MG tablet TAKE 1 TABLET BY MOUTH EVERY DAY IN THE MORNING      galcanezumab-gnlm (EMGALITY) 120 mg/mL injection pen Inject 1 mL (120 mg) under the skin every 30 days. 1 mL 11    hydrALAZINE (APRESOLINE) 25 MG tablet Take 1 tablet (25 mg) by mouth as needed (Take if SBP>160, up to twice daily). 90 tablet 3    hydroCHLOROthiazide (HYDRODIURIL) 25 MG tablet Take 1 tablet (25 mg) by mouth daily.      isosorbide mononitrate (IMDUR) 60 MG Controlled-Release tablet       losartan (COZAAR) 25 MG tablet Take 3 tablets (75 mg) by  mouth daily. 270 tablet 3    meclizine (ANTIVERT) 25 MG tablet       metFORMIN (GLUCOPHAGE) 1000 mg tablet Take 1 tablet (1,000 mg) by mouth 2 times daily.      nitroGLYcerin (NITRODUR) 0.1 MG/HR patch Apply 1 patch topically daily. 30 patch 0    ondansetron (ZOFRAN ODT) 4 MG disintegrating tablet TAKE 1 TABLET BY MOUTH EVERY 6 HOURS AS NEEDED FOR NAUSEA OR VOMITING.      pantoprazole (PROTONIX) 40 MG tablet Take 1 tablet (40 mg) by mouth daily.      pioglitazone (ACTOS) 30 mg tablet Take 1 tablet (30 mg) by mouth.      potassium chloride (KLOR-CON M20) 20 MEQ tablet TAKE 1 TABLET DAILY IN THE MORNING      rimegepant (NURTEC ODT) tablet Dissolve 1 tablet (75 mg) on or under the tongue daily as needed (to treat migraines). Do not take more than one tablet per day. 16 tablet 11    Tirzepatide (MOUNJARO) 10 MG/0.5ML injection pen Inject 0.5 mL (10 mg) under the skin every 7 days. Inject 10 mg once a week for 4 weeks, then increase to 12.5 mg weekly 2 mL 0    Tirzepatide (MOUNJARO) 12.5 MG/0.5ML injection pen Inject 0.5 mL (12.5 mg) under the skin every 7 days. Inject 12.5 mg once a week for 4 weeks, then increase to 15 mg weekly  2 mL 0    Tirzepatide (MOUNJARO) 15 MG/0.5ML injection pen Inject 0.5 mL (15 mg) under the skin every 7 days. After 4 weeks of the 12.5 mg/week dose, increase to 15 mg weekly. This is the maintenance dose. 2 mL 11    Tirzepatide (MOUNJARO) 2.5 MG/0.5ML injection pen Inject 0.5 mL (2.5 mg) under the skin every 7 days. for 28 days. Then start the 5 mg into the skin every 7 days. 2 mL 0    Tirzepatide (MOUNJARO) 5 MG/0.5ML injection pen Inject 0.5 mL (5 mg) under the skin every 7 days. Inject 5 mg every week for 4 weeks, then increase to 7.5 mg weekly 2 mL 0    Tirzepatide (MOUNJARO) 7.5 MG/0.5ML injection pen Inject 0.5 mL (7.5 mg) under the skin every 7 days. Inject 7.5 mg once a week for 4 weeks, then increase to 10 mg weekly 2 mL 0    topiramate (TOPAMAX) 50 MG tablet Take 1 tablet (50 mg) by mouth 2 times daily.      TRUE METRIX BLOOD GLUCOSE TEST test strip CHECK BLOOD SUGAR ONCE A DAY      TRUEplus Lancets 30G MISC CHECK BLOOD SUGAR ONCE A DAY      venlafaxine XR (EFFEXOR XR) 75 MG capsule Take 1 capsule (75 mg) by mouth daily. 90 capsule 1     Current Facility-Administered Medications   Medication Dose Route Frequency Provider Last Rate Last Admin    nitroGLYcerin (NITROLINGUAL) spray 1 spray  1 spray Sublingual Q5 Min PRN [name], MD   3 spray at 03/27/21 1439             I spent 20 min/50% of visit in consultation regarding the patient's symptoms, imaging and management.    Vara Guardian, MD  Department of Neurological Surgery

## 2023-07-08 ENCOUNTER — Ambulatory Visit: Payer: BC Managed Care – PPO | Attending: Cardiovascular Disease | Admitting: Cardiovascular Disease

## 2023-07-08 VITALS — BP 155/104 | HR 81 | Temp 98.1°F | Wt 176.0 lb

## 2023-07-08 DIAGNOSIS — R079 Chest pain, unspecified: Secondary | ICD-10-CM | POA: Insufficient documentation

## 2023-07-08 DIAGNOSIS — E785 Hyperlipidemia, unspecified: Secondary | ICD-10-CM | POA: Insufficient documentation

## 2023-07-08 DIAGNOSIS — Z6827 Body mass index (BMI) 27.0-27.9, adult: Secondary | ICD-10-CM | POA: Insufficient documentation

## 2023-07-08 DIAGNOSIS — I119 Hypertensive heart disease without heart failure: Secondary | ICD-10-CM | POA: Insufficient documentation

## 2023-07-08 DIAGNOSIS — I2510 Atherosclerotic heart disease of native coronary artery without angina pectoris: Secondary | ICD-10-CM | POA: Insufficient documentation

## 2023-07-08 DIAGNOSIS — I359 Nonrheumatic aortic valve disorder, unspecified: Secondary | ICD-10-CM | POA: Insufficient documentation

## 2023-07-08 MED ORDER — NITROGLYCERIN 0.4 MG SL SUBL
SUBLINGUAL_TABLET | SUBLINGUAL | Status: AC
Start: 2023-06-16 — End: ?

## 2023-07-08 MED ORDER — LOSARTAN POTASSIUM 100 MG OR TABS
100.0000 mg | ORAL_TABLET | Freq: Every day | ORAL | 3 refills | Status: AC
Start: 2023-07-08 — End: ?

## 2023-07-08 NOTE — Patient Instructions (Signed)
Please call cardiac rehab at [phone] to make an appointment

## 2023-08-11 ENCOUNTER — Telehealth: Payer: Self-pay

## 2023-08-11 NOTE — Telephone Encounter (Signed)
Called the patient via [place] interpreter # [phone] and left the VM regarding of informing that patient needs to schedule [place] H/N prior to next appointment with Dr.Mao on 08/28/2023. Provided call back number [phone] for any questions.    Eliora Nienhuis(Judy), RN ( Nurse Navigator for Dr. Baxter Flattery)

## 2023-08-18 ENCOUNTER — Telehealth: Payer: Self-pay

## 2023-08-18 NOTE — Telephone Encounter (Signed)
Called the patient's daughter and left the VM regarding of informing that patient needs to schedule [place] H/N prior to next appointment with Dr.Mao on 08/28/2023. Provided call back number [phone] for any questions.   Joni Colegrove(Judy), RN ( Nurse Navigator for Dr. Baxter Flattery)

## 2023-08-19 ENCOUNTER — Ambulatory Visit: Payer: BC Managed Care – PPO | Admitting: Student in an Organized Health Care Education/Training Program

## 2023-08-20 ENCOUNTER — Ambulatory Visit
Payer: BC Managed Care – PPO | Attending: Student in an Organized Health Care Education/Training Program | Admitting: Student in an Organized Health Care Education/Training Program

## 2023-08-20 ENCOUNTER — Ambulatory Visit
Admit: 2023-08-20 | Discharge: 2023-08-20 | Disposition: A | Discharge status: Home Health | Payer: BC Managed Care – PPO

## 2023-08-20 ENCOUNTER — Ambulatory Visit
Admission: RE | Admit: 2023-08-20 | Discharge: 2023-08-20 | Disposition: A | Discharge status: Home / Self Care | Payer: BC Managed Care – PPO | Attending: Surgery | Admitting: Surgery

## 2023-08-20 VITALS — BP 182/123 | HR 86 | Ht 67.01 in | Wt 174.7 lb

## 2023-08-20 DIAGNOSIS — Z6827 Body mass index (BMI) 27.0-27.9, adult: Secondary | ICD-10-CM | POA: Insufficient documentation

## 2023-08-20 DIAGNOSIS — C73 Malignant neoplasm of thyroid gland: Secondary | ICD-10-CM | POA: Insufficient documentation

## 2023-08-20 DIAGNOSIS — G629 Polyneuropathy, unspecified: Secondary | ICD-10-CM | POA: Insufficient documentation

## 2023-08-20 DIAGNOSIS — Z85850 Personal history of malignant neoplasm of thyroid: Secondary | ICD-10-CM

## 2023-08-20 DIAGNOSIS — E890 Postprocedural hypothyroidism: Secondary | ICD-10-CM

## 2023-08-20 DIAGNOSIS — E119 Type 2 diabetes mellitus without complications: Secondary | ICD-10-CM | POA: Insufficient documentation

## 2023-08-20 LAB — HEMOGLOBIN A1C, POINT OF CARE TESTING: Hemoglobin A1c, POC: 8 % — ABNORMAL HIGH (ref 4.0–5.6)

## 2023-08-20 LAB — COMPREHENSIVE METABOLIC PANEL, BLOOD
ALT: 50 U/L (ref 7–52)
AST: 25 U/L (ref 13–39)
Albumin: 4.3 g/dL (ref 4.2–5.5)
Alk Phos: 52 U/L (ref 34–104)
BUN: 14 mg/dL (ref 7–25)
Bilirubin, Total: 0.6 mg/dL (ref 0.0–1.4)
CO2: 26 mmol/L (ref 21–31)
Calcium: 9 mg/dL (ref 8.6–10.3)
Chloride: 103 mmol/L (ref 98–107)
Creat: 1.2 mg/dL (ref 0.7–1.3)
Electrolyte Balance: 9 mmol/L (ref 2–12)
Glucose: 122 mg/dL (ref 85–125)
Potassium: 4 mmol/L (ref 3.5–5.1)
Protein, Total: 7.2 g/dL (ref 6.0–8.3)
Sodium: 138 mmol/L (ref 136–145)
eGFR - high estimate: >60 (ref >59)
eGFR - low estimate: >60 (ref >59)

## 2023-08-20 LAB — LIPID(CHOL FRACT) PANEL, BLOOD
Cholesterol: 194 mg/dL (ref <200)
HDL Cholesterol: 51 mg/dL (ref >40)
LDL Cholesterol (calc): 119 mg/dL (ref <160)
Non HDL Cholesterol (calculated): 143 mg/dL — ABNORMAL HIGH (ref <130)
Triglycerides: 119 mg/dL (ref <150)
VLDL Cholesterol (calculated): 24 mg/dL

## 2023-08-20 LAB — RANDOM URINE MICROALB/CREAT RATIO PANEL
Creatinine, G/L: 0.9 g/L
Microalbumin/Creatinine Ratio: 105.6 MG MICAL/GM CRE
Microalbumin: 95 mg/L

## 2023-08-20 LAB — TSH, BLOOD: TSH, Ultrasensitive: 3.098 u[IU]/mL (ref 0.450–4.120)

## 2023-08-20 LAB — FREE THYROXINE, BLOOD: Free T4: 0.97 ng/dL (ref 0.60–1.12)

## 2023-08-20 MED ORDER — GLUCOSE BLOOD VI STRP
1.0000 | ORAL_STRIP | Freq: Every day | 11 refills | Status: AC
Start: 2023-08-20 — End: ?

## 2023-08-20 MED ORDER — INSULIN DEGLUDEC 100 UNIT/ML SC SOPN
10.0000 [IU] | PEN_INJECTOR | Freq: Every evening | SUBCUTANEOUS | 3 refills | Status: AC
Start: 2023-08-20 — End: ?

## 2023-08-20 MED ORDER — FREESTYLE LIBRE 3 SENSOR MISC
3 refills | Status: AC
Start: 2023-08-20 — End: ?

## 2023-08-20 MED ORDER — LANCETS MISC
1.0000 | Freq: Every day | 11 refills | Status: AC
Start: 2023-08-20 — End: ?

## 2023-08-20 MED ORDER — BLOOD GLUCOSE METER KIT
PACK | 0 refills | Status: AC
Start: 2023-08-20 — End: ?

## 2023-08-20 NOTE — Progress Notes (Signed)
ENDOCRINOLOGY OUTPATIENT VISIT - [facility]    Date and Time of Evaluation:  Thursday August 20, 2023    Primary Care Physician:  Hoover Brunette [facility]     Chief Complaint:   No chief complaint on file.      History of Present Illness:    [name] is a 62 year old male with CVA 2015, HTN, HLD, and T2DM, here for evaluation and management of adrenal nodule.    Subcentimeter adrenal nodule (0.9 cm) on 10/2020 CTAP with 28 HU and absolute/relative contrast washout of 74%/42%, c/w benign adrenal adenoma.     Was seen by Dr. Billy Fischer of Endocrine Surgery given history of uncontrolled hypertension and previous hormonal w/up with elevated plasma metanephrines 62 (< 57) and normetanephrines 252 (< 148), though less than 2x ULN. Aldosterone level was normal at 8. Repeat labs with Dr. Baxter Flattery with unremarkable plasma metanephrines (normetanephrines very minimally elevated at 0.98, normal < 0.9). 24-hour urine metanephrines were wnl. Aldosterone/renin wnl with normal dexamethasone suppression test.      Currently on multiple hypertensive agents, including clonidine, carvedilol, hydralazine, isosorbide mononitrate, amlodipine, losartan, metoprolol, and furosemide. BP 181/108 in clinic today.     Patient does endorse headaches in the morning and late evening. He does not have a Neurologist. Also endorses episodes of palpitations, diaphoresis, SOB, and chest pain. Advised patient he needs to present to ER if he has these symptoms. Has Cardiology referral. Reports diarrhea with mucus in stool once every few weeks.     08/20/2023 follow up:    Tolerating Mounjaro 15 mg weekly. Has decreased rice intake to 1 bowl daily.     Has repeat thyroid US scheduled.     BPs remain uncontrolled despite multiple antihypertensives; following with cardiology    Past Medical/Surgical History:  Past Medical History:   Diagnosis Date    Angina pectoris (CMS-HCC)     Dizziness     Headache 12/2018    History of CVA (cerebrovascular accident)      Hyperlipidemia     Hypertension     Sleep apnea 2014    Stroke (CMS-HCC)     TIA    Type 2 diabetes mellitus (CMS-HCC)     Visual impairment        Family History:  No family history on file.    Social History:  Social History     Socioeconomic History    Marital status: Married   Tobacco Use    Smoking status: Never    Smokeless tobacco: Never   Substance and Sexual Activity    Alcohol use: Not Currently     Social Determinants of Health     Financial Resource Strain: Low Risk  (05/21/2022)    Received from [facility] [place]    Overall Physicist, medical Strain (CARDIA)     Difficulty of Paying Living Expenses: Not very hard    Received from [facility]       Allergies:  No Known Allergies    Medications:  Current Outpatient Medications on File Prior to Visit   Medication Sig Dispense Refill    acetaminophen (TYLENOL) 325 MG tablet Take 2 tablets (650 mg) by mouth.      albuterol 108 (90 Base) MCG/ACT inhaler TAKE 2 PUFFS EVERY 4 HOURS      allopurinol (ZYLOPRIM) 300 MG tablet Take 1 tablet (300 mg) by mouth daily.      amLODIPINE (NORVASC) 10  MG tablet Take 1 tablet (10 mg) by mouth daily. 90 tablet 3    aspirin 81 MG EC tablet Take 1 tablet (81 mg) by mouth daily. 90 tablet 3    atorvastatin (LIPITOR) 80 MG tablet Take 1 tablet (80 mg) by mouth daily. 90 tablet 3    Blood Glucose Monitoring Suppl (TRUE METRIX METER) w/Device KIT As Directed.      carvedilol (COREG) 25 MG tablet Take 1 tablet (25 mg) by mouth 2 times daily (with meals). 180 tablet 3    colchicine 0.6 MG tablet TAKE 2 TABLETS NOW AND TAKE 1 TABLET 1 HOUR LATER FOR 1 DAY THEN TWICE A DAY      empagliflozin (JARDIANCE) 25 mg tablet Take 1 tablet (25 mg) by mouth daily. 90 tablet 3    Evolocumab (REPATHA) 140 MG/ML injection Inject 140 mg under the skin every 14 days. 2 mL 3    ezetimibe (ZETIA) 10 MG tablet Take 1 tablet (10 mg) by mouth daily. 90 tablet 3    furosemide (LASIX) 40 MG tablet TAKE  1 TABLET BY MOUTH EVERY DAY IN THE MORNING      galcanezumab-gnlm (EMGALITY) 120 mg/mL injection pen Inject 1 mL (120 mg) under the skin every 30 days. 1 mL 11    hydrALAZINE (APRESOLINE) 25 MG tablet Take 1 tablet (25 mg) by mouth as needed (Take if SBP>160, up to twice daily). 90 tablet 3    hydroCHLOROthiazide (HYDRODIURIL) 25 MG tablet Take 1 tablet (25 mg) by mouth daily.      isosorbide mononitrate (IMDUR) 60 MG Controlled-Release tablet       losartan (COZAAR) 100 MG tablet Take 1 tablet (100 mg) by mouth daily. 90 tablet 3    meclizine (ANTIVERT) 25 MG tablet       metFORMIN (GLUCOPHAGE) 1000 mg tablet Take 1 tablet (1,000 mg) by mouth 2 times daily.      nitroGLYcerin (NITRODUR) 0.1 MG/HR patch Apply 1 patch topically daily. 30 patch 0    nitroGLYcerin (NITROSTAT) 0.4 MG SL tablet       ondansetron (ZOFRAN ODT) 4 MG disintegrating tablet TAKE 1 TABLET BY MOUTH EVERY 6 HOURS AS NEEDED FOR NAUSEA OR VOMITING.      pantoprazole (PROTONIX) 40 MG tablet Take 1 tablet (40 mg) by mouth daily.      pioglitazone (ACTOS) 30 mg tablet Take 1 tablet (30 mg) by mouth.      potassium chloride (KLOR-CON M20) 20 MEQ tablet TAKE 1 TABLET DAILY IN THE MORNING      rimegepant (NURTEC ODT) tablet Dissolve 1 tablet (75 mg) on or under the tongue daily as needed (to treat migraines). Do not take more than one tablet per day. 16 tablet 11    Tirzepatide (MOUNJARO) 10 MG/0.5ML injection pen Inject 0.5 mL (10 mg) under the skin every 7 days. Inject 10 mg once a week for 4 weeks, then increase to 12.5 mg weekly 2 mL 0    Tirzepatide (MOUNJARO) 12.5 MG/0.5ML injection pen Inject 0.5 mL (12.5 mg) under the skin every 7 days. Inject 12.5 mg once a week for 4 weeks, then increase to 15 mg weekly 2 mL 0    Tirzepatide (MOUNJARO) 15 MG/0.5ML injection pen Inject 0.5 mL (15 mg) under the skin every 7 days. After 4 weeks of the 12.5 mg/week dose, increase to 15 mg weekly. This is the maintenance dose. 2 mL 11    Tirzepatide (MOUNJARO) 2.5  MG/0.5ML injection pen Inject 0.5 mL (  2.5 mg) under the skin every 7 days. for 28 days. Then start the 5 mg into the skin every 7 days. 2 mL 0    Tirzepatide (MOUNJARO) 5 MG/0.5ML injection pen Inject 0.5 mL (5 mg) under the skin every 7 days. Inject 5 mg every week for 4 weeks, then increase to 7.5 mg weekly 2 mL 0    Tirzepatide (MOUNJARO) 7.5 MG/0.5ML injection pen Inject 0.5 mL (7.5 mg) under the skin every 7 days. Inject 7.5 mg once a week for 4 weeks, then increase to 10 mg weekly 2 mL 0    topiramate (TOPAMAX) 50 MG tablet Take 1 tablet (50 mg) by mouth 2 times daily.      TRUE METRIX BLOOD GLUCOSE TEST test strip CHECK BLOOD SUGAR ONCE A DAY      TRUEplus Lancets 30G MISC CHECK BLOOD SUGAR ONCE A DAY      venlafaxine XR (EFFEXOR XR) 75 MG capsule Take 1 capsule (75 mg) by mouth daily. 90 capsule 1     Current Facility-Administered Medications on File Prior to Visit   Medication Dose Route Frequency Provider Last Rate Last Admin    nitroGLYcerin (NITROLINGUAL) spray 1 spray  1 spray Sublingual Q5 Min PRN [name], MD   3 spray at 03/27/21 1439       Physical Exam:  Vital Signs: BP (!) 182/123   Pulse 86   Ht 5' 7.01" (1.702 m)   Wt 79.2 kg (174 lb 11.1 oz)   BMI 27.35 kg/m     General:  No acute distress  HEENT: No proptosis, lid lag, or periorbital edema. No scleral icterus   Lungs: Symmetric chest rise, normal work of breathing  Neurological: Alerted and oriented x 4  Skin:  No visible lesions  Psych: Appropriate mood and affect    Diagnostic Data:    Laboratory Data:    Lab Results   Component Value Date    HGB 14.4 07/29/2022    HCT 44.9 07/29/2022    PLT 271 07/29/2022    RBC 5.07 07/29/2022    MCV 88.6 07/29/2022    RDW 15.2 (H) 07/29/2022    NEUTP 56.2 07/29/2022    NEUTAB 3.5 07/29/2022    EOSAB 0.3 07/29/2022       Lab Results   Component Value Date/Time    SODIUM 137 04/01/2023 03:44 PM    K 3.5 04/01/2023 03:44 PM    CL 103 04/01/2023 03:44 PM    CO2 24 04/01/2023 03:44 PM    BUN 12  04/01/2023 03:44 PM    CREAT 1.1 04/01/2023 03:44 PM    GLU 127 (H) 04/01/2023 03:44 PM    [place] 9.0 04/01/2023 03:44 PM    MG 2.0 03/28/2021 08:28 AM    PHOS 3.9 03/28/2021 08:28 AM    TPROT 7.4 04/01/2023 03:44 PM    ALB 4.3 04/01/2023 03:44 PM    ALK 59 04/01/2023 03:44 PM    TBILI 0.8 04/01/2023 03:44 PM    AST 32 04/01/2023 03:44 PM    ALT 45 04/01/2023 03:44 PM       Lab Results   Component Value Date/Time    A1C 8.0 (H) 08/20/2023 01:58 PM    A1C 7.5 (H) 04/01/2023 03:44 PM       Lab Results   Component Value Date/Time    TSHHS 2.501 04/01/2023 03:44 PM    FREET4 0.94 04/01/2023 03:44 PM     02/2023 Thyroid US    IMPRESSION:  1.  Right thyroid lobe is surgically absent without abnormal findings in the surgical bed. The left thyroid lobe is unremarkable in appearance.     END     06/18/2022 Thyroid FNA        07/2022 Right Lobectomy    A.         RIGHT THYROID LOBE, LOBECTOMY:  -           Papillary thyroid carcinoma of the right lobe (1 cm), limited to the thyroid.  -           No obvious lymphovascular invasion seen (see CAP Cancer Protocol Summary below).  -           Surrounding multinodular hyperplastic parenchyma.      Electronically signed by Maris Berger, MD for Saralyn Pilar, MD on 08/08/2022 at 1256   Comment    Prior fine needle aspiration findings ([id number]) noted.  Multiple deeper levels were examined.      Synoptic Checklist   THYROID GLAND   8th Edition - Protocol posted: 3/22/2023THYROID GLAND: RESECTION - All Specimens  SPECIMEN   Procedure  Right lobectomy   TUMOR   Tumor Focality  Unifocal   Tumor Characteristics     Tumor Site  Right lobe   Tumor Size  Greatest Dimension (Centimeters): 1 cm   Additional Dimension (Centimeters)  0.5 cm     0.3 cm   Histologic Tumor Types and Subtypes  Papillary carcinoma, classic subtype   Tumor Proliferative Activity     Ki-67 Labeling Index  >3 %   Methodology  Manual count   Tumor Necrosis  Not identified   Angioinvasion (vascular invasion)  Not  identified   Lymphatic Invasion  Not identified   Perineural Invasion  Not identified   Extrathyroidal Extension  Not identified   Margin Status  All margins negative for carcinoma   Distance from Invasive Carcinoma to Closest Margin  At least: 1 mm   Margin Comment  Right posterior (Block: A1)   REGIONAL LYMPH NODES   Regional Lymph Node Status  Not applicable (no regional lymph nodes submitted or found)   pTNM CLASSIFICATION (AJCC 8th Edition)   Reporting of pT, pN, and (when applicable) pM categories is based on information available to the pathologist at the time the report is issued. As per the AJCC (Chapter 1, 8th Ed.) it is the managing physician's responsibility to establish the final pathologic stage based upon all pertinent information, including but potentially not limited to this pathology report.        pT Category  pT1a   pN Category  pN not assigned (no nodes submitted or found)   Comment(s)  BRAF positive   .      Immunohistochemistry Interpretation    A2        BRAF                Positive              ERG                  Positive in endothelial cells              Ki-67                Slightly elevated: 3~5% of tumor cells         Impression:  Keith Cross is a 62 year old male with CVA 2015, HTN, HLD, and T2DM, here for evaluation  and management of adrenal nodule.    # T2DM   - A1c 8.0% in 08/2023, goal < 8% without hypoglycemia given hx of CAD  - MTF 1g BID  - Jardiance 25 mg daily   - Actos 30 mg  - Mounjaro 15 mg weekly   - Start Tresiba 10 units daily   - Start [employer] 3  - Will obtain fasting labs    Retinopathy: ?Retinopathy. Exam exam: Scheduled  Nephropathy: None. UMA wnl 03/2023. ACEi/ARB: Losartan 100 mg   Neuropathy: +Neuropathy.   CVD: +CVA. LDL 124. Statin: Atorva 80    # Papillary thyroid cancer  - 1.3 cm right TR 5 nodule s/p FNA c/w PTC in 06/2022  - S/p right lobectomy with Dr. Baxter Flattery with 1 cm unifocal classic PTC without concerning features  - No plan for completion at this  time  - Normal TFTS 11/2022  - Stable thyroid US 02/2023; repeat scheduled this week     # Adrenal nodule  - Initially detected on 10/2020 CTAP with 28 HU and absolute/relative contrast washout of 74%/42%, c/w benign adrenal adenoma  - LNSC, 24-hour UFC, renin/aldo, and metanephrines all unremarkable 1-12/2022  - Stable nodule on 12/2022 CT abdomen   - Given stable labs and imaging, will hold of on further monitoring at this time given size < 1 cm     #HTN  - Refractory despite multiple antihypertensives (amlodipine 10 mg daily, coreg 25 mg BID, hctz 25 mg daily, losartan 100 mg daily)   - Negative workup for hyperaldosteronism and pheochromocytoma.   - On CPAP  - Continue f/u with Cardiology     Will obtain labs this week and again before next visit     Future Appointments   Date Time Provider Department Center   08/20/2023  3:00 PM [facility] [place] 1 [facility]   08/28/2023 11:30 AM Runell Gess, MD [place] [facility]   09/02/2023  8:00 AM [name], MD [facility]   01/28/2024  3:00 PM Kaliopi Blyden, Marlana Salvage, MD [facility]     Greggory Stallion, MD

## 2023-08-21 ENCOUNTER — Telehealth: Payer: Self-pay | Admitting: Student in an Organized Health Care Education/Training Program

## 2023-08-21 LAB — GLYCOSYLATED HGB(A1C), BLOOD: Glycated Hgb, A1C: 7.8 % — ABNORMAL HIGH (ref 4.6–5.6)

## 2023-08-21 NOTE — Telephone Encounter (Signed)
PA approved. Approval letter in media.

## 2023-08-26 NOTE — Progress Notes (Signed)
[facility] Note    Patient: Keith Cross  Patient DOB: 1961-02-19  Patient ID: [id number]  Patient PCP: Suzan Slick  Date of Service: 09/02/2023    Reason for Visit  Follow Up       HPI  Keith Cross is a 62 year old male    Interval History  Patients last seen 07/08/23    Interpeter: Cleda Daub [id number]    Still having dizziness, having some chest discomfort    Blood pressures still high, 180/110    Still having some fatigue with exertion.      Brings in bag with pills.  Notably, blood pressure medications are losartan 100 mg daily, jardiance 10 mg daily, hydralazine 25 mg as needed.  He reports these are his morning pills and he takes other pills later    Problem List  Patient Active Problem List    Diagnosis Date Noted    Other headache syndrome 06/26/2023    Papillary thyroid carcinoma (CMS-HCC) 07/25/2022    Cavernous malformation 07/04/2021    OSA (obstructive sleep apnea) 04/17/2021    Chest pain 03/27/2021    Abnormal stress echo 03/27/2021        PMH  Past Medical History:   Diagnosis Date    Angina pectoris (CMS-HCC)     Dizziness     Headache 12/2018    History of CVA (cerebrovascular accident)     Hyperlipidemia     Hypertension     Sleep apnea 2014    Stroke (CMS-HCC)     TIA    Type 2 diabetes mellitus (CMS-HCC)     Visual impairment        Meds   acetaminophen Take 2 tablets (650 mg) by mouth.      albuterol TAKE 2 PUFFS EVERY 4 HOURS      allopurinol Take 1 tablet (300 mg) by mouth daily.      amLODIPINE Take 1 tablet (10 mg) by mouth daily. 90 tablet 3    aspirin Take 1 tablet (81 mg) by mouth daily. 90 tablet 3    atorvastatin Take 1 tablet (80 mg) by mouth daily. 90 tablet 3    blood glucose PHARMACY: Please dispense insurance approved bluetooth enable meter. 1 each 0    carvedilol Take 1 tablet (25 mg) by mouth 2 times daily (with meals). 180 tablet 3    colchicine TAKE 2 TABLETS NOW AND TAKE 1 TABLET 1 HOUR LATER FOR 1 DAY THEN TWICE A DAY      continuous blood glucose Insert  subcutaneously every 14 days. 6 each 3    empagliflozin Take 1 tablet (25 mg) by mouth daily. 90 tablet 3    Repatha Inject 140 mg under the skin every 14 days. 2 mL 3    ezetimibe Take 1 tablet (10 mg) by mouth daily. 90 tablet 3    furosemide TAKE 1 TABLET BY MOUTH EVERY DAY IN THE MORNING      galcanezumab-gnlm Inject 1 mL (120 mg) under the skin every 30 days. 1 mL 11    glucose blood 1 strip by Other route every morning (before breakfast). 100 strip 11    hydrALAZINE Take 1 tablet (25 mg) by mouth as needed (Take if SBP>160, up to twice daily). 90 tablet 3    hydroCHLOROthiazide Take 1 tablet (25 mg) by mouth daily.      insulin degludec Inject 10 Units under the skin at bedtime. 30 mL 3    isosorbide  mononitrate       lancets 1 each by Other route every morning (before breakfast). 100 Lancet. 11    losartan Take 1 tablet (100 mg) by mouth daily. 90 tablet 3    meclizine       metFORMIN Take 1 tablet (1,000 mg) by mouth 2 times daily.      nitroGLYcerin Apply 1 patch topically daily. 30 patch 0    nitroGLYcerin       ondansetron TAKE 1 TABLET BY MOUTH EVERY 6 HOURS AS NEEDED FOR NAUSEA OR VOMITING.      pantoprazole Take 1 tablet (40 mg) by mouth daily.      pioglitazone Take 1 tablet (30 mg) by mouth.      potassium chloride TAKE 1 TABLET DAILY IN THE MORNING      rimegepant Dissolve 1 tablet (75 mg) on or under the tongue daily as needed (to treat migraines). Do not take more than one tablet per day. 16 tablet 11    Mounjaro Inject 0.5 mL (10 mg) under the skin every 7 days. Inject 10 mg once a week for 4 weeks, then increase to 12.5 mg weekly 2 mL 0    Mounjaro Inject 0.5 mL (12.5 mg) under the skin every 7 days. Inject 12.5 mg once a week for 4 weeks, then increase to 15 mg weekly 2 mL 0    Mounjaro Inject 0.5 mL (15 mg) under the skin every 7 days. After 4 weeks of the 12.5 mg/week dose, increase to 15 mg weekly. This is the maintenance dose. 2 mL 11    Mounjaro Inject 0.5 mL (2.5 mg) under the skin every  7 days. for 28 days. Then start the 5 mg into the skin every 7 days. 2 mL 0    Mounjaro Inject 0.5 mL (5 mg) under the skin every 7 days. Inject 5 mg every week for 4 weeks, then increase to 7.5 mg weekly 2 mL 0    Mounjaro Inject 0.5 mL (7.5 mg) under the skin every 7 days. Inject 7.5 mg once a week for 4 weeks, then increase to 10 mg weekly 2 mL 0    topiramate Take 1 tablet (50 mg) by mouth 2 times daily.      venlafaxine XR Take 1 capsule (75 mg) by mouth daily. 90 capsule 1      nitroGLYcerin  1 spray Sublingual Q5 Min PRN [name], MD   3 spray at 03/27/21 1439       All  No Known Allergies    ROS  A comprehensive review of 12 organ systems was performed and reviewed with the patient. Review of systems with the patient was negative except as indicated in the HPI above.    SH  Social History     Socioeconomic History    Marital status: Married     Spouse name: Not on file    Number of children: Not on file    Years of education: Not on file    Highest education level: Not on file   Occupational History    Not on file   Tobacco Use    Smoking status: Never    Smokeless tobacco: Never   Substance and Sexual Activity    Alcohol use: Not Currently    Drug use: Not on file    Sexual activity: Not on file   Other Topics Concern    Not on file   Social History Narrative    Not  on file     Social Determinants of Health     Financial Resource Strain: Low Risk  (05/21/2022)    Received from [facility] [place]    Overall [employer] (CARDIA)     Difficulty of Paying Living Expenses: Not very hard   Food Insecurity: Not on file   Transportation Needs: Not on file   Physical Activity: Not on file   Stress: Not on file   Social Connections: Not on file   Intimate Partner Violence: Not on file   Housing Stability: Not At Risk (06/14/2022)    Received from [facility]     Housing Stability: Not on file     Housing Stability: Not on file     FH  No family  history on file.    Past Surgical History:  No date: KNEE ARTHROPLASTY  No date: Left knee surgery     Physical Exam   09/02/23  0812   BP: (!) 171/115   Pulse: 79       Gen: pleasant, in NAD  Neuro: A&Ox3, no focal deficit  HEENT:  NC/AT. MMM  Neck: no bruit. JVD   CV: regular rate, normal rhythm, S1 S2   Resp: clear, no rales  Abd:  NABS soft ntnd  Ext: no c/c/e  Vasc: 2+ radial, DP pulses. Cap refill < 3 sec. Periphery warm    Labs  Lab Results   Component Value Date    RBC 5.07 07/29/2022    HGB 14.4 07/29/2022    HCT 44.9 07/29/2022    MCV 88.6 07/29/2022    MCHC 32.0 07/29/2022    RDW 15.2 (H) 07/29/2022    PLT 271 07/29/2022     Lab Results   Component Value Date    BUN 14 08/20/2023    CREAT 1.2 08/20/2023    CL 103 08/20/2023    K 4.0 08/20/2023    Pierce 9.0 08/20/2023    TBILI 0.6 08/20/2023    ALB 4.3 08/20/2023    AST 25 08/20/2023    ALK 52 08/20/2023    ALT 50 08/20/2023    GLU 122 08/20/2023     Lab Results   Component Value Date    INR 0.94 07/29/2022    PTT 28.5 07/29/2022       Lab Results   Component Value Date    BNP 21 03/27/2021     Lab Results   Component Value Date    A1C 7.8 (H) 08/20/2023     Lab Results   Component Value Date    CHOL 194 08/20/2023    LDLCALC 119 08/20/2023    TRIG 119 08/20/2023       Other Diagnostics  Coronary angiogram:  L[facility] on 03/27/2021:  CONCLUSIONS:   1. Mid LAD with 50% stenosis. RFR of mid LAD was 0.95. This was physiologically non-significant.   2. LVEDP 20-25 mm Hg.     RECOMMENDATIONS:   1. Maximize medical therapy.   2. Aggressive risk factor modification efforts.   3. Monitor vitals and arterial access site/pulses.    Echocardiogram 03/27/21:   1. Mildly decreased left ventricular systolic function. The left ventricular ejection fraction is 51% by Biplane.   2. Left ventricular ejection fraction is 53% by 3D.   3. Mild concentric left ventricular hypertrophy.   4. Normal LV filling pressures.   5. Normal right ventricular size and systolic function.    Exercise  stress echocardiogram 03/27/21:   1. Positive exercise stress test for ischemia.   2. Patient developed chest pain at peak excercise that improved with recovery and rest.   3. Stage 1: 1: Mid and apical inferior wall is abnormal.      Stage 2: 2: Apical inferior segment is abnormal.   4. The peak estimated pulmonary artery systolic pressure during exercise was indeterminate.   5. Average functional capacity.    Assessment and plan:   Keith Cross is a 62 year old male      #Hypertension  -Suspect significant improvement due to initiating CPAP for sleep apnea  -Continue carvedilol 25 mg BID, HCTZ 25 mg daily nitroglycerin patch 0.1 mg/hr for now.    -Amlodipine 10 mg daily, losartan 100 mg daily  -Unclear compliance to blood pressure medications.  Have provided list of medications for patient to check when he goes home.  Will start spironolactone 25 mg daily if compliance confirmed.  BMP in one week  -Prior secondary hypertension work-up negative for hyperaldosteronism and pheochromocytoma.  Normal creatinine.  His left arm blood pressure was actually higher than his right arm blood pressure which does not suggest coarctation of the aorta.    -Bilateral renal artery stenosis unlikely with normal creatinine on ARB, no hemodynamically significant renal artery stenosis on duplex US  -Following with sleep medicine for treatment of severe OSA.  Did encourage patient to follow up to encourage efficacy of PAP therapy given uncontrolled HTN    #Coronary artery disease  -Moderate non obstructive disease found on coronary angiogram  -Aspirin 81 mg daily, carvedilol 25 mg BID, atorvastatin 80 mg daily, amlodipine 10 mg daily, nitroglycerin 0.1 mg/hr patch  -He had abnormal exercise stress echocardiogram and was referred for coronary angiogram as above which showed non obstructive disease  -Consider microvascular disease?  Control blood pressure as above.  Nitroglycerin 0.1 mg/hr patch.    -Will refer to cardiac rehab to improve  exertional capacity    #Hyperlipidemia  -Goal LDL<70 mg/dL given CAD  -FLP 02/14/81: tc 167, hdl 47, ldL 93, TG 137  -FLP 06/19/21: TC 97, HDL 44, LDL 33, TG 98  -FLP 05/15/22: TC 170, HDL 60, LDL 92, TG 91  -FLP 07/28/22: TC 97, HDL 41, LDL 37, TG 46  -FLP 04/01/23: Cholesterol 167 (05/22) HDL 53 (05/22) LDL 99 (05/22) Triglycerides 76 (05/22)  -FLP 06/26/23: Cholesterol 201* (08/16) HDL 54 (08/16) LDL 124 (08/16) Triglycerides 117 (08/16)  -FLP 08/20/23: Cholesterol 194 (10/10) HDL 51 (10/10) LDL 119 (10/10) Triglycerides 119 (10/10)  -Evolocumab 140 mg subQ q2 weeks  -Add atorvastatin 80 mg daily  -Goal LDL    #Valve issue?  -Unclear issue with the valve.  TTE shows no significant valvular heart disease.  He has TTE in our system from 2016 showing aortic sclerosis with no other signficant valve disease    #Preprocedural evaluation  -Patient pending shoulder surgery  -Patient's RCRI is 0 placing him at 3.9% risk of MACE in 30 days.  -He has recent coronary angiogram showing no obstructive CAD  -Patient has no evidence of unstable coronary syndrome, decompensated heart failure or uncontrolled arrhythmias.  -He can proceed to planned procedure without further cardiac risk stratification.      RTC in two months with FLP before      I spent greater than 30 minutes on the day of the encounter reviewing the patient's diagnostic tests, performing a history and physical exam, writing orders, educating the patient, coordinating patient care  with the staff, and documenting in the electronic medical records system.             ICD-10-CM ICD-9-CM    1. Benign hypertensive heart disease without heart failure  I11.9 402.10       2. Hyperlipidemia, unspecified hyperlipidemia type  E78.5 272.4       3. Coronary artery disease involving native coronary artery of native heart without angina pectoris  I25.10 414.01       4. Chest pain, unspecified type  R07.9 786.50       5. Aortic valve disorder  I35.9 424.1            There are no Patient  Instructions on file for this visit.    [name], MD  Health Sciences Assistant Clinical Professor  Division of Cardiology  Department of Medicine  [place] [facility] [facility]

## 2023-08-27 NOTE — Progress Notes (Unsigned)
Endocrine Surgery Follow Up Visit    Visit Date:  08/28/2023    [place] Interpretor: Charlton Haws [phone]    HPI:  Keith Cross is a 62 year old year old male with PMH significant for CVA 2015, CAD, hypertension, hyperlipidemia, Type 2 DM, OSA on CPAP.      S/p right thyroid lobectomy on 08/01/22 for a 1.3 cm right thyroid nodule c/w papillary thyroid carcinoma. Procedure was uneventful and he was discharged home the same day.     Pathology c/w 1 cm right sided papillary thyroid carcinoma, classic type, (-) margins, (-) extrathyroidal extension, (-) lymphovascular invasion, (+) BRAF.      02/27/23: 6 month post op visit.Reports feeling well.     6 month surveillance [place] on 02/21/23 without evidence of recurrence.      Thyroid function tests in January WNL- not on thyroid hormone supplementation.    Interval History:    Presents today for 1 year follow up visit. Feeling ok. Concerned because blood pressure remains high. Biochemical workup negative for 9 mm left adrenal nodule.    1 year surveillance [place] on 08/20/23 without evidence of suspicious lesions within right thyroid bed or cervical lymphadenopathy.      Physical Exam:  Vital Signs:  Ht 5' 7.01" (1.702 m)   Wt 75.9 kg (167 lb 5.3 oz)   BMI 26.20 kg/m     General: no acute distress, voice clear  Neck: cervical incision well healed  CV: RRR  Pulm: non labored breathing  Neuro: no focal deficits      Labs:     Latest Reference Range & Units 08/20/23 15:32   Free T4 0.60 - 1.12 ng/dL 9.60   TSH 4.540 - 9.811 uIU/mL 3.098         Imaging:    THYROID/NECK SONOGRAM 08/20/2023        FINDINGS:     Thyroid isthmus: 0.2 cm in thickness  Right thyroid lobe: Surgically absent.  Left thyroid lobe: 4.3 x 1.6 x 1.5 cm     Background thyroid parenchyma: Homogeneous.     Thyroid nodules: No discrete thyroid nodules.     No morphologically abnormal or enlarged cervical chain lymph nodes.        IMPRESSION:     1.  Surgically absent right thyroid lobe. No suspicious lesions within the  resection bed or cervical lymphadenopathy.       Pathology:    Case Report   Surgical Pathology                                Case: [id number]                                   Authorizing Provider:  Runell Gess, MD         Collected:           08/01/2022 05:53 PM           Ordering Location:     [place] [facility]                    Received:            08/02/2022 09:03 AM           Pathologist:           Saralyn Pilar, MD  Specimen:    Thyroid, Right Lobe, RIGHT THYROID LOBE SHORT STITCH- ISTHMUS, LONG STITCH- RIGHT                    UPPER POLE                                                                                Final Diagnosis   A.         RIGHT THYROID LOBE, LOBECTOMY:  -Papillary thyroid carcinoma of the right lobe (1 cm), limited to the thyroid.  -No obvious lymphovascular invasion seen (see CAP Cancer Protocol Summary below).  -Surrounding multinodular hyperplastic parenchyma.      Electronically signed by Maris Berger, MD for Saralyn Pilar, MD on 08/08/2022 at 1256   Comment    Prior fine needle aspiration findings ([id number]) noted.  Multiple deeper levels were examined.      Synoptic Checklist   THYROID GLAND   8th Edition - Protocol posted: 3/22/2023THYROID GLAND: RESECTION - All Specimens  SPECIMEN   Procedure  Right lobectomy   TUMOR   Tumor Focality  Unifocal   Tumor Characteristics     Tumor Site  Right lobe   Tumor Size  Greatest Dimension (Centimeters): 1 cm   Additional Dimension (Centimeters)  0.5 cm     0.3 cm   Histologic Tumor Types and Subtypes  Papillary carcinoma, classic subtype   Tumor Proliferative Activity     Ki-67 Labeling Index  >3 %   Methodology  Manual count   Tumor Necrosis  Not identified   Angioinvasion (vascular invasion)  Not identified   Lymphatic Invasion  Not identified   Perineural Invasion  Not identified   Extrathyroidal Extension  Not identified   Margin Status  All margins negative for carcinoma    Distance from Invasive Carcinoma to Closest Margin  At least: 1 mm   Margin Comment  Right posterior (Block: A1)   REGIONAL LYMPH NODES   Regional Lymph Node Status  Not applicable (no regional lymph nodes submitted or found)   pTNM CLASSIFICATION (AJCC 8th Edition)   Reporting of pT, pN, and (when applicable) pM categories is based on information available to the pathologist at the time the report is issued. As per the AJCC (Chapter 1, 8th Ed.) it is the managing physician's responsibility to establish the final pathologic stage based upon all pertinent information, including but potentially not limited to this pathology report.        pT Category  pT1a   pN Category  pN not assigned (no nodes submitted or found)   Comment(s)  BRAF positive   .      Immunohistochemistry Interpretation    A2        BRAF                Positive              ERG                  Positive in endothelial cells              Ki-67  Slightly elevated: 3~5% of tumor cells       Assessment:  Keith Cross is a 62 year old year old male with PMH significant for CVA 2015, CAD, hypertension, hyperlipidemia, Type 2 DM, OSA on CPAP.      S/p right thyroid lobectomy on 08/01/22 for a 1.3 cm right thyroid nodule c/w papillary thyroid carcinoma. Procedure was uneventful and he was discharged home the same day.     Pathology c/w 1 cm right sided papillary thyroid carcinoma, classic type, (-) margins, (-) extrathyroidal extension, (-) lymphovascular invasion, (+) BRAF.     6 month surveillance [place] on 02/21/23 without evidence of recurrence.      1 year surveillance [place] on 08/20/23 without evidence of recurrence.       Plan:    Imaging reviewed with patient  Continues to have no evidence of recurrence at 1 year post op  Continue to follow with Dr. Cannon Kettle of endocrinology as scheduled  May follow up with me PRN      Chia Rock L. Baxter Flattery, MD  Assistant Clinical Professor of Surgery  [facility]

## 2023-08-28 ENCOUNTER — Ambulatory Visit: Payer: BC Managed Care – PPO | Attending: Surgery | Admitting: Surgery

## 2023-08-28 ENCOUNTER — Encounter: Payer: Self-pay | Admitting: Surgery

## 2023-08-28 VITALS — BP 165/106 | HR 71 | Temp 97.9°F | Resp 18 | Ht 67.01 in | Wt 167.3 lb

## 2023-08-28 DIAGNOSIS — C73 Malignant neoplasm of thyroid gland: Secondary | ICD-10-CM | POA: Insufficient documentation

## 2023-08-28 DIAGNOSIS — Z6826 Body mass index (BMI) 26.0-26.9, adult: Secondary | ICD-10-CM

## 2023-09-02 ENCOUNTER — Other Ambulatory Visit (HOSPITAL_BASED_OUTPATIENT_CLINIC_OR_DEPARTMENT_OTHER)
Admission: RE | Admit: 2023-09-02 | Discharge: 2023-09-02 | Disposition: A | Discharge status: Home Health | Payer: BC Managed Care – PPO

## 2023-09-02 ENCOUNTER — Ambulatory Visit: Payer: BC Managed Care – PPO | Attending: Cardiovascular Disease | Admitting: Cardiovascular Disease

## 2023-09-02 VITALS — BP 171/115 | HR 79 | Wt 173.7 lb

## 2023-09-02 DIAGNOSIS — E785 Hyperlipidemia, unspecified: Secondary | ICD-10-CM | POA: Insufficient documentation

## 2023-09-02 DIAGNOSIS — I119 Hypertensive heart disease without heart failure: Secondary | ICD-10-CM | POA: Insufficient documentation

## 2023-09-02 DIAGNOSIS — R079 Chest pain, unspecified: Secondary | ICD-10-CM | POA: Insufficient documentation

## 2023-09-02 DIAGNOSIS — I359 Nonrheumatic aortic valve disorder, unspecified: Secondary | ICD-10-CM | POA: Insufficient documentation

## 2023-09-02 DIAGNOSIS — Z6827 Body mass index (BMI) 27.0-27.9, adult: Secondary | ICD-10-CM | POA: Insufficient documentation

## 2023-09-02 DIAGNOSIS — I2510 Atherosclerotic heart disease of native coronary artery without angina pectoris: Secondary | ICD-10-CM | POA: Insufficient documentation

## 2023-09-02 LAB — LIPID(CHOL FRACT) PANEL, BLOOD
Cholesterol: 181 mg/dL (ref <200)
HDL Cholesterol: 60 mg/dL (ref >40)
LDL Cholesterol (calc): 106 mg/dL (ref <160)
Non HDL Cholesterol (calculated): 121 mg/dL (ref <130)
Triglycerides: 75 mg/dL (ref <150)
VLDL Cholesterol (calculated): 15 mg/dL

## 2023-09-02 LAB — BASIC METABOLIC PANEL, BLOOD
BUN: 15 mg/dL (ref 7–25)
CO2: 24 mmol/L (ref 21–31)
Calcium: 9.3 mg/dL (ref 8.6–10.3)
Chloride: 102 mmol/L (ref 98–107)
Creat: 1.1 mg/dL (ref 0.7–1.3)
Electrolyte Balance: 12 mmol/L (ref 2–12)
Glucose: 146 mg/dL — ABNORMAL HIGH (ref 85–125)
Potassium: 3.9 mmol/L (ref 3.5–5.1)
Sodium: 138 mmol/L (ref 136–145)
eGFR - high estimate: >60 (ref >59)
eGFR - low estimate: >60 (ref >59)

## 2023-09-02 MED ORDER — ATORVASTATIN CALCIUM 80 MG OR TABS
80.0000 mg | ORAL_TABLET | Freq: Every day | ORAL | 3 refills | Status: AC
Start: 2023-09-02 — End: ?

## 2023-09-02 MED ORDER — SPIRONOLACTONE 25 MG OR TABS
25.0000 mg | ORAL_TABLET | Freq: Every day | ORAL | 3 refills | Status: AC
Start: 2023-09-02 — End: ?

## 2023-09-02 NOTE — Patient Instructions (Addendum)
Please go home and check your home medications.  If you are taking them, then start the spironolactone.  If not, then start the medications below first and wait one week.  If blood pressures still high, then start the spironolactone    Please re-start your atorvastatin 80 mg daily    Medication list:  Carvedilol 25 mg twice daily  Hydrochlorothiazide 25 mg daily  Losartan 100 mg daily  Amlodipine 10 mg daily  Nitroglycerin patch    Stop taking your lasix    Get your labs checked in one week once you start the spironolactone

## 2023-10-19 ENCOUNTER — Telehealth: Payer: Self-pay

## 2023-10-19 ENCOUNTER — Encounter: Payer: Self-pay | Admitting: Cardiovascular Disease

## 2023-10-19 DIAGNOSIS — E785 Hyperlipidemia, unspecified: Secondary | ICD-10-CM

## 2023-10-19 NOTE — Telephone Encounter (Signed)
-----   Message from [name] sent at 10/19/2023  8:50 AM PST -----  Hi,     Can this patient please have fasting lipids checked prior to the next appointment?    Thanks,  [name]

## 2023-10-19 NOTE — Telephone Encounter (Signed)
Called patient to remind them that per Dr Renaldo Reel he should get fasting Lipids done prior to follow up on 10/22/23. Left voicemail for call back if any questions.    Ross Marcus, LVN

## 2023-10-19 NOTE — Progress Notes (Deleted)
[facility] Note    Patient: Keith Cross  Patient DOB: 1961-06-06  Patient ID: [id number]  Patient PCP: Suzan Slick  Date of Service: 10/22/2023    Reason for Visit  No chief complaint on file.       HPI  Keith Cross is a 62 year old male    Interval History  Patients last seen 09/02/23    Interpeter: Cleda Daub [id number]    Still having dizziness, having some chest discomfort    Blood pressures still high, 180/110    Still having some fatigue with exertion.      Brings in bag with pills.  Notably, blood pressure medications are losartan 100 mg daily, jardiance 10 mg daily, hydralazine 25 mg as needed.  He reports these are his morning pills and he takes other pills later    Problem List  Patient Active Problem List    Diagnosis Date Noted    Other headache syndrome 06/26/2023    Papillary thyroid carcinoma (CMS-HCC) 07/25/2022    Cavernous malformation 07/04/2021    OSA (obstructive sleep apnea) 04/17/2021    Chest pain 03/27/2021    Abnormal stress echo 03/27/2021        PMH  Past Medical History:   Diagnosis Date    Angina pectoris (CMS-HCC)     Dizziness     Headache 12/2018    History of CVA (cerebrovascular accident)     Hyperlipidemia     Hypertension     Sleep apnea 2014    Stroke (CMS-HCC)     TIA    Type 2 diabetes mellitus (CMS-HCC)     Visual impairment        Meds   acetaminophen Take 2 tablets (650 mg) by mouth.      albuterol TAKE 2 PUFFS EVERY 4 HOURS      allopurinol Take 1 tablet (300 mg) by mouth daily.      amLODIPINE Take 1 tablet (10 mg) by mouth daily. 90 tablet 3    aspirin Take 1 tablet (81 mg) by mouth daily. 90 tablet 3    atorvastatin Take 1 tablet (80 mg) by mouth daily. 90 tablet 3    blood glucose PHARMACY: Please dispense insurance approved bluetooth enable meter. 1 each 0    carvedilol Take 1 tablet (25 mg) by mouth 2 times daily (with meals). 180 tablet 3    colchicine TAKE 2 TABLETS NOW AND TAKE 1 TABLET 1 HOUR LATER FOR 1 DAY THEN TWICE A DAY      Freestyle Libre 3 Insert  subcutaneously every 14 days. 6 each 3    empagliflozin Take 1 tablet (25 mg) by mouth daily. 90 tablet 3    Repatha Inject 140 mg under the skin every 14 days. 2 mL 3    ezetimibe Take 1 tablet (10 mg) by mouth daily. 90 tablet 3    furosemide TAKE 1 TABLET BY MOUTH EVERY DAY IN THE MORNING      galcanezumab-gnlm Inject 1 mL (120 mg) under the skin every 30 days. 1 mL 11    glucose blood 1 strip by Other route every morning (before breakfast). 100 strip 11    hydrALAZINE Take 1 tablet (25 mg) by mouth as needed (Take if SBP>160, up to twice daily). 90 tablet 3    hydroCHLOROthiazide Take 1 tablet (25 mg) by mouth daily.      insulin degludec Inject 10 Units under the skin at bedtime. 30 mL 3  isosorbide mononitrate       lancets 1 each by Other route every morning (before breakfast). 100 Lancet. 11    losartan Take 1 tablet (100 mg) by mouth daily. 90 tablet 3    meclizine       metFORMIN Take 1 tablet (1,000 mg) by mouth 2 times daily.      nitroGLYcerin Apply 1 patch topically daily. 30 patch 0    nitroGLYcerin       ondansetron TAKE 1 TABLET BY MOUTH EVERY 6 HOURS AS NEEDED FOR NAUSEA OR VOMITING.      pantoprazole Take 1 tablet (40 mg) by mouth daily.      pioglitazone Take 1 tablet (30 mg) by mouth.      rimegepant Dissolve 1 tablet (75 mg) on or under the tongue daily as needed (to treat migraines). Do not take more than one tablet per day. 16 tablet 11    spironolactone Take 1 tablet (25 mg) by mouth daily. 90 tablet 3    Mounjaro Inject 0.5 mL (10 mg) under the skin every 7 days. Inject 10 mg once a week for 4 weeks, then increase to 12.5 mg weekly 2 mL 0    Mounjaro Inject 0.5 mL (12.5 mg) under the skin every 7 days. Inject 12.5 mg once a week for 4 weeks, then increase to 15 mg weekly 2 mL 0    Mounjaro Inject 0.5 mL (15 mg) under the skin every 7 days. After 4 weeks of the 12.5 mg/week dose, increase to 15 mg weekly. This is the maintenance dose. 2 mL 11    Mounjaro Inject 0.5 mL (2.5 mg) under the  skin every 7 days. for 28 days. Then start the 5 mg into the skin every 7 days. 2 mL 0    Mounjaro Inject 0.5 mL (5 mg) under the skin every 7 days. Inject 5 mg every week for 4 weeks, then increase to 7.5 mg weekly 2 mL 0    Mounjaro Inject 0.5 mL (7.5 mg) under the skin every 7 days. Inject 7.5 mg once a week for 4 weeks, then increase to 10 mg weekly 2 mL 0    topiramate Take 1 tablet (50 mg) by mouth 2 times daily.      venlafaxine XR Take 1 capsule (75 mg) by mouth daily. 90 capsule 1      nitroGLYcerin  1 spray Sublingual Q5 Min PRN [name], MD   3 spray at 03/27/21 1439       All  No Known Allergies    ROS  A comprehensive review of 12 organ systems was performed and reviewed with the patient. Review of systems with the patient was negative except as indicated in the HPI above.    SH  Social History     Socioeconomic History    Marital status: Married     Spouse name: Not on file    Number of children: Not on file    Years of education: Not on file    Highest education level: Not on file   Occupational History    Not on file   Tobacco Use    Smoking status: Never    Smokeless tobacco: Never   Substance and Sexual Activity    Alcohol use: Not Currently    Drug use: Not on file    Sexual activity: Not on file   Other Topics Concern    Not on file   Social History Narrative  Not on file     Social Determinants of Health     Financial Resource Strain: Low Risk  (05/21/2022)    Received from [facility] and [place] and [place], [address] [place]    Overall Physicist, medical Strain (CARDIA)     Difficulty of Paying Living Expenses: Not very hard   Food Insecurity: Not on file   Transportation Needs: Not on file   Physical Activity: Not on file   Stress: Not on file   Social Connections: Not on file   Intimate Partner Violence: Not on file   Housing Stability: Not At Risk (06/14/2022)    Received from [facility] Stability     Housing Stability: Not  on file     Housing Stability: Not on file     FH  No family history on file.    Past Surgical History:  No date: KNEE ARTHROPLASTY  No date: Left knee surgery     Physical Exam  There were no vitals filed for this visit.      Gen: pleasant, in NAD  Neuro: A&Ox3, no focal deficit  HEENT:  NC/AT. MMM  Neck: no bruit. JVD   CV: regular rate, normal rhythm, S1 S2   Resp: clear, no rales  Abd:  NABS soft ntnd  Ext: no c/c/e  Vasc: 2+ radial, DP pulses. Cap refill < 3 sec. Periphery warm    Labs  Lab Results   Component Value Date    RBC 5.07 07/29/2022    HGB 14.4 07/29/2022    HCT 44.9 07/29/2022    MCV 88.6 07/29/2022    MCHC 32.0 07/29/2022    RDW 15.2 (H) 07/29/2022    PLT 271 07/29/2022     Lab Results   Component Value Date    BUN 15 09/02/2023    CREAT 1.1 09/02/2023    CL 102 09/02/2023    K 3.9 09/02/2023    [place] 9.3 09/02/2023    TBILI 0.6 08/20/2023    ALB 4.3 08/20/2023    AST 25 08/20/2023    ALK 52 08/20/2023    ALT 50 08/20/2023    GLU 146 (H) 09/02/2023     Lab Results   Component Value Date    INR 0.94 07/29/2022    PTT 28.5 07/29/2022       Lab Results   Component Value Date    BNP 21 03/27/2021     Lab Results   Component Value Date    A1C 7.8 (H) 08/20/2023     Lab Results   Component Value Date    CHOL 181 09/02/2023    LDLCALC 106 09/02/2023    TRIG 75 09/02/2023       Other Diagnostics  Coronary angiogram:  [facility] on 03/27/2021:  CONCLUSIONS:   1. Mid LAD with 50% stenosis. RFR of mid LAD was 0.95. This was physiologically non-significant.   2. LVEDP 20-25 mm Hg.     RECOMMENDATIONS:   1. Maximize medical therapy.   2. Aggressive risk factor modification efforts.   3. Monitor vitals and arterial access site/pulses.    Echocardiogram 03/27/21:   1. Mildly decreased left ventricular systolic function. The left ventricular ejection fraction is 51% by Biplane.   2. Left ventricular ejection fraction is 53% by 3D.   3. Mild concentric left ventricular hypertrophy.   4. Normal LV filling pressures.   5. Normal  right ventricular size and systolic function.  Exercise stress echocardiogram 03/27/21:   1. Positive exercise stress test for ischemia.   2. Patient developed chest pain at peak excercise that improved with recovery and rest.   3. Stage 1: 1: Mid and apical inferior wall is abnormal.      Stage 2: 2: Apical inferior segment is abnormal.   4. The peak estimated pulmonary artery systolic pressure during exercise was indeterminate.   5. Average functional capacity.    Assessment and plan:   Keith Cross is a 62 year old male      #Hypertension  -Suspect significant improvement due to initiating CPAP for sleep apnea  -Continue carvedilol 25 mg BID, HCTZ 25 mg daily nitroglycerin patch 0.1 mg/hr for now.    -Amlodipine 10 mg daily, losartan 100 mg daily  -Unclear compliance to blood pressure medications.  Have provided list of medications for patient to check when he goes home.  Will start spironolactone 25 mg daily if compliance confirmed.  BMP in one week  -Prior secondary hypertension work-up negative for hyperaldosteronism and pheochromocytoma.  Normal creatinine.  His left arm blood pressure was actually higher than his right arm blood pressure which does not suggest coarctation of the aorta.    -Bilateral renal artery stenosis unlikely with normal creatinine on ARB, no hemodynamically significant renal artery stenosis on duplex US  -Following with sleep medicine for treatment of severe OSA.  Did encourage patient to follow up to encourage efficacy of PAP therapy given uncontrolled HTN    #Coronary artery disease  -Moderate non obstructive disease found on coronary angiogram  -Aspirin 81 mg daily, carvedilol 25 mg BID, atorvastatin 80 mg daily, amlodipine 10 mg daily, nitroglycerin 0.1 mg/hr patch  -He had abnormal exercise stress echocardiogram and was referred for coronary angiogram as above which showed non obstructive disease  -Consider microvascular disease?  Control blood pressure as above.  Nitroglycerin 0.1  mg/hr patch.    -Will refer to cardiac rehab to improve exertional capacity    #Hyperlipidemia  -Goal LDL<70 mg/dL given CAD  -FLP 11/15/08: tc 167, hdl 47, ldL 93, TG 137  -FLP 06/19/21: TC 97, HDL 44, LDL 33, TG 98  -FLP 05/15/22: TC 170, HDL 60, LDL 92, TG 91  -FLP 07/28/22: TC 97, HDL 41, LDL 37, TG 46  -FLP 04/01/23: Cholesterol 167 (05/22) HDL 53 (05/22) LDL 99 (05/22) Triglycerides 76 (05/22)  -FLP 06/26/23: Cholesterol 201* (08/16) HDL 54 (08/16) LDL 124 (08/16) Triglycerides 117 (08/16)  -FLP 08/20/23: Cholesterol 194 (10/10) HDL 51 (10/10) LDL 119 (10/10) Triglycerides 119 (10/10)  -Evolocumab 140 mg subQ q2 weeks  -Add atorvastatin 80 mg daily  -Goal LDL<70    #Valve issue?  -Unclear issue with the valve.  TTE shows no significant valvular heart disease.  He has TTE in our system from 2016 showing aortic sclerosis with no other signficant valve disease    #Preprocedural evaluation  -Patient pending shoulder surgery  -Patient's RCRI is 0 placing him at 3.9% risk of MACE in 30 days.  -He has recent coronary angiogram showing no obstructive CAD  -Patient has no evidence of unstable coronary syndrome, decompensated heart failure or uncontrolled arrhythmias.  -He can proceed to planned procedure without further cardiac risk stratification.      RTC in two months with FLP before      I spent greater than 30 minutes on the day of the encounter reviewing the patient's diagnostic tests, performing a history and physical exam, writing orders, educating the patient, coordinating patient  care with the staff, and documenting in the electronic medical records system.             ICD-10-CM ICD-9-CM    1. Benign hypertensive heart disease without heart failure  I11.9 402.10       2. Hyperlipidemia, unspecified hyperlipidemia type  E78.5 272.4       3. Coronary artery disease involving native coronary artery of native heart without angina pectoris  I25.10 414.01       4. Chest pain, unspecified type  R07.9 786.50       5. Aortic  valve disorder  I35.9 424.1            There are no Patient Instructions on file for this visit.    [name], MD  Health Sciences Assistant Clinical Professor  Division of Cardiology  Department of Medicine  [place] [facility]

## 2023-10-22 ENCOUNTER — Ambulatory Visit: Payer: BC Managed Care – PPO | Admitting: Cardiovascular Disease

## 2023-10-22 DIAGNOSIS — E785 Hyperlipidemia, unspecified: Secondary | ICD-10-CM

## 2023-10-22 DIAGNOSIS — I359 Nonrheumatic aortic valve disorder, unspecified: Secondary | ICD-10-CM

## 2023-10-22 DIAGNOSIS — I2510 Atherosclerotic heart disease of native coronary artery without angina pectoris: Secondary | ICD-10-CM

## 2023-10-22 DIAGNOSIS — R079 Chest pain, unspecified: Secondary | ICD-10-CM

## 2023-10-22 DIAGNOSIS — I119 Hypertensive heart disease without heart failure: Secondary | ICD-10-CM

## 2024-01-28 ENCOUNTER — Ambulatory Visit: Payer: BC Managed Care – PPO | Admitting: Student in an Organized Health Care Education/Training Program

## 2024-01-28 NOTE — Progress Notes (Deleted)
 ENDOCRINOLOGY OUTPATIENT VISIT - [facility]    Date and Time of Evaluation:  Thursday January 28, 2024    Primary Care Physician:  Terri Fester [facility]     Chief Complaint:   No chief complaint on file.      History of Present Illness:    Keith Cross is a 63 year old male with CVA 2015, HTN, HLD, and T2DM, here for evaluation and management of adrenal nodule.    Subcentimeter adrenal nodule (0.9 cm) on 10/2020 CTAP with 28 HU and absolute/relative contrast washout of 74%/42%, c/w benign adrenal adenoma.     Was seen by Dr. Jarold Merlin of Endocrine Surgery given history of uncontrolled hypertension and previous hormonal w/up with elevated plasma metanephrines 62 (< 57) and normetanephrines 252 (< 148), though less than 2x ULN. Aldosterone level was normal at 8. Repeat labs with Dr. Shiela Dooms with unremarkable plasma metanephrines (normetanephrines very minimally elevated at 0.98, normal < 0.9). 24-hour urine metanephrines were wnl. Aldosterone/renin wnl with normal dexamethasone  suppression test.      Currently on multiple hypertensive agents, including clonidine , carvedilol , hydralazine , isosorbide  mononitrate, amlodipine , losartan , metoprolol , and furosemide . BP 181/108 in clinic today.     Patient does endorse headaches in the morning and late evening. He does not have a Neurologist. Also endorses episodes of palpitations, diaphoresis, SOB, and chest pain. Advised patient he needs to present to ER if he has these symptoms. Has Cardiology referral. Reports diarrhea with mucus in stool once every few weeks.     08/20/2023 follow up:    Tolerating Mounjaro  15 mg weekly. Has decreased rice intake to 1 bowl daily.     Has repeat thyroid  US  scheduled.     BPs remain uncontrolled despite multiple antihypertensives; following with cardiology    Past Medical/Surgical History:  Past Medical History:   Diagnosis Date    Angina pectoris     Dizziness     Headache 12/2018    History of CVA (cerebrovascular accident)     Hyperlipidemia      Hypertension     Sleep apnea 2014    Stroke (CMS-HCC)     TIA    Type 2 diabetes mellitus (CMS-HCC)     Visual impairment        Family History:  No family history on file.    Social History:  none     Socioeconomic History    Marital status: Married   Tobacco Use    Smoking status: Never    Smokeless tobacco: Never   Substance and Sexual Activity    Alcohol use: Not Currently     Social Determinants of Health     Financial Resource Strain: Low Risk  (05/21/2022)    Received from [facility] [place]  and [place] , [employer] [facility] [place]  and [facility]     Overall Physicist, medical Strain (CARDIA)     Difficulty of Paying Living Expenses: Not very hard    [facility]    Housing Stability       Allergies:  No Known Allergies    Medications:  Current Outpatient Medications on File Prior to Visit   Medication Sig Dispense Refill    acetaminophen  (TYLENOL ) 325 MG tablet Take 2 tablets (650 mg) by mouth.      albuterol  108 (90 Base) MCG/ACT inhaler TAKE 2 PUFFS EVERY 4 HOURS      allopurinol  (ZYLOPRIM ) 300 MG tablet Take 1 tablet (300 mg) by mouth daily.  amLODIPINE  (NORVASC ) 10 MG tablet Take 1 tablet (10 mg) by mouth daily. 90 tablet 3    aspirin  81 MG EC tablet Take 1 tablet (81 mg) by mouth daily. 90 tablet 3    atorvastatin  (LIPITOR) 80 MG tablet Take 1 tablet (80 mg) by mouth daily. 90 tablet 3    blood glucose meter PHARMACY: Please dispense insurance approved bluetooth enable meter. 1 each 0    carvedilol  (COREG ) 25 MG tablet Take 1 tablet (25 mg) by mouth 2 times daily (with meals). 180 tablet 3    colchicine  0.6 MG tablet TAKE 2 TABLETS NOW AND TAKE 1 TABLET 1 HOUR LATER FOR 1 DAY THEN TWICE A DAY      continuous blood glucose (FREESTYLE LIBRE 3) sensor Insert subcutaneously every 14 days. 6 each 3    empagliflozin  (JARDIANCE ) 25 mg tablet Take 1 tablet (25 mg) by mouth daily. 90 tablet 3    Evolocumab  (REPATHA ) 140 MG/ML injection  Inject 140 mg under the skin every 14 days. 2 mL 3    ezetimibe  (ZETIA ) 10 MG tablet Take 1 tablet (10 mg) by mouth daily. 90 tablet 3    furosemide  (LASIX ) 40 MG tablet TAKE 1 TABLET BY MOUTH EVERY DAY IN THE MORNING      galcanezumab -gnlm (EMGALITY ) 120 mg/mL injection pen Inject 1 mL (120 mg) under the skin every 30 days. 1 mL 11    glucose blood test strip 1 strip by Other route every morning (before breakfast). 100 strip 11    hydrALAZINE  (APRESOLINE ) 25 MG tablet Take 1 tablet (25 mg) by mouth as needed (Take if SBP>160, up to twice daily). 90 tablet 3    hydroCHLOROthiazide  (HYDRODIURIL ) 25 MG tablet Take 1 tablet (25 mg) by mouth daily.      insulin  degludec 100 units/mL Inject 10 Units under the skin at bedtime. 30 mL 3    isosorbide  mononitrate (IMDUR ) 60 MG Controlled-Release tablet       lancets 1 each by Other route every morning (before breakfast). 100 Lancet. 11    losartan  (COZAAR ) 100 MG tablet Take 1 tablet (100 mg) by mouth daily. 90 tablet 3    meclizine  (ANTIVERT ) 25 MG tablet       metFORMIN  (GLUCOPHAGE ) 1000 mg tablet Take 1 tablet (1,000 mg) by mouth 2 times daily.      nitroGLYcerin  (NITRODUR) 0.1 MG/HR patch Apply 1 patch topically daily. 30 patch 0    nitroGLYcerin  (NITROSTAT ) 0.4 MG SL tablet       ondansetron  (ZOFRAN  ODT) 4 MG disintegrating tablet TAKE 1 TABLET BY MOUTH EVERY 6 HOURS AS NEEDED FOR NAUSEA OR VOMITING.      pantoprazole  (PROTONIX ) 40 MG tablet Take 1 tablet (40 mg) by mouth daily.      pioglitazone  (ACTOS ) 30 mg tablet Take 1 tablet (30 mg) by mouth.      rimegepant (NURTEC ODT ) tablet Dissolve 1 tablet (75 mg) on or under the tongue daily as needed (to treat migraines). Do not take more than one tablet per day. 16 tablet 11    spironolactone  (ALDACTONE ) 25 MG tablet Take 1 tablet (25 mg) by mouth daily. 90 tablet 3    Tirzepatide  (MOUNJARO ) 10 MG/0.5ML injection pen Inject 0.5 mL (10 mg) under the skin every 7 days. Inject 10 mg once a week for 4 weeks, then increase to  12.5 mg weekly 2 mL 0    Tirzepatide  (MOUNJARO ) 12.5 MG/0.5ML injection pen Inject 0.5 mL (12.5 mg) under the  skin every 7 days. Inject 12.5 mg once a week for 4 weeks, then increase to 15 mg weekly 2 mL 0    Tirzepatide  (MOUNJARO ) 15 MG/0.5ML injection pen Inject 0.5 mL (15 mg) under the skin every 7 days. After 4 weeks of the 12.5 mg/week dose, increase to 15 mg weekly. This is the maintenance dose. 2 mL 11    Tirzepatide  (MOUNJARO ) 2.5 MG/0.5ML injection pen Inject 0.5 mL (2.5 mg) under the skin every 7 days. for 28 days. Then start the 5 mg into the skin every 7 days. 2 mL 0    Tirzepatide  (MOUNJARO ) 5 MG/0.5ML injection pen Inject 0.5 mL (5 mg) under the skin every 7 days. Inject 5 mg every week for 4 weeks, then increase to 7.5 mg weekly 2 mL 0    Tirzepatide  (MOUNJARO ) 7.5 MG/0.5ML injection pen Inject 0.5 mL (7.5 mg) under the skin every 7 days. Inject 7.5 mg once a week for 4 weeks, then increase to 10 mg weekly 2 mL 0    topiramate  (TOPAMAX ) 50 MG tablet Take 1 tablet (50 mg) by mouth 2 times daily.      venlafaxine  XR (EFFEXOR  XR) 75 MG capsule Take 1 capsule (75 mg) by mouth daily. 90 capsule 1     Current Facility-Administered Medications on File Prior to Visit   Medication Dose Route Frequency Provider Last Rate Last Admin    nitroGLYcerin  (NITROLINGUAL ) spray 1 spray  1 spray Sublingual Q5 Min PRN Glenard Lander, MD   3 spray at 03/27/21 1439       Physical Exam:  Vital Signs: There were no vitals taken for this visit.    General:  No acute distress  HEENT: No proptosis, lid lag, or periorbital edema. No scleral icterus   Lungs: Symmetric chest rise, normal work of breathing  Neurological: Alerted and oriented x 4  Skin:  No visible lesions  Psych: Appropriate mood and affect    Diagnostic Data:    Laboratory Data:    Lab Results   Component Value Date    HGB 14.4 07/29/2022    HCT 44.9 07/29/2022    PLT 271 07/29/2022    RBC 5.07 07/29/2022    MCV 88.6 07/29/2022    RDW 15.2 (H) 07/29/2022    NEUTP 56.2  07/29/2022    NEUTAB 3.5 07/29/2022    EOSAB 0.3 07/29/2022       Lab Results   Component Value Date/Time    SODIUM 138 09/02/2023 08:44 AM    K 3.9 09/02/2023 08:44 AM    CL 102 09/02/2023 08:44 AM    CO2 24 09/02/2023 08:44 AM    BUN 15 09/02/2023 08:44 AM    CREAT 1.1 09/02/2023 08:44 AM    GLU 146 (H) 09/02/2023 08:44 AM    [place] 9.3 09/02/2023 08:44 AM    MG 2.0 03/28/2021 08:28 AM    PHOS 3.9 03/28/2021 08:28 AM    TPROT 7.2 08/20/2023 03:32 PM    ALB 4.3 08/20/2023 03:32 PM    ALK 52 08/20/2023 03:32 PM    TBILI 0.6 08/20/2023 03:32 PM    AST 25 08/20/2023 03:32 PM    ALT 50 08/20/2023 03:32 PM       Lab Results   Component Value Date/Time    A1C 7.8 (H) 08/20/2023 03:32 PM    A1C 8.0 (H) 08/20/2023 01:58 PM       Lab Results   Component Value Date/Time    TSHHS 3.098 08/20/2023  03:32 PM    FREET4 0.97 08/20/2023 03:32 PM     02/2023 Thyroid  US     IMPRESSION:     1.  Right thyroid  lobe is surgically absent without abnormal findings in the surgical bed. The left thyroid  lobe is unremarkable in appearance.     END     06/18/2022 Thyroid  FNA        07/2022 Right Lobectomy    A.         RIGHT THYROID  LOBE, LOBECTOMY:  -           Papillary thyroid  carcinoma of the right lobe (1 cm), limited to the thyroid .  -           No obvious lymphovascular invasion seen (see CAP Cancer Protocol Summary below).  -           Surrounding multinodular hyperplastic parenchyma.      Electronically signed by Colene Dauphin, MD for Dennie Fitch, MD on 08/08/2022 at 1256   Comment    Prior fine needle aspiration findings ([id number]) noted.  Multiple deeper levels were examined.      Synoptic Checklist   THYROID  GLAND   8th Edition - Protocol posted: 3/22/2023THYROID GLAND: RESECTION - All Specimens  SPECIMEN   Procedure  Right lobectomy   TUMOR   Tumor Focality  Unifocal   Tumor Characteristics     Tumor Site  Right lobe   Tumor Size  Greatest Dimension (Centimeters): 1 cm   Additional Dimension (Centimeters)  0.5 cm     0.3 cm    Histologic Tumor Types and Subtypes  Papillary carcinoma, classic subtype   Tumor Proliferative Activity     Ki-67 Labeling Index  >3 %   Methodology  Manual count   Tumor Necrosis  Not identified   Angioinvasion (vascular invasion)  Not identified   Lymphatic Invasion  Not identified   Perineural Invasion  Not identified   Extrathyroidal Extension  Not identified   Margin Status  All margins negative for carcinoma   Distance from Invasive Carcinoma to Closest Margin  At least: 1 mm   Margin Comment  Right posterior (Block: A1)   REGIONAL LYMPH NODES   Regional Lymph Node Status  Not applicable (no regional lymph nodes submitted or found)   pTNM CLASSIFICATION (AJCC 8th Edition)   Reporting of pT, pN, and (when applicable) pM categories is based on information available to the pathologist at the time the report is issued. As per the AJCC (Chapter 1, 8th Ed.) it is the managing physician's responsibility to establish the final pathologic stage based upon all pertinent information, including but potentially not limited to this pathology report.        pT Category  pT1a   pN Category  pN not assigned (no nodes submitted or found)   Comment(s)  BRAF positive   .      Immunohistochemistry Interpretation    A2        BRAF                Positive              ERG                  Positive in endothelial cells              Ki-67                Slightly elevated: 3~5% of tumor cells  Impression:  Keith Cross is a 63 year old male with CVA 2015, HTN, HLD, and T2DM, here for evaluation and management of adrenal nodule.    # T2DM   - A1c 8.0% in 08/2023, goal < 8% without hypoglycemia given hx of CAD  - MTF 1g BID  - Jardiance  25 mg daily   - Actos  30 mg  - Mounjaro  15 mg weekly   - Start Tresiba  10 units daily   - Start [employer] 3  - Will obtain fasting labs    Retinopathy: ?Retinopathy. Exam exam: Scheduled  Nephropathy: None. UMA wnl 03/2023. ACEi/ARB: Losartan  100 mg   Neuropathy: +Neuropathy.   CVD: +CVA.  LDL 124. Statin: Atorva 80    # Papillary thyroid  cancer  - 1.3 cm right TR 5 nodule s/p FNA c/w PTC in 06/2022  - S/p right lobectomy with Dr. Shiela Dooms with 1 cm unifocal classic PTC without concerning features  - No plan for completion at this time  - Normal TFTS 11/2022  - Stable thyroid  US  02/2023; repeat scheduled this week     # Adrenal nodule  - Initially detected on 10/2020 CTAP with 28 HU and absolute/relative contrast washout of 74%/42%, c/w benign adrenal adenoma  - LNSC, 24-hour UFC, renin/aldo, and metanephrines all unremarkable 1-12/2022  - Stable nodule on 12/2022 CT abdomen   - Given stable labs and imaging, will hold of on further monitoring at this time given size < 1 cm     #HTN  - Refractory despite multiple antihypertensives (amlodipine  10 mg daily, coreg  25 mg BID, hctz 25 mg daily, losartan  100 mg daily)   - Negative workup for hyperaldosteronism and pheochromocytoma.   - On CPAP  - Continue f/u with Cardiology     Will obtain labs this week and again before next visit     Future Appointments   Date Time Provider Department Center   08/20/2023  3:00 PM [place] [facility] US  1 [facility]   08/28/2023 11:30 AM Marylouise Socks, MD [place] [facility] [address] [place]   09/02/2023  8:00 AM Glenard Lander, MD [facility]   01/28/2024  3:00 PM Daven Pinckney, Nicolas Barren, MD [facility]     Katianna Mcclenney M Lashaye Fisk, MD

## 2024-02-15 ENCOUNTER — Telehealth: Payer: Self-pay

## 2024-02-15 NOTE — Telephone Encounter (Signed)
-----   Message from [name] sent at 02/15/2024 11:57 AM PDT -----  Hi,     Can this patient please have fasting lipids checked prior to the next appointment?    Thanks,  [name]

## 2024-02-15 NOTE — Progress Notes (Unsigned)
 [facility] Cardiology Clinic Note    Patient: Keith Cross  Patient DOB: 25-Mar-1961  Patient ID: [id number]  Patient PCP: Trinna Furbish  Date of Service: 03/02/2024    Reason for Visit  No chief complaint on file.       HPI  Keith Cross is a 63 year old male

## 2024-02-15 NOTE — Telephone Encounter (Signed)
 Called patient, no answer, left voicemail for patient reminding them to complete FLP prior to follow up visit with Dr. Renaldo Reel.     Imberly Troxler  RN, BSN, CMSRN   Clinical Nurse II   [facility]  [facility] [employer]

## 2024-03-02 ENCOUNTER — Ambulatory Visit: Payer: BC Managed Care – PPO | Admitting: Cardiovascular Disease

## 2024-03-02 DIAGNOSIS — E785 Hyperlipidemia, unspecified: Secondary | ICD-10-CM

## 2024-03-02 DIAGNOSIS — I2510 Atherosclerotic heart disease of native coronary artery without angina pectoris: Secondary | ICD-10-CM

## 2024-03-02 DIAGNOSIS — I119 Hypertensive heart disease without heart failure: Secondary | ICD-10-CM

## 2024-03-02 DIAGNOSIS — R079 Chest pain, unspecified: Secondary | ICD-10-CM

## 2024-03-02 DIAGNOSIS — I359 Nonrheumatic aortic valve disorder, unspecified: Secondary | ICD-10-CM
